# Patient Record
Sex: Female | Born: 1975
Health system: Western US, Academic
[De-identification: ages and names within clinical notes are randomized; demographics above are authoritative.]

---

## 2013-04-28 DIAGNOSIS — Q201 Double outlet right ventricle: Secondary | ICD-10-CM

## 2013-04-28 DIAGNOSIS — Z950 Presence of cardiac pacemaker: Secondary | ICD-10-CM

## 2013-04-28 DIAGNOSIS — Z9889 Other specified postprocedural states: Secondary | ICD-10-CM

## 2013-04-28 DIAGNOSIS — Q204 Double inlet ventricle: Secondary | ICD-10-CM

## 2013-04-28 DIAGNOSIS — I27849 S/P Fontan procedure: Secondary | ICD-10-CM

## 2013-04-28 DIAGNOSIS — Z8774 Personal history of (corrected) congenital malformations of heart and circulatory system: Secondary | ICD-10-CM

## 2017-02-05 ENCOUNTER — Ambulatory Visit: Payer: BLUE CROSS/BLUE SHIELD

## 2017-03-12 ENCOUNTER — Ambulatory Visit: Payer: PRIVATE HEALTH INSURANCE

## 2017-03-19 ENCOUNTER — Ambulatory Visit: Payer: BLUE CROSS/BLUE SHIELD

## 2017-03-19 ENCOUNTER — Telehealth: Payer: BLUE CROSS/BLUE SHIELD

## 2017-03-20 ENCOUNTER — Inpatient Hospital Stay: Payer: BLUE CROSS/BLUE SHIELD | Attending: Pediatric Cardiology

## 2017-03-20 ENCOUNTER — Telehealth: Payer: BLUE CROSS/BLUE SHIELD

## 2017-03-21 ENCOUNTER — Ambulatory Visit: Payer: BLUE CROSS/BLUE SHIELD

## 2017-03-25 ENCOUNTER — Telehealth: Payer: BLUE CROSS/BLUE SHIELD

## 2017-04-08 MED ORDER — SPIRONOLACTONE 25 MG PO TABS
ORAL_TABLET | 3 refills | Status: SS
Start: 2017-04-08 — End: 2017-08-09

## 2017-04-16 ENCOUNTER — Ambulatory Visit: Payer: BLUE CROSS/BLUE SHIELD

## 2017-04-24 ENCOUNTER — Inpatient Hospital Stay: Payer: BLUE CROSS/BLUE SHIELD | Attending: Pediatric Cardiology

## 2017-05-04 ENCOUNTER — Ambulatory Visit: Payer: BLUE CROSS/BLUE SHIELD

## 2017-05-06 ENCOUNTER — Ambulatory Visit: Payer: BLUE CROSS/BLUE SHIELD

## 2017-05-07 ENCOUNTER — Ambulatory Visit: Payer: BLUE CROSS/BLUE SHIELD

## 2017-05-07 ENCOUNTER — Ambulatory Visit: Payer: BLUE CROSS/BLUE SHIELD | Attending: Gynecology

## 2017-05-07 ENCOUNTER — Ambulatory Visit: Payer: BLUE CROSS/BLUE SHIELD | Attending: Cardiovascular Disease

## 2017-05-07 DIAGNOSIS — N9089 Other specified noninflammatory disorders of vulva and perineum: Secondary | ICD-10-CM

## 2017-05-07 DIAGNOSIS — Z9889 Other specified postprocedural states: Secondary | ICD-10-CM

## 2017-05-07 DIAGNOSIS — Z87410 Personal history of cervical dysplasia: Secondary | ICD-10-CM

## 2017-05-07 DIAGNOSIS — Z30431 Encounter for routine checking of intrauterine contraceptive device: Secondary | ICD-10-CM

## 2017-05-17 ENCOUNTER — Ambulatory Visit: Payer: BLUE CROSS/BLUE SHIELD

## 2017-05-21 ENCOUNTER — Ambulatory Visit: Payer: BLUE CROSS/BLUE SHIELD

## 2017-05-21 DIAGNOSIS — I2721 Secondary pulmonary arterial hypertension: Secondary | ICD-10-CM

## 2017-05-21 MED ORDER — SILDENAFIL CITRATE 20 MG PO TABS
20 mg | ORAL_TABLET | Freq: Three times a day (TID) | ORAL | 3 refills | Status: AC
Start: 2017-05-21 — End: 2018-06-09

## 2017-05-22 ENCOUNTER — Inpatient Hospital Stay: Payer: BLUE CROSS/BLUE SHIELD | Attending: Pediatric Cardiology

## 2017-06-13 ENCOUNTER — Telehealth: Payer: BLUE CROSS/BLUE SHIELD

## 2017-06-19 ENCOUNTER — Inpatient Hospital Stay: Payer: BLUE CROSS/BLUE SHIELD | Attending: Pediatric Cardiology

## 2017-07-09 ENCOUNTER — Telehealth: Payer: BLUE CROSS/BLUE SHIELD

## 2017-07-24 ENCOUNTER — Inpatient Hospital Stay: Payer: BLUE CROSS/BLUE SHIELD | Attending: Pediatric Cardiology

## 2017-08-06 NOTE — H&P
[facility]    Reason for Visit: Routine follow up for DORV s/p Fontan conversion to extracardiac baffle on 11/21/07, and subsequent placement of epicardial permanent pacemaker.     HPI: Ann Duran is a 41 y.o. woman with the following cardiac history:  1. 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  2. Underwent modified Fontan procedure (patch connection of the IVC/SVC via the right atrium to MPA) at age 89 yrs at the [facility].  3. Postoperative chylothorax [phone], subsequently requiring exploration by a left thoracotomy, with ligation of lymphatic vessel and thoracic duct.  4. Suffered fatigue and exercise intolerance in November 2008, and was found to be in atrial fibrillation.  5. Underwent cardioversion at [facility] by Dr. Charlesetta Garibaldi on 10/14/07, along with cardiac cath which revealed excellent RA/Fontan pressures with a mean of 12-25mmHg.  6. Underwent Fontan conversion on 11/21/07, involving takedown of previous Fontan and placement of an extracardiac Fontan and bidirectional Glenn shunt and placement of epicardial permanent pacing leads. Discharged on 11/29/07  7. Re-admitted on 12/03/07 for thoracentesis of large right pleural effusion, in the setting of bradycardia and junctional rhythm  8. Underwent surgical placement of abdominal pacemaker generator on 12/05/07, but atrial lead was non-capturing, left with single site ventricular pacing.  9. Discharged on 12/12/07, after aggressive diuresis in the setting of significant ascites and perineal edema.  10. Subsequent diuresis over the next 6 weeks with resolution of ascites on moderate dose lasix and moderate dose aldactone.  11. underwent left hemidiaphragm plication by Dr. Larwance Sachs on 03/02/2009, unsuccessful attempt at placement of an atrial lead.  12. PPM rate set at VVI 90 bpm during the hospitalization, reduced to 60 bpm by Dr. Carollee Herter on 03/29/2009. 13. In the setting of poor chronotropic response to exercise, rate response activated by Dr. Carollee Herter in July 2011 with significantly improved exercise ability.  14. Underwent placement of Mirena IUD and D & C by Dr. Ardyth Man Parvatenini in February 2012. No cardiovascular complications.  Mirena IUD replaced with LEEP procedure in January 2018  15. Good functional capacity with MVO2 of 24.8 (88% predicted) in August 2016.  Low normal single RV function, improves with exercise.  No exercise induced arrhythmias  16. V-paced 99% of time at rate of 70, generator nearing ERI as of May 2018 remote check, requiring monthly checks to detect ERI.     INTERVAL HISTORY: Ann Duran was last seen on 05/2017. At that time she was doing well. She is now being scheduled for a routine Fontan surveillance cath. Her last cath was in 2008. She is otherwise doing well. She has no issues. She is tolerating the increased sildenafil dose without issues.       Past Medical History: As above.  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 Darlyn Chamber here at [facility].  LEEP in Jan 2018.  Mirena IUD replaced in Jan 2018.  Nose bleeds s/p bipolar cauterization 10/2016    Allergies: No Known Allergies     Medications:   No outpatient prescriptions have been marked as taking for the 08/08/17 encounter [facility] Encounter).         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   Nonsmoker, occas ETOH.    FH: Mother and father in their 1s in good health. Siblings, she has1 brother who is in good health. There  is no history of congenital heart disease in the family.    Ros: Negative except as described above.    Physical Exam:  There were no vitals taken for this visit.  General: Pleasant thin woman, in NAD.  Skin: No rashes, no plethora. Sternotomy and left thoracotomy scars well healed. Head and Neck: Pupils are equal and reacting. Mouth: Pale oral mucosa. Excellent dentition  Neck:Jugular venous pressure not well seen.   Chest:  Lungs clear bilaterally   Heart: Single S1, Single s2, regular rate and rhythm, with no systolic murmurs or rubs. No diastolic murmurs or gallops.  Abdomen: Not distended, tympanic to percussion, nontender, liver not enlarged, no ascites noted.  Extremities: No edema, mild varicosity on inner thighs and lower right leg.  Neurologic: Alert and oriented x 4.  Psychologic: Normal mood and affect.      Imaging Data:  Echocardiogram:  05/07/17 PHYSICIAN INTERPRETATION:  CONOTRUNCAL ANATOMY: The cardiac structural malformations are consistent with a diagnosis of levo-transposition of the great arteries.  LEFT VENTRICLE: Visually estimated left ventricular ejection fraction 55-60%. Patient demonstrated normal sinus rhythm during echocardiogram. 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  RIGHT VENTRICLE: Double out right ventricle with hypoplastic LV and large VSD with '' functional single ventricle'' Right ventricular hypertrophy. Low normal systemic RV systolic function.  RIGHT ATRIUM: Right atrial pressure is estimated at 8 mmHg. Right atrial pressure is estimated at 8 mmHg. Large secundum ASD. Status post extracardiac Fontan. Visualized portions of the Fontan appear patent, no obvious fenestration noted. Sherrine Maples shunt   appears normal.  MITRAL VALVE: No evidence of mitral valve regurgitation.  TRICUSPID VALVE: Mild tricuspid valve regurgitation.  AORTIC VALVE: The aortic valve was not well visualized. Aorta is anterior of the pulmonic valve.  No evidence of aortic regurgitation.  PULMONIC VALVE: No indication of pulmonic valve regurgitation. Pulmonic is posterior of the aorta.  AORTA: Normal aortic arch and normal descending aorta. The ascending aorta was not well visualized. Mildly enlarged aortic root (4.2 cm). No coarctation of the aorta. PULMONARY ARTERY: The pulmonary artery is not well seen.  SYSTEMIC VEINS: The inferior vena cava is normal in size and exhibits less than 50% respiratory change.  PERICARDIUM: There is no evidence of pericardial effusion.  CONCLUSIONS:   1. L-transposition of the great arteries.   2. Double outlet right ventricle-doubly commited ventricular septal defect-aorta anterior to pulmonary artery-no left ventricular outflow tract obstruction.   3. 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 Low normal systemic RV systolic function.   4. Large secundum ASD. Status post extracardiac Fontan. Visualized portions of the Fontan appear patent, no obvious fenestration noted. Sherrine Maples shunt appears normal.   5. Left ventricular ejection fraction is approximately 55-60%.   6. Mildly enlarged aortic root (4.2 cm).   7. Mild tricuspid valve regurgitation.   8. A prior echo performed on 03/09/2016 was reviewed for comparison. No significant changes noted since the previous study.   9. 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     ECG 11/07/16: Ventricular paced. Ventricular rate of 75 bpn, QRS duration 184 ms. QT/QTc 498/556 ms    Liver Ultrasound 07/27/16 per report from OSH: Liver 17.5 cm in length. It is normal in echodensity. Liver contour is smooth. No focal lesion os identified. Hepatopedal flow is identifies in the main, right, and left portal veins. Gallbladder and biliary tree: There are several small mobile gallstones. There is no focal tenderness.  The gallbladder wall is thickened measuring 0.7 cm. There is no pericholecystic fluid. No intra or extrahepatic biliary ductal dilatation.      Echocardiogram 03/09/16: CONOTRUNCAL ANATOMY: The cardiac structural malformations are consistent with a diagnosis of levo-transposition of the great arteries. The observed structural malformations are consistent with the diagnosis of double outlet right ventricle with doubly???commited ventricular septal defect. The aorta is anterior to the pulmonary artery. There is no sub-aortic obstruction of left ventricular outflow tract. LEFT VENTRICLE: Normal left ventricular size. Visually estimated left ventricular ejection fraction 55-60%. Small left ventricular size. RIGHT VENTRICLE: (DTI 6.0 cm/s). 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 Low normal systemic RV systolic function. LEFT ATRIUM: Mild left atrial enlargement. RIGHT ATRIUM: Right atrial pressure is estimated at 8 mmHg. Large secundum ASD. Status post extracardiac Fontan. Visualized portions of the Fontan appear patent, no obvious fenestration noted. Sherrine Maples shunt appears normal. MITRAL VALVE: No evidence of mitral valve regurgitation. TRICUSPID VALVE: Mild tricuspid valve regurgitation. AORTIC VALVE: The aortic valve is trileaflet and structurally normal, with normal leaflet excursion. Aorta is anterior of the pulmonic valve. No evidence of aortic regurgitation. PULMONIC VALVE: No indication of pulmonic valve regurgitation. Pulmonic is posterior of the aorta. AORTA: Normal mid ascending aorta and normal aortic arch. Mildly enlarged aortic root (3.9 cm). SYSTEMIC VEINS: The inferior vena cava is normal in size and exhibits less than 50% respiratory change. PERICARDIUM: There is no evidence of pericardial effusion. ?????? CONCLUSIONS: ???1. 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 Low normal systemic RV systolic function. ???2. Left ventricular ejection fraction is approximately 55-60%. ???3. Double outlet right ventricle. ???4. Double outlet right ventricle ????????? -doubly commited ventricular septal defect ????????? -aorta anterior to pulmonary artery ????????? -no left ventricular outflow tract obstruction. ???5. L-transposition of the great arteries. ???6. Mild left atrial enlargement. ???7. Large secundum ASD. Status post extracardiac Fontan. Visualized portions of the Fontan appear patent, no obvious fenestration noted. Sherrine Maples shunt appears normal. ???8. Mild tricuspid valve regurgitation. ???9. Mildly enlarged aortic root (3.9 cm).    Abdominal US 06/21/15:  The pancreas is partially visualized and grossly unremarkable. The liver is mildly heterogeneous in echogenicity. There is no focal liver lesion. Portal vein demonstrates hepatopetal flow. No intra- or extrahepatic biliary dilation. The common bile duct is normal in caliber. The gallbladder is normally distended, containing gallstones. No gallbladder wall thickening or pericholecystic fluid. No sonographic Murphy's sign. The spleen is mildly enlarged. The kidneys are normal in size. Both kidneys demonstrate normal cortical thickness and echogenicity. No hydronephrosis. There is no ascites. The visualized proximal aorta and IVC are unremarkable. MEASUREMENTS: Liver: 15.1 cm Common Duct: 3 mm Right Kidney: 10.5 cm Left Kidney: 11.5 cm Spleen: 17 cm Aorta: 1.1 cm SHEAR WAVE LIVER STIFFNESS MEASUREMENTS: Mean 1.63 +/-0.12 m/sec, Median 1.66 m/sec, equating to 5.7-12 kPA. REFERENCE (m/s, kPa): Normal: 0.81-1.22 m/s 2.0-4.5 kPa (METAVIR F0) Normal/mild: 1.22-1.37 m/s 4.5-5.7 kPa (METAVIR F0-F1) Mild/moderate: 1.37-2 m/s 5.7-12.0 kPa (METAVIR F2-F3) Moderate/severe: 2-2.64 m/s 12.0-21.0 kPa (METAVIR F3-F4) Severe: >2.64 m/s >21.0 kPa (METAVIR F4) IMPRESSION: 1. Heterogenous liver echogenicity, suggesting diffuse liver disease. No suspicious liver lesions. Patent hepatic vasculature. 2. Shear wave liver stiffness measurement indicating mild/moderate liver fibrosis, MetaVir score of F2-3. 3. Splenomegaly. 4. Cholelithiasis without evidence of acute cholecystitis.    Echocardiogram 06/21/15:  2D AND M-MODE MEASUREMENTS (normal ranges within parentheses): Aorta/Left Atrium: Aortic Root,  d (2D): 2.8 cm LV DIASTOLIC FUNCTION: MV Peak E: 4.54 m/s E/e' Ratio: 40.2 LV IVRT: 134 msec Decel Time: 229 msec Right Ventricle: TAPSE: 1.0 cm Aortic Valve: AoV Max Vel: 1.05 m/s AoV Peak PG: 4 mmHg AoV Mean PG: Tricuspid Valve and PA/RV Systolic Pressure: TR Max Velocity: 3.2 m/s RA Pressure: 8 mmHg RVSP/PASP: 49 mmHg Pulmonic Valve: PV Max Velocity: 0.8 m/s PV Max PG: 2 mmHg PV Mean PG: Aorta: Ao Asc: 2.4 cm. PHYSICIAN INTERPRETATION: CONOTRUNCAL ANATOMY: The cardiac structural malformations are consistent with a diagnosis of levo-transposition of the great arteries. 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 The aorta is anterior to the pulmonary artery. There is no sub-aortic obstruction of left ventricular outflow tract. LEFT VENTRICLE: Visually estimated left ventricular ejection fraction 55-60%. Small left ventricular size. RIGHT VENTRICLE: TAPSE 1.0 cm, (DTI 6.0 cm/s). 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 Low normal systemic RV systolic function. RIGHT ATRIUM: Right atrial pressure is estimated at 8 mmHg. Large secundum ASD. Status post extracardiac Fontan. Visualized portions of the Fontan appear patent, no obvious fenestration noted. Unable to obtain images of Glenn shunt. MITRAL VALVE: No evidence of mitral valve regurgitation. TRICUSPID VALVE: Mild tricuspid valve regurgitation. Tricuspid regurgitation velocity is not well seen. AORTIC VALVE: The aortic valve is trileaflet and structurally normal, with normal leaflet excursion. Aorta is anterior of the pulmonic valve. No evidence of aortic regurgitation. PULMONIC VALVE: No indication of pulmonic valve regurgitation. Pulmonic is posterior of the aorta. AORTA: The aortic root is normal in size and structure, normal mid ascending aorta and normal aortic arch. SYSTEMIC VEINS: The inferior vena cava is not well visualized. PERICARDIUM: There is no evidence of pericardial effusion. CONCLUSIONS: 1. Small left ventricular size 2. 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 3. L-transposition of the great arteries. 4. Mild tricuspid valve regurgitation. 5. 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 Low normal systemic RV systolic function.     CPEX/Echo 06/11/15:  BASELINE: Low normal systemic RV systolic function at rest. Hypoplastic LV. ADDITIONAL BASELINE FINDINGS: MITRAL VALVE: Trace mitral valve regurgitation. TRICUSPID VALVE: Trace tricuspid valve regurgitation. No new segmental wall motion abnormalities were seen. Global left ventricular systolic function at peak stress is normal (LVEF 60-65%). Improved systemic RV systolic function with stress. No increase in degree of atrioventricular valve regurgitation with exercise. SUMMARY: 1. Improved systemic RV systolic function with stress. No increase in degree of atrioventricular valve regurgitation with exercise. 2. Low normal systemic RV systolic function at rest. Hypoplastic LV. BASELINE ECG: Ventricular paced rhythm PROTOCOL: Bruce. CONTROL: HR: 74 BP: 120/74 O2 Sat: 95 % START: 1.7 MPH at 10% grade. STOP: 12.7 METS with 4.2 mph at 16 % grade x 1???33'' after 10???33'' total exercise time due to shortness of breath. Peak HR: 141 Peak BP: 136/56 Peak O2 Sat: 95 % Peak Double-Product: 19,176 Maximum VO2 = 1386 ml which is 77 % of predicted, maximum VO2/kg = 24.8 ml/kg which is 88 % of predicted (Note: The reference values are adjusted for weight and modality of exercise) VE/VO2 = 35 VE/VCO2 = 31 VE/VCO2 slope = 23.1 VO2/HR = 9.8 which is 99 % predicted Breathing Reserve = 50 RQ= 1.13 Anaerobic Threshold was reached at HR =  103 VO2 = 1094 ml VO2/kg = 19.6 ml/kg RESULTS: Symptoms: Shortness of breath and leg fatigue; no chest pain or discomfort. ST-T Changes: Uninterpretable due to paced rhythm. Dysrhythmias: Rare PVC during exercise BP Response: Appropriate. IMPRESSIONS: Good exercise tolerance achieving 77 % maximum predicted heart rate, limited by shortness of breath. Exercise induced ST changes are uninterpretable due to paced rhythm. No exercise induced chest pain at a low double product. Rare PVCs as described above. Maximum VO2 = 1386 ml which is 77 % of predicted, maximum VO2/kg = 24.8 ml/kg which is 88 % of predicted . VE/VCO2 slope = 23.1 which is 92 % of predicted. Compared with previous stress/CPX of 04/24/13 maximum VO2/kg had been 25.0 at 11.9 METS    Echocardiogram:  04/21/13: CONCLUSIONS:  1. Double outlet right ventricle.  2. Moderately enlarged right ventricular size and low normal systolic function. 3. Moderately increased RV wall thickness. 4. Mild tricuspid regurgitation. 5. Status post extracardiac Fontan, appears widely patent, no fenestration noted. Sherrine Maples shunt not well visualized.    SE/CPX:  04/21/13: BASELINE ECG: Ventricular paced rhythm. PROTOCOL: Bruce. CONTROL: HR: 77 BP: 106/76 O2 Sat: 99%.  START: 1.7 MPH at 10% grade. STOP: 11.9 METS with 4.2 mph at 16% grade x 1'09'' after 10'09'' total exercise time due to shortness of breath. Peak HR: 151 Peak BP: 144/80 Peak O2 Sat: 95%. Peak Double-Product: 21,744.  Maximum VO2 = 1404 ml, maximum VO2/kg = 25.1 ml/kg which is 88% of predicted (Note: The reference values are adjusted for weight and modality of exercise - normal weight). VE/VO2 = 37, VE/VCO2 = 34, VE/VCO2 slope = 24.8 (99% predicted), VO2/HR = 9.5 ml which is 95% predicted, Breathing Reserve = 47%, RQ=1.10. Anaerobic threshold was reached at 6' exercise, HR =108, VO2 = 1098 ml, VO2/kg = 19.6 ml/kg. RESULTS:  Symptoms: Shortness of breath, leg fatigue; denied lightheadedness prior to and during exercise. ST-T Changes: No significant changes. Dysrhythmias: None. BP Response: Appropriate.  IMPRESSIONS: Good exercise tolerance, NYHA functional class I (MVO2 = 25.1 ml/kg, 88% predicted), achieving 81% maximum predicted heart rate, limited by shortness of breath. No diagnostic ST changes in the presence of ventricular paced rhythm. No chest pain or dysrhythmias at a moderate double product. VE/VCO2 slope = 24.8 which is 99% of predicted. Compared with previous stress/CPX of 04/22/2012, increased exercise tolerance from 10.1 METS, MVO2 increased from 23.3 ml/kg. RIGHT VENTRICLE: . Global RV systolic function is low normal. Estimated right ventricular ejection fraction is 50%. POST STRESS: Global left ventricular function increased appropriately with stress. No new segmental wall motion abnormalities were seen. Estimated right ventricular ejection fraction is 60 %. SUMMARY: 1. Appropriate improvement in right ventricular systolic function with stress.    CPET-echo 04/22/12:  BASELINE: Double outlet right ventricle with hypoplastic left vetnricle and large VSD with double inlet functional singel ventricle of RV morphology. Resting EF estimated at 45-50%. ADDITIONAL BASELINE FINDINGS: MITRAL VALVE: Trace mitral valve regurgitation. TRICUSPID VALVE: Trace tricuspid regurgitation. No new segmental wall motion abnormalities were seen. Global left ventricular systolic function at peak stress is imporved (RVEF>50%). Patent Fontan, mild atrioventricular valve regurgitation, normal aortic flow.BASELINE ECG: Ventricular paced rhythm. PROTOCOL: Bruce. CONTROL: HR: 76 BP: 120/80 O2 Sat: 94% START: 1.7 MPH at 10% grade. STOP: 10.10 METS with 3.4 mph at 14 % grade x 3??? after 9??? total exercise time due to shortness of breath and leg fatigue Peak HR: 137 Peak BP: 136/74 Peak O2 Sat: 95% Peak Double-Product: 18,632.  Maximum VO2 = 1323  ml, maximum VO2/kg = 23.3 ml/kg which is 80 % of predicted (Note: The reference values are adjusted for weight and modality of exercise). VE/VO2 = 34 VE/VCO2 = 30 VE/VCO2 slope = 24.7.  VO2/HR =9.8 which is 97 %predicted Breathing Reserve = 55 RQ= 1.13.  Anaerobic Threshold was reached at HR = 97 VO2 = 1141 ml VO2/kg = 20.1 ml/kg after of exercise.  RESULTS: Symptoms: Shortness of breath and leg fatigue; denied chest pain or discomfort. ST-T Changes: No significant changes. Dysrhythmias: None.  BP Response: Appropriate. IMPRESSIONS: Good exercise tolerance achieving 72 % maximum predicted heart rate, limited by shortness of breath and leg fatigue. No diagnostic ST change in the presence of baseline paced rhythm. No chest pain or dysrhythmias at a low double product. Maximum VO2/kg = 23.3 ml/kg which is 80 % of predicted. VE/VCO2 slope = 24.7 which is 100 % of predicted. Compared with previous stress/CPX of 04/13/2011, exercise tolerance are Unchanged. MVO2 decreased from 26.6 ml/kg.    Echocardiogram: 04/11/11: FINDINGS :1. Double outlet right ventricle with hypoplastic left ventricle and large VSD consistent with ''functionally single right ventricle'' with double inlet. Right ventricular hypertrophy ; estimated right ventricular ejection fraction = 50% 2. Extracardiac Fontan not well visualized but Doppler interrogation of the IVC demonstrates normal venous flow pattern with inspiratory accentuation, the SVC Sherrine Maples) is not well visualized. 3. Normal atrial sizes with a large ASD 4.  Aorta is anterior with a peak aortic valve velocity 0.9 m/sec. No aortic regurgitation. 5. Normal bilateral atrioventricular valves with normal motion . E velocity 63 cm/sec; peak A velocity 29 cm/sec; Isovolumicrelaxation time 109 msec. Deceleration time 229 msec. No left atrioventricular valve regurgitation, mild right atrioventricular valve regrugitation. 6. No pericardial effusion.  CONCLUSIONS. 1. Double outlet right ventricle with transposed great arteries and large VSD s/p extracardiac Fontan conversion. 2. Stable ventricular systolic function, estimated ejection fraction is 50%, unchanged from prior. 3. Mild right atrioventricular valve regurgitation.    Stress Echocardiogram: 04/11/11: PRE-EXERCISE STUDY REVEALS: Single right ventricle with normal wall motion. ESTIMATED RESTING RVEF = 50% POST-EXERCISE STUDY REVEALS: Improved systolic function with exercise. ESTIMATED RVEF = 60%. CONCLUSION: Appropriate single ventricular response to stress, no evidence of stress induced Ischemia.  CPX treadmill stress testing: 04/11/11: BASELINE ECG: Ventricular paced rhytm PROTOCOL: Bruce. CONTROL: HR: 79 BP: 102/62 O2 Sat: 93 % START: 1.7 MPH at 10% grade. STOP: 10.10 METS with 3.4 mph at 14 % grade x 3' after 9' total exercise time due to shortness of breath and leg fatigue Peak HR: 116 Peak BP: 130/68 Peak O2 Sat: 94% Peak Double-Product: 15,080 Maximum VO2 = 1477 ml, maximum VO2/kg = 26.6 ml/kg which is 91 % of predicted (Note: The reference values are adjusted for weight and modality of exercise) VE/VO2 = 32 VE/VCO2 = 32 VE/VCO2 slope = 23.2 VO2/HR =12.7 which is 127 %predicted Breathing Reserve = 55 RQ= 0.99 Anaerobic Threshold was reached at a VO2/kg of 25.7 ml/kg, HR of 116 bpm after 8 mins and 40 seconds of exercise. RESULTS: Symptoms: 4/10 head pounding which relieved by 4' recovery, shortness of breath and leg fatigue; denied chest pain or lightheadedness ST-T Changes: Uninterpretable due to paced rhythm. Dysrhythmias: None BP Response: Appropriate. IMPRESSIONS: Fair exercise tolerance achieving 62 % maximum predicted heart rate, limited by shortness of breath and leg fatigue. No diagnostic ST changes in the presence of baseline pace rhythm. No chest pain or dysrhythmias at a low double product. Maximum VO2/kg = 26.6 ml/kg which is 91 %  of predicted. VE/VCO2 slope = 23.2 which is 94 % of predicted. Compared with previous stress/CPX of 04/21/2010 exercise duration slightly decreased and MVO2 increased from 20.7 ml/kg.    ECHOCARDIOGRAM 04/21/2010: FINDINGS 1. Double outlet right ventricle with hypoplastic left ventricle and large VSD consistent with ''functionally single ventricle'' with double inlet; estimated right ventricular ejection fraction = 50%. Anterior aorta with normal arch. Posterior hypoplastic and oversewn pulmonary artery; venous flow profile in right pulmonary artery. 2. Lateral tunnel Fontan partially visualized. Normal superior vena cava flow profile (though superior vena cava to pulmonary artery connection not well seen); 3. Normal atrial sizes with large ASD. 4. Aorta is anterior Peak aortic valve velocity 1.4 m/sec. No aortic regurgitation. 5. Normal bilateral atrioventricular valves with normal motion, Mild right atrioventricular valve regurgitation. Trace left atrioventricular valve regurgitation. 6. No pericardial effusion. CONCLUSIONS. 1. Double outlet right  ventricle with single ventricle physiology. 2. Stable right ventricular systolic function. 3. Mild atrioventricular valve regurgitation. 4. Fontan not well seen.    STRESS ECHO/CPX 04/21/2010: BASELINE ECG: Ventricular paced PROTOCOL: Bruce. CONTROL: HR: 80 BP: 116/76 O2 Sat: 95% START: 1.7 MPH at 10% grade. STOP: 10.5 METS with 4.2 mph at 16% grade x 0.25' after 9.25' total exercise time due to shortness of breath. Peak HR: 86 Peak BP: 144/86 Peak O2 Sat: 96% Peak Double-Product: 12,384 Maximum VO2 = 1177 ml, maximum VO2/kg = 20.7 ml/kg which is 70 % of predicted (Note: The reference values are adjusted for weight and modality of exercise) VE/VO2 = 40, VE/VCO2 = 35, VE/VCO2 slope = 29 (118% predicted), VO2/HR = 13.7 ml which is 134% predicted, Breathing Reserve = 56, RQ = 1.15 Anaerobic threshold was reached at 5.5' exercise, HR = 85, VO2 = 989 ml, VO2/kg = 17.4 ml/kg. RESULTS: Symptoms: Shortness of breath, leg fatigue. ST-T Changes: No significant  changes. Dysrhythmias: Rare PVCs before exercise. BP Response: Appropriate. IMPRESSIONS: Good exercise tolerance achieving 46% maximum predicted heart rate, limited by shortness of breath. No diagnostic ST changes in the presence of baseline paced rhythm. No chest pain at a low double product. Rare PVCs before exercise. Maximum VO2/kg = 20.7 ml/kg which is 70% of predicted. VE/VCO2 slope = 29 which is 118% of predicted (abnormal). Compared with previous stress/CPX of 10/05/2009, MVO2 is increased from 18.9 ml/kg. Pre-Exercise Study Reveals: Mild global hypokinesis, mild right atrioventricular valve regurgitation, Trace left atrioventricular valve regurgitation ESTIMATED RESTING EF = 45-50% Post-Exercise Study Reveals: No new or worsening wall motion abnormalities, ESTIMATED EF = > 50% Conclusion: Appropriate increase in ejection fraction with exercise.    Pacemaker Interrogation: 05/30/2010 - Intrinsic rhythm: Junctional rhythm 43 bpm R wave: 10.0 mV Battery: 2.79 V Battery Impedance: 180 ohms Ventricular lead impedance:599 ohms Ventricular capture threshold: 1.25 V @ 0.52 msec Event summary since 11/22/09: Vs: 1.0% Vp: 99.0% Programmed Changes Lower Rate: 60 ppm ==> 70 ppm Upper Sensor Rate: 115 ppm ==> 150 ppm Sleep Rate: 55 ppm Rate Response: ADL Rate: 85 ppm ==> 90 ppm ADL Response: 4 ==> 5  Exertion Response: 3 ==> 4 ADLR Percent: 4.0% ==> 7.0% High Rate Percent: 0.2% ==> 0.5% ADL Rate Setpoint: 8 ==> 5  Upper Rate Setpoint: 125 ==> 121 Sleep Rate: 45 ppm ==> 55 ppm Programmed parameters: Mode: VVIR Rate: 60 / 115 ppm Sleep Rate: 55 ppm Bedtime: 2200 hours to 0600 hours Output: 3.0 V @ 0.52 msec Sensitivity: 1.40 mV Assessment: Patient is not pacemaker-dependent at this time. Normal pacemaker function.    Cath 10/14/2007:  HEMODYNAMICS:  1. No gradients across pulmonary arteries or in Fontan conduit. 2. Low transpulmonary gradient ( ).  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  2) Atrial fibrillation s/p successful cardioversion to sinus rhythm with first degree AV block and likely sick  sinus syndrome.  3) Favorable Fontan hemodynamics with low transpulmonary gradient and low pulmonary capillary wedge  Pressures.      LABWORK:      07/26/16: WBC 3.0, HGB 4.69, HCT 40.7, PLT 75, NA 137, K 3.8, BUN 13, CREAT 0.7, ALK PHOS 57, AST 30, ALT 38, TBil 1.3, GGT 100, PREALB 24, BNP 113, INR 1.2, TSH 4.24, FT3 3.6, FT4 1.05.  04/01/13:  Hgb 14.7, Hct 42.4, MCV 85.1, plat ct 106k, Na 143, K 4.2, creat 0.8, BUN 19, AST 36, ALT 32    Impressions:  1. DORV (although recent echos appear to show a DILV with single LVEF 50%, and earlier surgical notes describe her anatomy as DILV), L-malposed great arteries: s/p RA-PA anastomosis Fontan at age 53 years, now s/p Extracardiac Fontan and bidirectional Sherrine Maples shunt on 11/21/07 by Dr. Richardo Hanks, including bi-atrial Maze procedure and placement of a permanent atrial pacing lead.  2. Postoperative junctional rhythm and bradycardia: re-admitted 4 days after discharge with large right pericardial effusion requiring thoracentesis and placement of an abdominal pacemaker generator and epicardial ventricular lead on 12/05/07 (non-functioning atrial lead).  3. Ascites, improved on aldactone. Intermittent abdominal bloating depending on salt intake/stress, but no evidence of increased volume load or ascites on exam today.  4. Longstanding history of irregular and heavy menses, thus far unsuccessfully regulated on several types of hormonal therapy. Better controlled on Mirena since 2012.  Also has cervical HPV.  Now with CIN III, underwent LEEP procedure in Jan 2018  5. History of atrial fibrillation, but no post Fontan revision tachyarrhythmias thus far, formerly on coumadin (possible side effect of hair loss), now on ASA.  6. Longstanding elevated left hemidiapragm, s/p successful plication on 03/02/2009 by Dr. Larwance Sachs.  7. Liver US in 2014, 2016 and 2017 shows no lesions  8. Ventricular pacemaker, pacing 99% of time, nearing ERI as of May 2018.    Recommendations:   1. Proceed with RLHC, TEE, Liver biopsy      Estimated Risk of Major Adverse Events after Catheterization in [facility] Patients         Points Received For:  Age 44-40    1  Pre-procedural O2 sat <96%    2    Total Points: 3  Estimated Risk of MAE: 1-2%    A.C. Stefanescu Noreene Filbert et al. Prediction of adverse events after catheter-based procedures in adolescents and adults with congenital heart disease in the IMPACT registry. European Heart Journal (2017) 38, 2070???2076.      The patient was seen and discussed with the [facility] Attending, Dr. Tobie Poet    Author:  Monika Salk, MD, MPH  08/06/2017  1:56 PM    I have seen and examined the patient and I agree with the findings, assessment, and recommendations made above which we developed together and communicated with the patient.    CC:   Dr. Elmer Picker (please fax)  Dr. Danna Hefty  Dr. Franki Monte

## 2017-08-08 ENCOUNTER — Ambulatory Visit: Payer: BLUE CROSS/BLUE SHIELD

## 2017-08-08 ENCOUNTER — Inpatient Hospital Stay
Admit: 2017-08-08 | Discharge: 2017-08-09 | Disposition: A | Discharge status: Home / Self Care | Payer: BLUE CROSS/BLUE SHIELD | Source: Home / Self Care | Attending: Cardiovascular Disease

## 2017-08-08 DIAGNOSIS — Z9889 Other specified postprocedural states: Secondary | ICD-10-CM

## 2017-08-08 DIAGNOSIS — Q201 Double outlet right ventricle: Secondary | ICD-10-CM

## 2017-08-08 DIAGNOSIS — K740 Hepatic fibrosis: Secondary | ICD-10-CM

## 2017-08-08 DIAGNOSIS — Q204 Double inlet ventricle: Secondary | ICD-10-CM

## 2017-08-08 DIAGNOSIS — Z950 Presence of cardiac pacemaker: Secondary | ICD-10-CM

## 2017-08-08 LAB — WBC Count, ANES: WHITE BLOOD CELL COUNT: 3.7 10*3/uL — ABNORMAL LOW (ref 4.16–9.95)

## 2017-08-08 LAB — CREATININE: CREATININE: 0.72 mg/dL (ref 0.60–1.30)

## 2017-08-08 LAB — Sodium: SODIUM: 145 mmol/L (ref 135–146)

## 2017-08-08 LAB — Sodium,POC: SODIUM,POC: 138 mmol/L (ref 135–146)

## 2017-08-08 LAB — Total CO2: TOTAL CO2: 20 mmol/L (ref 20–30)

## 2017-08-08 LAB — Hematocrit: HEMATOCRIT: 42.2 (ref 34.9–45.2)

## 2017-08-08 LAB — CO2,POC: CO2,POC: 21 mmol/L (ref 20–30)

## 2017-08-08 LAB — Blood Gases, arterial,POC: PH, ARTERIAL,POC: 7.36 — ABNORMAL LOW (ref 7.37–7.41)

## 2017-08-08 LAB — APTT: APTT: 25.8 s (ref 21.9–29.3)

## 2017-08-08 LAB — Pregnancy Test,Urine: PREGNANCY TEST,URINE: NEGATIVE

## 2017-08-08 LAB — COOXIMETRY,POC: OXYHEMOGLOBIN,POC: 91.8 (ref 11.6–15.2)

## 2017-08-08 LAB — Chloride,POC: CHLORIDE,POC: 106 mmol/L (ref 96–106)

## 2017-08-08 LAB — Hemoglobin: HEMOGLOBIN: 13.7 g/dL (ref 11.6–15.2)

## 2017-08-08 LAB — Urea Nitrogen: UREA NITROGEN: 12 mg/dL (ref 7–22)

## 2017-08-08 LAB — Chloride: CHLORIDE: 102 mmol/L (ref 96–106)

## 2017-08-08 LAB — Glucose, Whole Blood: GLUCOSE, WHOLE BLOOD: 93 mg/dL (ref 65–99)

## 2017-08-08 LAB — Ionized Calcium,POC: IONIZED CA,CORR,POC: 1.15 mmol/L (ref 1.09–1.29)

## 2017-08-08 LAB — Potassium,POC: POTASSIUM,POC: 3.8 mmol/L (ref 3.6–5.3)

## 2017-08-08 LAB — Platelet Count: MEAN PLATELET VOLUME: 11.7 fL (ref 9.3–13.0)

## 2017-08-08 LAB — Calcium,Ionized: IONIZED CA++,UNCORRECTED: 1.18 mmol/L (ref 1.09–1.29)

## 2017-08-08 LAB — Glucose,POC: GLUCOSE,POC: 92 mg/dL (ref 65–99)

## 2017-08-08 LAB — Potassium: POTASSIUM: 3.8 mmol/L (ref 3.6–5.3)

## 2017-08-08 LAB — Prothrombin Time Panel: INR: 1.2 s (ref 8.5–11.1)

## 2017-08-08 MED ORDER — FUROSEMIDE 80 MG PO TABS
80 mg | ORAL_TABLET | Freq: Every day | ORAL | 3 refills | Status: AC
Start: 2017-08-08 — End: 2017-09-10

## 2017-08-08 MED ORDER — SPIRONOLACTONE 50 MG PO TABS
50 mg | ORAL_TABLET | Freq: Every day | ORAL | 3 refills | Status: AC
Start: 2017-08-08 — End: 2017-08-22

## 2017-08-08 MED ADMIN — PLASMA-LYTE A IV SOLN: INTRAVENOUS | @ 15:00:00 | Stop: 2017-08-08 | NDC 00338022104

## 2017-08-08 MED ADMIN — PLASMA-LYTE A IV SOLN: INTRAVENOUS | @ 18:00:00 | Stop: 2017-08-08

## 2017-08-08 MED ADMIN — PHENYLEPHRINE HCL 10 MG/ML IJ SOLN: INTRAVENOUS | @ 16:00:00 | Stop: 2017-08-08 | NDC 00641614225

## 2017-08-08 MED ADMIN — IODIXANOL 320 MG/ML IV SOLN: 160 mL | INTRAVENOUS | @ 18:00:00 | Stop: 2017-08-08 | NDC 00407222321

## 2017-08-08 MED ADMIN — ONDANSETRON HCL 4 MG/2ML IJ SOLN: INTRAVENOUS | @ 17:00:00 | Stop: 2017-08-08 | NDC 00641608025

## 2017-08-08 MED ADMIN — IODIXANOL 320 MG/ML IV SOLN: 10 mL | INTRAVENOUS | @ 17:00:00 | Stop: 2017-08-08 | NDC 00407222316

## 2017-08-08 MED ADMIN — ACETAMINOPHEN 10 MG/ML IV SOLN: @ 17:00:00 | Stop: 2017-08-08 | NDC 43825010201

## 2017-08-08 MED ADMIN — OXYCODONE HCL 5 MG PO TABS: 5 mg | ORAL | @ 20:00:00 | Stop: 2017-08-09 | NDC 00406055262

## 2017-08-08 MED ADMIN — PHENYLEPHRINE HCL 10 MG/ML IJ SOLN: INTRAVENOUS | @ 17:00:00 | Stop: 2017-08-08 | NDC 00641614225

## 2017-08-08 MED ADMIN — LIDOCAINE HCL (CARDIAC) 20 MG/ML IV SOLN: INTRAVENOUS | @ 15:00:00 | Stop: 2017-08-08 | NDC 00409132305

## 2017-08-08 MED ADMIN — ROCURONIUM BROMIDE 50 MG/5ML IV SOLN: INTRAVENOUS | @ 15:00:00 | Stop: 2017-08-08 | NDC 39822420002

## 2017-08-08 MED ADMIN — MIDAZOLAM HCL 10 MG/10ML IJ SOLN: INTRAVENOUS | @ 15:00:00 | Stop: 2017-08-08 | NDC 00409258705

## 2017-08-08 MED ADMIN — ROCURONIUM BROMIDE 50 MG/5ML IV SOLN: INTRAVENOUS | @ 16:00:00 | Stop: 2017-08-08 | NDC 39822420002

## 2017-08-08 MED ADMIN — FENTANYL CITRATE (PF) 100 MCG/2ML IJ SOLN: INTRAVENOUS | @ 15:00:00 | Stop: 2017-08-08 | NDC 00409909422

## 2017-08-08 MED ADMIN — OXYCODONE HCL 5 MG PO TABS: 10 mg | ORAL | @ 21:00:00 | Stop: 2017-08-09 | NDC 00406055262

## 2017-08-08 MED ADMIN — PROMETHAZINE IVPB: 6.25 mg | INTRAVENOUS | @ 18:00:00 | Stop: 2017-08-09 | NDC 39822552503

## 2017-08-08 MED ADMIN — PROPOFOL 200 MG/20ML IV EMUL: @ 15:00:00 | Stop: 2017-08-08 | NDC 63323026929

## 2017-08-08 MED ADMIN — SUGAMMADEX SODIUM 200 MG/2ML IV SOLN: @ 17:00:00 | Stop: 2017-08-08 | NDC 00006542312

## 2017-08-08 MED ADMIN — PHENYLEPHRINE HCL 10 MG/ML IJ SOLN: INTRAVENOUS | @ 15:00:00 | Stop: 2017-08-08 | NDC 00641614225

## 2017-08-08 MED ADMIN — DEXAMETHASONE SODIUM PHOSPHATE 4 MG/ML IJ SOLN: INTRAVENOUS | @ 15:00:00 | Stop: 2017-08-08 | NDC 63323016501

## 2017-08-08 NOTE — Brief Op Note
Brief Operative/Procedure Note    Patient: Ann Duran    Date of Operation(s)/Procedure(s): 08/08/2017    Pre-op Diagnosis: DORV (double outlet right ventricle) [Q20.1]  S/P Fontan procedure [Z98.890]  Pacemaker [Z95.0]  Liver fibrosis [K74.0]       Post-op Diagnosis: Fontan procedure, in a patient with DORV, L-TGA.     Operation(s)/Procedure(s):  RIGHT-LEFT HEART CATH  COIL EMBOLIZATION    Surgeon(s) and Role:     * Rashay Barnette, Duaine Dredge., MD - Primary    Anesthesia Staff and Role:  Anesthesiologist: Madison Hickman., MD    Anesthesia Type:   Anesthesia type not filed in the log.    Pre-Op Medications: None    Estimated Blood Loss: Minimal    Findings:   -Mildly elevated Fontan pressures ( ). No obstruction within the Fontan circuit.  -No evidence of portal hypetension (transhepatic wedge 1 mmHg)  -Normal LVEDP (9 mmHg)  -no gradient across the aortic valve  -minimal shunting with agitated saline study in LPA and RPA.   -large veno-venous collateral from innominate vein to coronary sinus.   -s/p liver biopsy    Complications: None; patient tolerated the operation(s)/procedure(s) well.                 Specimens:   ID Type Source Tests Collected by Time   1 :  Tissue Liver TISSUE EXAM/FOREIGN BODY (AP) Tiffiny Worthy A., MD 08/08/2017 1006     Drains:  None     Recommendations:  -continue all medications  -will increase spironolactone to 50mg  daily         Staff and Role:   Radiology Technologist: Aline August.  NICU MD: Monika Salk, MD, MPH    DWA Dr. Tobie Poet    Author:  Monika Salk, MD, MPH    Date: 08/08/2017  Time: 10:18 AM    The above procedural note accurately details the procedure performed, including the findings and complications.  I have reviewed the procedural note and I agree with the contents.  I supervised the performance of this procedure and was scrubbed in and present throughout the procedure.

## 2017-08-08 NOTE — Op Note
1045: received pt from Cath lab, awake, alert, no pain. Right neck dressing has a little blood stain, marked the site. Right groin dressing is dry and intact, pedal pulses are strong.    1135: noticed right   Neck stain is getting bigger, pressure applied.    1145: released manual pressure, notified cath lab.    1200: dr.Small is at bedside, ok to change neck dressing. Pt refused visitor.

## 2017-08-08 NOTE — H&P
UPDATED H&P REQUIREMENT    For [place] [facility] and [facility]    WHAT IS THE STATUS OF THE PATIENT'S MOST CURRENT HISTORY AND PHYSICAL?   - The most current H&P was performed within the past 24 hours. No additional updated H&P documentation is necessary.     REFER TO MEDICAL STAFF POLICIES REGARDING PRE-PROCEDURE HISTORY AND PHYSICAL EXAMINATION AND UPDATED H&P REQUIREMENTS BELOW:    [facility] and [facility] Medical Staff Policy 200 - For Patients Undergoing Procedures Requiring Moderate or Deep Sedation, General Anesthesia or Regional Anesthesia    Contents of a History and Physical Examination (H&P):    The H&P shall consist of chief complaint, history of present illness, allergies and medications, relevant social and family history, past medical history, review of systems and physical examination, and assessment and plan appropriate to the patient's age.    For Patients Undergoing Procedures Requiring Moderate or Deep Sedation, General Anesthesia or Regional Anesthesia:    1. An H&P shall be performed within 24 hours prior to the procedure by a qualified member of the medical staff or designee with appropriate privileges, except as noted in item 2 below.    2. If a complete history and physical was performed within thirty (30) calendar days prior to the patient's admission to the Medical Center for elective surgery, a member of the medical staff assumes the responsibility for the accuracy of the clinical information and will need to document in the medical record within twenty-four (24) hours of admission and prior to surgery or major invasive procedure, that they either attest that the history and physical has been reviewed and accepted, or document an update of the original history and physical relevant to the patient's current clinical status.    3. Providing an H&P for  patients undergoing surgery under local anesthesia is at the discretion of the Attending Physician.     4. When a procedure is performed by a dentist, podiatrist or other practitioner who is not privileged to perform an H&P, the anesthesiologist's assessment immediately prior to the procedure will constitute the 24 hour re-assessment.The dentist, podiatrist or other practitioner who is not privileged to perform an H&P will document the history and physical relevant to the procedure.    5. If the H&P and the written informed consent for the surgery or procedure are not recorded in the patient's medical record prior to surgery, the operation shall not be performed unless the attending physician states in writing that such a delay could lead to an adverse event or irreversible damage to the patient.    6. The above requirements shall not preclude the rendering of emergency medical or surgical care to a patient in dire circumstances.

## 2017-08-08 NOTE — Op Note
PACU NursingTransfer/Discharge Note  12:14 PM, 08/08/2017    * No procedures listed *  DORV (double outlet right ventricle) [Q20.1]  S/P Fontan procedure [Z98.890]  Pacemaker [Z95.0]  Liver fibrosis [K74.0]  @RRTREATTEAM @    Past Medical History:   Diagnosis Date   ??? Anxiety     controlled per pt   ??? Cholelithiases     Noted on ultrasound, asymptomatic    ??? Delayed emergence from general anesthesia 2010    pacemaker insertion, per patient   ??? DORV (double outlet right ventricle)    ??? GERD (gastroesophageal reflux disease)     well controlled w/med and diet   ??? History of blood transfusion    ??? HPV (human papilloma virus) infection    ??? IUD (intrauterine device) in place     Mirena placed 12/27/10, replaced 12/2015, replaced November 16, 2016   ??? Pacemaker     in abd     Past Surgical History:   Procedure Laterality Date   ??? CERVICAL BIOPSY  W/ LOOP ELECTRODE EXCISION  11/16/2016    Also, Mirena IUD replacement as well as excision of vulvar skin   ??? Diaphragmatic plication  2010   ??? Exam under anesthesia; hysteroscopy; dilation and curettage; placement of a Mirena intrauterine device  12/27/10   ??? FONTAN PROCEDURE  1982    Modified   ??? INSERT / REPLACE / REMOVE PACEMAKER  2009    Revise Fontan, Maze procedure       (THE FOLLOWING IS FOR NURSING REFERENCE AND HAS BEEN PULLED FROM THE CURRENT CHART)  Lines, Drains, and Airways     Wound              Incision 08/08/17 Right Groin less than 1 day      Incision 08/08/17 Right Neck less than 1 day                 Pain Information (Last Filed)     Score Location Comments Edu?    Patient Asleep None None None        Last Recorded Vital Signs:    08/08/17 1200   BP:    Pulse: 73   Resp: 18   Temp:    SpO2: 92%     Temp Readings from Last 1 Encounters:   08/08/17 36.7 ???C (98 ???F) (Tympanic)     Peripheral IV Left Hand (Active)       Peripheral IV 20 G Left Forearm (Active)       Peripheral IV 18 G Right Hand (Active)     No intake/output data recorded.  I/O this shift: In: 460 [I.V.:410; IV Piggyback:50]  Out: -       Recent Labs      08/08/17   0903  08/08/17   0653   WBC   --   3.70*   HCT   --   42.2   HGB   --   13.7   PLT   --   67*   PT   --   11.7*   APTT   --   25.8   INR   --   1.2   NA   --   145   K   --   3.8   CL   --   102   CO2   --   20   BUN   --   12   CREAT   --   0.72  GLUCOSE   --   93   ICALCOR   --   1.18   GLUCOSEPOC  92   --          -------------------------------------------------------------------------------------------------------------------  PACU RN NOTES:    Discharge from: [facility] (discharge from PTU)    Received antiemetics in PACU? Yes - see MAR 1100    Antibiotics received? No     Acetaminophen last given at: 1020    Last pain medication: None    Pain level: Acceptable for patient: Yes    Does the patient use prescription pain medication at home (prior to admission)? no    Respiratory is at baseline? Yes    Blood Pressure Management in PACU: No Issues    Release from Anesthesia Order? Yes    At least 45 minutes since last dose of Flumazenil, Narcan or Anesthesia reversal medication? Yes    Aldrete: 9     Surgical Site: Intact and Dry or with minimal drainage     Activity: Has not ambulated    Tolerating liquids:  Undetermined / Not attempted    Voided:  Due to void    Prescription faxed to outpatient pharmacy: No discharge prescription    Responsible Adult: Maddies Room    Pending orders and Continuing Care Issues from OR / PACU: d/c at 1630

## 2017-08-08 NOTE — Op Note
Hand  Off report from [place] at 13:05, pt alert, oriented, v/s stable, right neck and right groin dressing intact, c/o lower back pain, 5/10 oxycodone 5 mg po given, family at bedside, will keep monitor.

## 2017-08-09 DIAGNOSIS — N871 Moderate cervical dysplasia: Secondary | ICD-10-CM

## 2017-08-09 LAB — Tissue Exam

## 2017-08-14 ENCOUNTER — Ambulatory Visit: Payer: BLUE CROSS/BLUE SHIELD

## 2017-08-20 ENCOUNTER — Inpatient Hospital Stay: Payer: PRIVATE HEALTH INSURANCE | Attending: Pediatric Cardiology

## 2017-08-21 ENCOUNTER — Ambulatory Visit: Payer: BLUE CROSS/BLUE SHIELD | Attending: Pediatric Cardiology

## 2017-08-21 ENCOUNTER — Ambulatory Visit: Payer: BLUE CROSS/BLUE SHIELD | Attending: Cardiovascular Disease

## 2017-08-21 ENCOUNTER — Inpatient Hospital Stay: Payer: BLUE CROSS/BLUE SHIELD | Attending: Pediatric Cardiology

## 2017-08-21 ENCOUNTER — Ambulatory Visit: Payer: BLUE CROSS/BLUE SHIELD

## 2017-08-21 DIAGNOSIS — I495 Sick sinus syndrome: Secondary | ICD-10-CM

## 2017-08-21 DIAGNOSIS — Z950 Presence of cardiac pacemaker: Secondary | ICD-10-CM

## 2017-08-21 DIAGNOSIS — Q204 Double inlet ventricle: Secondary | ICD-10-CM

## 2017-08-21 MED ORDER — SPIRONOLACTONE 50 MG PO TABS
100 mg | ORAL_TABLET | Freq: Every day | ORAL | 3 refills | Status: AC
Start: 2017-08-21 — End: 2017-09-10

## 2017-08-21 NOTE — Progress Notes
[facility]    Reason for Visit: Hospital follow up s/p cardiac catheterization on 08/08/17, in setting of DORV s/p Fontan conversion to extracardiac baffle on 11/21/07, and subsequent placement of epicardial permanent pacemaker.     HPI: Audery is a 41 y.o. woman with the following cardiac history:  1. 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  2. Underwent modified Fontan procedure (patch connection of the IVC/SVC via the right atrium to MPA) at age 62 yrs at the [facility].  3. Postoperative chylothorax [phone], subsequently requiring exploration by a left thoracotomy, with ligation of lymphatic vessel and thoracic duct.  4. Suffered fatigue and exercise intolerance in November 2008, and was found to be in atrial fibrillation.  5. Underwent cardioversion at [facility] by Dr. Charlesetta Garibaldi on 10/14/07, along with cardiac cath which revealed excellent RA/Fontan pressures with a mean of 12-96mmHg.  6. Underwent Fontan conversion on 11/21/07, involving takedown of previous Fontan and placement of an extracardiac Fontan and bidirectional Glenn shunt and placement of epicardial permanent pacing leads. Discharged on 11/29/07  7. Re-admitted on 12/03/07 for thoracentesis of large right pleural effusion, in the setting of bradycardia and junctional rhythm  8. Underwent surgical placement of abdominal pacemaker generator on 12/05/07, but atrial lead was non-capturing, left with single site ventricular pacing.  9. Discharged on 12/12/07, after aggressive diuresis in the setting of significant ascites and perineal edema.  10. Subsequent diuresis over the next 6 weeks with resolution of ascites on moderate dose lasix and moderate dose aldactone.  11. underwent left hemidiaphragm plication by Dr. Larwance Sachs on 03/02/2009, unsuccessful attempt at placement of an atrial lead.  12. PPM rate set at VVI 90 bpm during the hospitalization, reduced to 60 bpm by Dr. Carollee Herter on 03/29/2009.  13. In the setting of poor chronotropic response to exercise, rate response activated by Dr. Carollee Herter in July 2011 with significantly improved exercise ability.  14. Underwent placement of Mirena IUD and D & C by Dr. Ardyth Man Parvatenini in February 2012. No cardiovascular complications.  Mirena IUD replaced with LEEP procedure in January 2018  15. Good functional capacity with MVO2 of 24.8 (88% predicted) in August 2016.  Low normal single RV function, improves with exercise.  No exercise induced arrhythmias  16. V-paced 99% of time at rate of 70, generator nearing ERI as of May 2018 remote check, requiring monthly checks to detect ERI.   17. Liver US in Sept 2017 showed no lesions but METAVIR score was 4 consistent with cirrhosis.  18. Underwent cardiac cath on 08/08/17 by Dr. Tobie Poet, showing mean Fontan pressures of , and liver biopsy showing extensive bridging fibrosis, but no cirrhosis.  Spirinolactone dose was doubled from 25mg  daily to 50mg  daily in an attempt to reduce Fontan volume pressure.    INTERVAL HISTORY: Adel returns today for hospital follow up after undergoing cardiac cath on 08/08/17.   She called our office complaining of late bruising in right groin and a contact burn behind her right neck.  Today she states the burn has resolved, possibly an irritant from the betadine scrub at time of procedure.   The bruise in right groin is also resolving but dark purple 3 inches below puncture site and covering width of thigh.  She feels less abdominal bloating since increasing spirinolactone two weeks ago.   She is traveling to [place] this weekend for work, and then plans to celebrate her 41th birthday in November.  Past Medical History: As above.  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 Darlyn Chamber here at [facility].  LEEP in Jan 2018.  Mirena IUD replaced in Jan 2018.  Nose bleeds s/p bipolar cauterization 10/2016    Allergies: No Known Allergies     Medications:   Current Outpatient Prescriptions:   ???  alprazolam 0.5 mg tablet, Take 0.25 mg by mouth at bedtime as needed., Disp: , Rfl:   ???  Ascorbic Acid (VITAMIN C) 1000 MG tablet, Take 1,000 mg by mouth as needed for ., Disp: , Rfl:   ???  aspirin (ASPIRIN) 81 mg EC tablet, Take 81 mg by mouth daily, Disp: , Rfl:   ???  cyanocobalamin 1000 mcg tablet, Take 1,000 mcg by mouth as needed for ., Disp: , Rfl:   ???  fluticasone 50 mcg/act nasal spray, instill 1 spray into each nostril once daily, Disp: , Rfl: 0  ???  furosemide 80 mg tablet, Take 1 tablet (80 mg total) by mouth daily Take 2 tabs in the morning, take 1 tabs in the evening. (Patient taking differently: Take 80 mg by mouth daily .), Disp: 90 tablet, Rfl: 3  ???  ibuprofen (ADVIL) 200 mg tablet, Take 400 mg by mouth every six (6) hours as needed., Disp: , Rfl:   ???  Omega-3 Fatty Acids (FISH OIL) 500 mg CAPS capsule, Take 500 mg by mouth as needed for., Disp: , Rfl:   ???  pantoprazole 20 mg DR tablet, Take 1 tablet (20 mg total) by mouth daily as needed. (Patient taking differently: Take 20 mg by mouth as needed for .), Disp: 90 tablet, Rfl: 3  ???  potassium chloride 10 MEQ tablet, Take 2 tablets (20 mEq total) by mouth two (2) times daily. (Patient taking differently: Take 20 mEq by mouth daily .), Disp: 360 tablet, Rfl: 3  ???  sildenafil 20 mg tablet, Take 1 tablet (20 mg total) by mouth three (3) times daily., Disp: 270 tablet, Rfl: 3  ???  spironolactone 50 mg tablet, Take 1 tablet (50 mg total) by mouth daily., Disp: 90 tablet, Rfl: 3  ???  valacyclovir 1000 mg tablet, ValACYclovir HCl - 1 GM Oral Tablet, Disp: , Rfl:   ???  valacyclovir 500 mg tablet, ValACYclovir HCl - 500 MG Oral Tablet, Disp: , Rfl:   ???  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   Nonsmoker, occas ETOH.    FH: Mother and father in their 50s in good health. Siblings, she has1 brother who is in good health. There is no history of congenital heart disease in the family.    Ros: Negative except as described above.    Physical Exam:  VS: BP 124/81  ~ Pulse 95  ~ Temp 36.9 ???C (98.4 ???F) (Oral)  ~ Resp 16  ~ Ht 5' 5'' (1.651 m)  ~ Wt 128 lb 1.6 oz (58.1 kg)  ~ LMP  (LMP Unknown)  ~ SpO2 93% Comment: RA ~ BMI 21.32 kg/m???   General: Pleasant thin woman, in NAD.  Skin: No rashes. Sternotomy and left thoracotomy scars well healed.  Head and Neck: Pupils are equal and reacting.   Mouth: Normal oral mucosa. Excellent dentition  Neck:Jugular venous pressure ~12cm  Chest:  Lungs clear  bilaterally   Heart: Single S1 with no systolic murmurs or rubs. No diastolic murmurs or gallops.  Abdomen: Not distended, tympanic to percussion, nontender, liver not enlarged, no ascites noted.  Extremities: No edema, mild varicosity on inner thighs and lower right leg. Resolving bruise at right upper thigh  Neurologic: Alert and oriented x 4.  Psychologic: Normal mood and affect.      Imaging Data:  Cardiac Cath:  08/08/17      Liver US  07/01/17 (outside report) shows no lesions, Metavir score of 4.  Echocardiogram:  05/07/17 PHYSICIAN INTERPRETATION:  CONOTRUNCAL ANATOMY: The cardiac structural malformations are consistent with a diagnosis of levo-transposition of the great arteries.  LEFT VENTRICLE: Visually estimated left ventricular ejection fraction 55-60%. Patient demonstrated normal sinus rhythm during echocardiogram. 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  RIGHT VENTRICLE: Double out right ventricle with hypoplastic LV and large VSD with '' functional single ventricle'' Right ventricular hypertrophy. Low normal systemic RV systolic function.  RIGHT ATRIUM: Right atrial pressure is estimated at 8 mmHg. Right atrial pressure is estimated at 8 mmHg. Large secundum ASD. Status post extracardiac Fontan. Visualized portions of the Fontan appear patent, no obvious fenestration noted. Sherrine Maples shunt   appears normal.  MITRAL VALVE: No evidence of mitral valve regurgitation.  TRICUSPID VALVE: Mild tricuspid valve regurgitation.  AORTIC VALVE: The aortic valve was not well visualized. Aorta is anterior of the pulmonic valve.  No evidence of aortic regurgitation.  PULMONIC VALVE: No indication of pulmonic valve regurgitation. Pulmonic is posterior of the aorta.  AORTA: Normal aortic arch and normal descending aorta. The ascending aorta was not well visualized. Mildly enlarged aortic root (4.2 cm). No coarctation of the aorta.  PULMONARY ARTERY: The pulmonary artery is not well seen.  SYSTEMIC VEINS: The inferior vena cava is normal in size and exhibits less than 50% respiratory change.  PERICARDIUM: There is no evidence of pericardial effusion.  CONCLUSIONS:   1. L-transposition of the great arteries.   2. Double outlet right ventricle-doubly commited ventricular septal defect-aorta anterior to pulmonary artery-no left ventricular outflow tract obstruction.   3. 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 Low normal systemic RV systolic function.   4. Large secundum ASD. Status post extracardiac Fontan. Visualized portions of the Fontan appear patent, no obvious fenestration noted. Sherrine Maples shunt appears normal.   5. Left ventricular ejection fraction is approximately 55-60%.   6. Mildly enlarged aortic root (4.2 cm).   7. Mild tricuspid valve regurgitation.   8. A prior echo performed on 03/09/2016 was reviewed for comparison. No significant changes noted since the previous study.   9. 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     ECG 11/07/16: Ventricular paced. Ventricular rate of 75 bpn, QRS duration 184 ms. QT/QTc 498/556 ms    Liver Ultrasound 07/27/16 per report from OSH: Liver 17.5 cm in length. It is normal in echodensity. Liver contour is smooth. No focal lesion os identified. Hepatopedal flow is identifies in the main, right, and left portal veins. Gallbladder and biliary tree: There are several small mobile gallstones. There is no focal tenderness. The gallbladder wall is thickened measuring 0.7 cm. There is no pericholecystic fluid. No intra or extrahepatic biliary ductal dilatation.      Echocardiogram 03/09/16: CONOTRUNCAL ANATOMY: The cardiac structural malformations are consistent with a diagnosis of levo-transposition of the great arteries. The observed structural malformations are consistent with the diagnosis of double outlet  right ventricle with doubly???commited ventricular septal defect. The aorta is anterior to the pulmonary artery. There is no sub-aortic obstruction of left ventricular outflow tract. LEFT VENTRICLE: Normal left ventricular size. Visually estimated left ventricular ejection fraction 55-60%. Small left ventricular size. RIGHT VENTRICLE: (DTI 6.0 cm/s). 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 Low normal systemic RV systolic function. LEFT ATRIUM: Mild left atrial enlargement. RIGHT ATRIUM: Right atrial pressure is estimated at 8 mmHg. Large secundum ASD. Status post extracardiac Fontan. Visualized portions of the Fontan appear patent, no obvious fenestration noted. Sherrine Maples shunt appears normal. MITRAL VALVE: No evidence of mitral valve regurgitation. TRICUSPID VALVE: Mild tricuspid valve regurgitation. AORTIC VALVE: The aortic valve is trileaflet and structurally normal, with normal leaflet excursion. Aorta is anterior of the pulmonic valve. No evidence of aortic regurgitation. PULMONIC VALVE: No indication of pulmonic valve regurgitation. Pulmonic is posterior of the aorta. AORTA: Normal mid ascending aorta and normal aortic arch. Mildly enlarged aortic root (3.9 cm). SYSTEMIC VEINS: The inferior vena cava is normal in size and exhibits less than 50% respiratory change. PERICARDIUM: There is no evidence of pericardial effusion. ?????? CONCLUSIONS: ???1. 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 Low normal systemic RV systolic function. ???2. Left ventricular ejection fraction is approximately 55-60%. ???3. Double outlet right ventricle. ???4. Double outlet right ventricle ????????? -doubly commited ventricular septal defect ????????? -aorta anterior to pulmonary artery ????????? -no left ventricular outflow tract obstruction. ???5. L-transposition of the great arteries. ???6. Mild left atrial enlargement. ???7. Large secundum ASD. Status post extracardiac Fontan. Visualized portions of the Fontan appear patent, no obvious fenestration noted. Sherrine Maples shunt appears normal. ???8. Mild tricuspid valve regurgitation. ???9. Mildly enlarged aortic root (3.9 cm).    Abdominal US 06/21/15:  The pancreas is partially visualized and grossly unremarkable. The liver is mildly heterogeneous in echogenicity. There is no focal liver lesion. Portal vein demonstrates hepatopetal flow. No intra- or extrahepatic biliary dilation. The common bile duct is normal in caliber. The gallbladder is normally distended, containing gallstones. No gallbladder wall thickening or pericholecystic fluid. No sonographic Murphy's sign. The spleen is mildly enlarged. The kidneys are normal in size. Both kidneys demonstrate normal cortical thickness and echogenicity. No hydronephrosis. There is no ascites. The visualized proximal aorta and IVC are unremarkable. MEASUREMENTS: Liver: 15.1 cm Common Duct: 3 mm Right Kidney: 10.5 cm Left Kidney: 11.5 cm Spleen: 17 cm Aorta: 1.1 cm SHEAR WAVE LIVER STIFFNESS MEASUREMENTS: Mean 1.63 +/-0.12 m/sec, Median 1.66 m/sec, equating to 5.7-12 kPA. REFERENCE (m/s, kPa): Normal: 0.81-1.22 m/s 2.0-4.5 kPa (METAVIR F0) Normal/mild: 1.22-1.37 m/s 4.5-5.7 kPa (METAVIR F0-F1) Mild/moderate: 1.37-2 m/s 5.7-12.0 kPa (METAVIR F2-F3) Moderate/severe: 2-2.64 m/s 12.0-21.0 kPa (METAVIR F3-F4) Severe: >2.64 m/s >21.0 kPa (METAVIR F4) IMPRESSION: 1. Heterogenous liver echogenicity, suggesting diffuse liver disease. No suspicious liver lesions. Patent hepatic vasculature. 2. Shear wave liver stiffness measurement indicating mild/moderate liver fibrosis, MetaVir score of F2-3. 3. Splenomegaly. 4. Cholelithiasis without evidence of acute cholecystitis.    Echocardiogram 06/21/15:  2D AND M-MODE MEASUREMENTS (normal ranges within parentheses): Aorta/Left Atrium: Aortic Root, d (2D): 2.8 cm LV DIASTOLIC FUNCTION: MV Peak E: 1.61 m/s E/e' Ratio: 40.2 LV IVRT: 134 msec Decel Time: 229 msec Right Ventricle: TAPSE: 1.0 cm Aortic Valve: AoV Max Vel: 1.05 m/s AoV Peak PG: 4 mmHg AoV Mean PG: Tricuspid Valve and PA/RV Systolic Pressure: TR Max Velocity: 3.2 m/s RA Pressure:  8 mmHg RVSP/PASP: 49 mmHg Pulmonic Valve: PV Max Velocity: 0.8 m/s PV Max PG: 2 mmHg PV Mean PG: Aorta: Ao Asc: 2.4 cm. PHYSICIAN INTERPRETATION: CONOTRUNCAL ANATOMY: The cardiac structural malformations are consistent with a diagnosis of levo-transposition of the great arteries. 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 The aorta is anterior to the pulmonary artery. There is no sub-aortic obstruction of left ventricular outflow tract. LEFT VENTRICLE: Visually estimated left ventricular ejection fraction 55-60%. Small left ventricular size. RIGHT VENTRICLE: TAPSE 1.0 cm, (DTI 6.0 cm/s). 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 Low normal systemic RV systolic function. RIGHT ATRIUM: Right atrial pressure is estimated at 8 mmHg. Large secundum ASD. Status post extracardiac Fontan. Visualized portions of the Fontan appear patent, no obvious fenestration noted. Unable to obtain images of Glenn shunt. MITRAL VALVE: No evidence of mitral valve regurgitation. TRICUSPID VALVE: Mild tricuspid valve regurgitation. Tricuspid regurgitation velocity is not well seen. AORTIC VALVE: The aortic valve is trileaflet and structurally normal, with normal leaflet excursion. Aorta is anterior of the pulmonic valve. No evidence of aortic regurgitation. PULMONIC VALVE: No indication of pulmonic valve regurgitation. Pulmonic is posterior of the aorta. AORTA: The aortic root is normal in size and structure, normal mid ascending aorta and normal aortic arch. SYSTEMIC VEINS: The inferior vena cava is not well visualized. PERICARDIUM: There is no evidence of pericardial effusion. CONCLUSIONS: 1. Small left ventricular size 2. 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 3. L-transposition of the great arteries. 4. Mild tricuspid valve regurgitation. 5. 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 Low normal systemic RV systolic function.     CPEX/Echo 06/11/15:  BASELINE: Low normal systemic RV systolic function at rest. Hypoplastic LV. ADDITIONAL BASELINE FINDINGS: MITRAL VALVE: Trace mitral valve regurgitation. TRICUSPID VALVE: Trace tricuspid valve regurgitation. No new segmental wall motion abnormalities were seen. Global left ventricular systolic function at peak stress is normal (LVEF 60-65%). Improved systemic RV systolic function with stress. No increase in degree of atrioventricular valve regurgitation with exercise. SUMMARY: 1. Improved systemic RV systolic function with stress. No increase in degree of atrioventricular valve regurgitation with exercise. 2. Low normal systemic RV systolic function at rest. Hypoplastic LV. BASELINE ECG: Ventricular paced rhythm PROTOCOL: Bruce. CONTROL: HR: 74 BP: 120/74 O2 Sat: 95 % START: 1.7 MPH at 10% grade. STOP: 12.7 METS with 4.2 mph at 16 % grade x 1???33'' after 10???33'' total exercise time due to shortness of breath. Peak HR: 141 Peak BP: 136/56 Peak O2 Sat: 95 % Peak Double-Product: 19,176 Maximum VO2 = 1386 ml which is 77 % of predicted, maximum VO2/kg = 24.8 ml/kg which is 88 % of predicted (Note: The reference values are adjusted for weight and modality of exercise) VE/VO2 = 35 VE/VCO2 = 31 VE/VCO2 slope = 23.1 VO2/HR = 9.8 which is 99 % predicted Breathing Reserve = 50 RQ= 1.13 Anaerobic Threshold was reached at HR = 103 VO2 = 1094 ml VO2/kg = 19.6 ml/kg RESULTS: Symptoms: Shortness of breath and leg fatigue; no chest pain or discomfort. ST-T Changes: Uninterpretable due to paced rhythm. Dysrhythmias: Rare PVC during exercise BP Response: Appropriate. IMPRESSIONS: Good exercise tolerance achieving 77 % maximum predicted heart rate, limited by shortness of breath. Exercise induced  ST changes are uninterpretable due to paced rhythm. No exercise induced chest pain at a low double product. Rare PVCs as described above. Maximum VO2 = 1386 ml which is 77 % of predicted, maximum VO2/kg = 24.8 ml/kg which is 88 % of predicted . VE/VCO2 slope = 23.1 which is 92 % of predicted. Compared with previous stress/CPX of 04/24/13 maximum VO2/kg had been 25.0 at 11.9 METS    Echocardiogram:  04/21/13: CONCLUSIONS:  1. Double outlet right ventricle.  2. Moderately enlarged right ventricular size and low normal systolic function. 3. Moderately increased RV wall thickness. 4. Mild tricuspid regurgitation. 5. Status post extracardiac Fontan, appears widely patent, no fenestration noted. Sherrine Maples shunt not well visualized.    LABWORK:    08/08/17  Creat 0.72, BUN 12, K 3.8 07/01/17  Chol 121, trig 58, HDL 40, LDL 69, Hgb A1c 5.4, INR 1.2, prealbumin 24, TSH 3.6, Hct 43.2, Plat 82k  07/26/16: WBC 3.0, HGB 4.69, HCT 40.7, PLT 75, NA 137, K 3.8, BUN 13, CREAT 0.7, ALK PHOS 57, AST 30, ALT 38, TBil 1.3, GGT 100, PREALB 24, BNP 113, INR 1.2, TSH 4.24, FT3 3.6, FT4 1.05.  04/01/13:  Hgb 14.7, Hct 42.4, MCV 85.1, plat ct 106k, Na 143, K 4.2, creat 0.8, BUN 19, AST 36, ALT 32    Impressions:  1. DORV (although recent echos appear to show a DILV with single LVEF 50%, and earlier surgical notes describe her anatomy as DILV), L-malposed great arteries: s/p RA-PA anastomosis Fontan at age 26 years, now s/p Extracardiac Fontan and bidirectional Sherrine Maples shunt on 11/21/07 by Dr. Richardo Hanks, including bi-atrial Maze procedure and placement of a permanent atrial pacing lead.  2. Postoperative junctional rhythm and bradycardia: re-admitted 4 days after discharge with large right pericardial effusion requiring thoracentesis and placement of an abdominal pacemaker generator and epicardial ventricular lead on 12/05/07 (non-functioning atrial lead).  3. Ascites, improved on aldactone. Intermittent abdominal bloating depending on salt intake/stress, but no evidence of ascites on annual abdominal US.  4. Longstanding history of irregular and heavy menses, thus far unsuccessfully regulated on several types of hormonal therapy. Better controlled on Mirena since 2012.  Also has cervical HPV.  Now with CIN III, underwent LEEP procedure in Jan 2018  5. History of atrial fibrillation, but no post Fontan revision tachyarrhythmias thus far, formerly on coumadin (possible side effect of hair loss), now on ASA.  6. Longstanding elevated left hemidiapragm, s/p successful plication on 03/02/2009 by Dr. Larwance Sachs.  7. Liver US in 2014, 2016 and 2017, and 2018 shows no lesions, but 2017 and 2018 outside liver US report Metavir score of 4, which is consistent with cirrhosis 8. Cardiac cath and liver biopsy on 08/08/17, with mean Fontan pressure under sedation of , and liver biopsy showing extensive bridging fibrosis but no cirrhosis.  Spirinolactone was increased from 25 to 50mg  after this cath  9. Ventricular pacemaker, pacing 99% of time, nearing ERI as of May 2018.  Anticipate new lead placement when generator is replaced.     Recommendations:   1. Reassured Roisin that her right groin bruise is resolving and within the realm of normal post cath procedure.  We also discussed the cath hemodynamics and indicated that we should strive to bring down the Fontan pressure with optimal doses of spirinolactone.  She will get updated BMP in the coming days at [facility], and will increase her spirinolactone dose from 50mg  to 100mg  if her labs are stable.   We will then  repeat BMP a week after increasing dose to 100mg .    2. Continue ASA for her Fontan circuit, furosemide 80 daily as she is currently taking it as it is keeping her euvolemic, sildenafil to decrease PVR and improve pulmonary blood flow.  We may try an ERA at some point in the future.      3. Continue regular exercise as tolerated.    4. Continue regular pacemaker surveillance, with monthly remote checks as she nears ERI.  Will likely need generator replacement in coming year, with likely lead replacement.   We discussed the option of using transvenous leads in her LPA, with screw in lead to roof of atrium, but this would require anticoagulation longterm.   She was reluctant to consider this, since she bruises readily with ASA.  We gave her samples of eliquis 5mg  to take twice a day as a tester dose to see how she tolerates this, as a replacement to her ASA.   This can help her decide if transvenous leads are an option, since she otherwise will likely require full sternotomy to place new epicardial leads..  5. Continue follow-up with Dr. Renato Battles, as previously scheduled 6. In the absence of interval symptoms, follow up in one year with echo, liver US, and labwork.     Patient was examined and discussed with  Dr. Dyanne Iha.  CC:  Dr. Elmer Picker  [facility]    Labs (BMP, BNP, LFTs, GGT, AFP, INR, CBC, Prealbumin):  [x]  Within last year (date: 07/01/17)  []  Ordered today  []  Deferred    Hep C screening  [x]  Prior HCV Ab negative (date: 06/21/15)  []  Not indicated (no surgery prior to early 1990s)  []  Ordered    Last hemodynamic cath:   [x]  Within last 10 years (date: 08/08/17), see above results (prior to Fontan revision)  []  Deferred. Reason:   []  Scheduled    Last Liver biopsy  [x]  Within last 10 years (date:08/08/17 ), see above results  []  Scheduled with cath    Liver Imaging:  []  MRI Abdomen within last 5 years (date: ), see above  []  Triple phase CT within last 5 years (date: ), see above  [x]  Liver ultrasound completed (date: 07/01/17), see above  []  Ordered today, see above.     Advanced cardiac imaging:  []  Cardiac MRI (date: ), see above  [x]  Cardiac CT (date: 12/12/2007), see above  [x]  Deferred. Reason: Pacemaker  []  Ordered today, see above     PDE-5 Inhibitor recommended:  [x]  On sildenafil []  On tadalafil  []  Not tolerated. Previously on []  sildenafil (Year: )  []  tadalafil (Year: )   Side effect:  []  Deferred. Reason:   []  Ordered today, see above    Echo  [x]  Within last year (date: 05/07/17), see above results  []  Deferred. Reason:   []  Ordered today    Cardiopulmonary exercise test  [x]  Within last 3 years (date: 06/21/15), see above results  []  Deferred. Reason:   []  Ordered today    Advanced care planning  [x]  Date: Advanced directive in [place] from 12/27/2010  []  Deferred. Reason:

## 2017-08-21 NOTE — Procedures
SEE MEDIA SECTION FOR PROGRAMMER PRINTOUT   [facility] EP Device Clinic Pacemaker     PATIENT: Ann Duran  MRN: [id number]   DOB: 1976-04-01     DATE OF PROCEDURE: 08/21/2017  TIME OF PROCEDURE: 1201     INDICATIONS: Annual pacemaker evaluation  PROCEDURE: Pacemaker evaluation     Location: [facility] [address] MP #330     Technician: Shanon Payor, RN      READING PHYSICIAN:  Dr. Charlesetta Garibaldi     Device:  Device Manufacturer: Medtronic  Device Model Name: Sallee Provencal   Model Number: [id number]  Device Serial Number: [id number]  Device Implant Date: 12/05/2007  Indication for Implant:      RA lead:   Manufacturer: Medtronic  Model Number: [id number]  Serial Number: [id number]  Date of Implant: 12/05/2007     RV lead:  Manufacturer: Medtronic  Model Number: [id number]  Serial Number: [id number]  Date of Implant: 12/05/2007     Battery: <1  24 months, average 11 months     Underlying Rhythm: Bradycardia ~48-52 bpm  R wave: 11.2  15.68 mV     Impedance:  V: 573 ohms     Capture threshold  V: 2.75 V @ 0.52 ms  Since 09/18/2016     Tachy episode summary:  VT/VF: 0     VP: 99.1%     Programmed changes:   None    Settings:   Mode: VVIR  Rate: Lower/Max Track: 70/ 150 bpm  RV Output: 3.5 V @ 0.52 ms  RV Sensitivity: 2.80 mV     Assessment  1. Patient is not pacemaker-dependent.  Ventricular rate ~48-52 bpm.  2. Normal pacemaker function.  3. No ventricular high rates noted.  4. Battery is approaching ERI, approximately <1  24 months, average 11 months     Plan:  1. Patient seen today by Dr. Charlesetta Garibaldi, see separate EP note.  2. No changes made today.  3. Continue monthly remote monitoring for battery observation.  Return in 1 year for follow up.    Shanon Payor, RN

## 2017-08-23 NOTE — Progress Notes
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    Subjective:   History was provided by the patient .  Ann Duran is a 41 y.o. female who was seen in clinic for pacemaker evaluation. He cardiac history is remarkable foir a diagnosis of L-transposition/ single ventricle. Her first cardiac surgery was a Modified RA to pA Fonatn procedure at age 24. She required a thoracic duct ligation after her Fontan and then reportedly did well until age 56 when she presented with a complaint of fatigue and was diagnosed with atrial arrhythmias. She then underwent a Fontan conversion in Jan 2009 at which time pacemaker leads were placed but the device was placed 2 weeks later after she developed bradycardia and fluid retention. The atrial lead was non functional and so she was VVI paced. She underwent attempted atrial lead revision with diaphragm plication in April of 2010, but the new atrial lead also failed to function. She was tried on VVI backup pacing but was eventually changed to VVIR at 70 bpm lower rate, which she has tolerated well since 2011.    Interval History: [name] states she has done well over the last year with stable effort tolerance and no sustained palpitations, syncope or near syncope. She is on no antiarrhythmic medications.    The following items in the patient's chart were reviewed: allergies, current medications, past family history, past medical history, past social history, past surgical history and problem list    Review of Systems:  Pertinent items are noted in HPI    Objective:     Vitals:  BP 118/74  ~ Pulse 87  ~ Temp 36.9 ???C (98.4 ???F) (Oral)  ~ Resp 17  ~ Ht 1.651 m (5' 5'')  ~ Wt 58.1 kg (128 lb 1.4 oz)  ~ LMP  (LMP Unknown) ~ SpO2 94% Comment: HR 87 ~ BMI 21.31 kg/m???  Facility age limit for growth percentiles is 20 years. Facility age limit for growth percentiles is 20 years. Facility age limit for growth percentiles is 20 years.    General:   alert and no distress   Skin:   warm and dry, no hyperpigmentation, vitiligo, or suspicious lesions   Nodes:   cervical, supraclavicular, and axillary nodes normal.   Eyes:   No conjunctival injection, sclerae anicteric   Oropharynx:  lips, mucosa, and tongue normal; teeth and gums normal   Neck:  no adenopathy, no carotid bruit, no JVD, supple, symmetrical, trachea midline and thyroid not enlarged, symmetric, no tenderness/mass/nodules   Lungs:   clear to auscultation bilaterally   Heart:   regular rate and rhythm, S1: single and S2: normal   Abdomen:   soft, non-tender; bowel sounds normal; no masses,  no organomegaly   Extremities:  extremities normal, atraumatic, no cyanosis or edema   Psychiatric:   normal mood, behavior, speech, dress, and thought processes     Pacemaker check (reported separately) Normal VVIR pacemaker function, estimated time to ERI is 11 mo with a range of less than 1 to 24 months. Underlying rhythm is junctional at 48-52 bpm.    Assessment:        Encounter Diagnosis   Name Primary?   ??? Presence of permanent cardiac pacemaker  Stable device function. No significant symptoms with VVIR pacing. When device reaches ERI will need to consider adding an atrial lead but given lack of success at last attempt, patient is reluctant.       Plan/ Recommendation:        Follow up: Remote monitoring on a monthly basis, clinic follow up in one year or when device reaches ERI battery level.       Attending: Vania Rea. Jacorie Ernsberger 08/23/2017 1:00 PM

## 2017-08-23 NOTE — Addendum Note
Addended by: Devota Pace on: 08/23/2017 01:19 PM     Modules accepted: Level of Service

## 2017-08-28 ENCOUNTER — Telehealth: Payer: PRIVATE HEALTH INSURANCE

## 2017-08-29 NOTE — Telephone Encounter
I was called by the patient from [place]. She said that she woke up with a ''insect bite'' on her ankle and it has since gotten more painful and swollen. She denies any trauma to the leg, and does not remember any specific animal or insect biting her. She says the lesion is not itchy but it is painful. She was wondering what to do. I do not have a picture of the lesion, but given its description, it could be a cellulitis. She had a recent procedure in 08/2017, but this seems unrelated to the cath she had. Given that she is in [place], I told her that she should seek medical care and get the leg lesion examined and treated appropriately. She acknowledged this and agreed to seek medical care as soon as possible.    To be discussed with the [facility] attending, Dr. Juel Burrow.    Author:  Monika Salk, MD, MPH  08/28/2017  10:26 PM

## 2017-09-09 ENCOUNTER — Ambulatory Visit: Payer: PRIVATE HEALTH INSURANCE

## 2017-09-09 MED ORDER — FUROSEMIDE 80 MG PO TABS
80 mg | ORAL_TABLET | Freq: Every day | ORAL | 3 refills | Status: AC
Start: 2017-09-09 — End: 2018-07-09

## 2017-09-09 MED ORDER — SPIRONOLACTONE 50 MG PO TABS
100 mg | ORAL_TABLET | Freq: Every day | ORAL | 3 refills | Status: AC
Start: 2017-09-09 — End: 2018-07-09

## 2017-09-13 ENCOUNTER — Ambulatory Visit: Payer: BLUE CROSS/BLUE SHIELD

## 2017-09-18 ENCOUNTER — Telehealth: Payer: PRIVATE HEALTH INSURANCE

## 2017-09-25 ENCOUNTER — Inpatient Hospital Stay: Payer: BLUE CROSS/BLUE SHIELD | Attending: Pediatric Cardiology

## 2017-10-01 ENCOUNTER — Ambulatory Visit: Payer: BLUE CROSS/BLUE SHIELD

## 2017-10-01 ENCOUNTER — Ambulatory Visit: Payer: BLUE CROSS/BLUE SHIELD | Attending: Pediatric Cardiology

## 2017-10-22 ENCOUNTER — Ambulatory Visit: Payer: PRIVATE HEALTH INSURANCE

## 2017-10-22 ENCOUNTER — Ambulatory Visit: Payer: BLUE CROSS/BLUE SHIELD

## 2017-10-22 DIAGNOSIS — I495 Sick sinus syndrome: Secondary | ICD-10-CM

## 2017-10-23 ENCOUNTER — Inpatient Hospital Stay: Payer: BLUE CROSS/BLUE SHIELD | Attending: Pediatric Cardiology

## 2017-10-23 ENCOUNTER — Ambulatory Visit: Payer: BLUE CROSS/BLUE SHIELD | Attending: Pediatric Cardiology

## 2017-10-23 DIAGNOSIS — I495 Sick sinus syndrome: Secondary | ICD-10-CM

## 2017-10-24 NOTE — Procedures
SEE MEDIA SECTION FOR PROGRAMMER PRINTOUT   [facility] EP Device Clinic Pacemaker     PATIENT: Ann InfanteJennifer Lumpkin  MRN: [id number]   DOB: [birth year]     DATE OF PROCEDURE: 10/23/2017  TIME OF PROCEDURE: 1147     INDICATIONS: 2 month pacemaker evaluation  PROCEDURE: Pacemaker evaluation     Location: [facility] [address] MP #330      READING PHYSICIAN:  Dr. Charlesetta GaribaldiKevin Mayana Irigoyen     Device:  Device Manufacturer: Medtronic  Device Model Name: Sallee Provencaldapta L   Model Number: ADDRL1  Device Serial Number: [id number]  Device Implant Date: 12/05/2007  Indication for Implant:      RA lead:   Manufacturer: Medtronic  Model Number: [id number]  Serial Number: [id number]  Date of Implant: 12/05/2007     RV lead:  Manufacturer: Medtronic  Model Number: [id number]  Serial Number: [id number]  Date of Implant: 12/05/2007     Battery: <1  14 months, average 4 months     Underlying Rhythm: Bradycardia ~50s bpm  R wave: 15.68 mV     Impedance:  V: 610 ohms     Capture threshold  V: 2.25 V @ 0.52 ms  Strength duration: 2.0 V @ 0.52 ms/4.0 V @ 0.21    Since 08/21/2017     Tachy episode summary:  VT/VF: 0     VP: 97.9%     Programmed changes:   None     Settings:   Mode: VVIR  Rate: Lower/Max Track: 70/ 150 bpm  RV Output: 3.5 V @ 0.52 ms  RV Sensitivity: 2.80 mV     Assessment  1. Patient is not pacemaker-dependent.  Ventricular rate ~50s bpm.  2. Normal pacemaker function.  3. No ventricular high rates noted.  4. Battery is approaching ERI, approximately <1  14 months, average 4 months     Plan:  1. Patient seen today by Dr. Charlesetta GaribaldiKevin Breyanna Valera, see separate EP note.  2. No changes made today.  3. Continue monthly remote monitoring for battery observation.  Will plan for generator replacement and possible atrial lead placement.  Return in 1 year for follow up, sooner if ERI is reached.     Shanon PayorErin Mendoza, RN

## 2017-10-24 NOTE — Progress Notes
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    Subjective:   Ann Duran is a 41 y.o. female who was seen in clinic for pacemaker evaluation. He cardiac history is remarkable foir a diagnosis of L-transposition/ single ventricle. Her first cardiac surgery was a Modified RA to pA Fonatn procedure at age 79. She required a thoracic duct ligation after her Fontan and then reportedly did well until age 8 when she presented with a complaint of fatigue and was diagnosed with atrial arrhythmias. She then underwent a Fontan conversion in Jan 2009 at which time pacemaker leads were placed but the device was placed 2 weeks later after she developed bradycardia and fluid retention. The atrial lead was non functional and so she was VVI paced. She underwent attempted atrial lead revision with diaphragm plication in April of 2010, but the new atrial lead also failed to function. She was tried on VVI backup pacing but was eventually changed to VVIR at 70 bpm lower rate, which she has tolerated well since 2011, but has had increasing requirement for ventricular pacing.    Interval Events:  Alsha has been doing well.  She recently returned from a work trip in [place].  She has not noticed any changes in her exercise capacity.  She denies any recent palpitations, dizziness, syncope, chest pain or shortness of breath.  She was recently started on Eliquis by the Adult Congenital Team.  She is in clinic today for discussion of generator replacement and possible solutions for atrial pacing.    Past Medical History:  Past Medical History:   Diagnosis Date   ??? Anxiety     controlled per pt   ??? Cholelithiases Noted on ultrasound, asymptomatic    ??? Delayed emergence from general anesthesia 2010    pacemaker insertion, per patient   ??? DORV (double outlet right ventricle)    ??? GERD (gastroesophageal reflux disease)     well controlled w/med and diet   ??? History of blood transfusion    ??? HPV (human papilloma virus) infection    ??? IUD (intrauterine device) in place     Mirena placed 12/27/10, replaced 12/2015, replaced November 16, 2016   ??? 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  Negative tobacco use.  Rare EtOH.    Review of Systems:  14 point review of system negative other than as stated above    Medications:  Outpatient Medications Prior to Visit   Medication Sig Dispense Refill   ??? alprazolam 0.5 mg tablet Take 0.25 mg by mouth at bedtime as needed.     ??? apixaban (ELIQUIS) 5 mg tablet Take 1 tablet (5 mg total) by mouth two (2) times daily. 28 tablet 0   ??? Ascorbic Acid (VITAMIN C) 1000 MG tablet Take 1,000 mg by mouth as needed for .     ??? aspirin (ASPIRIN) 81 mg EC tablet Take 81 mg by mouth daily     ??? cyanocobalamin 1000 mcg tablet Take 1,000 mcg by mouth as needed for .     ??? fluticasone 50 mcg/act nasal spray instill 1 spray into each  nostril once daily  0   ??? furosemide 80 mg tablet Take 1 tablet (80 mg total) by mouth daily. 90 tablet 3   ??? ibuprofen (ADVIL) 200 mg tablet Take 400 mg by mouth every six (6) hours as needed.     ??? Omega-3 Fatty Acids (FISH OIL) 500 mg CAPS capsule Take 500 mg by mouth as needed for.     ??? pantoprazole 20 mg DR tablet Take 1 tablet (20 mg total) by mouth daily as needed. (Patient taking differently: Take 20 mg by mouth as needed for .) 90 tablet 3   ??? potassium chloride 10 MEQ tablet Take 2 tablets (20 mEq total) by mouth two (2) times daily. (Patient taking differently: Take 20 mEq by mouth daily .) 360 tablet 3   ??? sildenafil 20 mg tablet Take 1 tablet (20 mg total) by mouth three (3) times daily. 270 tablet 3 ??? spironolactone 50 mg tablet Take 2 tablets (100 mg total) by mouth daily. 180 tablet 3   ??? valacyclovir 1000 mg tablet ValACYclovir HCl - 1 GM Oral Tablet     ??? valacyclovir 500 mg tablet ValACYclovir HCl - 500 MG Oral Tablet     ??? zolpidem (AMBIEN) 5 mg tablet Take 2.5 mg by mouth at bedtime as needed for Sleep.       No facility-administered medications prior to visit.      Allergies:  No Known Allergies      Objective:     Vitals:  BP 122/68  ~ Pulse 83  ~ Temp 37.2 ???C (99 ???F) (Oral)  ~ Ht 1.651 m (5' 5'')  ~ Wt 57.6 kg (127 lb)  ~ SpO2 92%  ~ BMI 21.13 kg/m???  Facility age limit for growth percentiles is 20 years. Facility age limit for growth percentiles is 20 years. Facility age limit for growth percentiles is 20 years.    General:   alert and no distress   Skin:   warm and dry, no hyperpigmentation, vitiligo, or suspicious lesions   Nodes:   cervical, supraclavicular, and axillary nodes normal.   Eyes:   No conjunctival injection, sclerae anicteric   Oropharynx:  lips, mucosa, and tongue normal; teeth and gums normal   Neck:  no adenopathy, no carotid bruit, no JVD, supple, symmetrical, trachea midline and thyroid not enlarged, symmetric, no tenderness/mass/nodules   Lungs:   clear to auscultation bilaterally   Heart:   regular rate and rhythm, S1: single and S2: normal   Abdomen:   soft, non-tender; bowel sounds normal; no masses,  no organomegaly   Extremities:  extremities normal, atraumatic, no cyanosis or edema   Psychiatric:   normal mood, behavior, speech, dress, and thought processes     Pacemaker Evaluation 10/23/17:  Underlying Rhythm: Bradycardia ~50???s bpm  R wave: 15.68 mV  Impedance: V: 610 ohms  Capture threshold: V: 2.25 V @ 0.52 ms  Strength duration: 2.0 V @ 0.52 ms/4.0 V @ 0.21  Tachy episode summary: VT/VF: 0  ???VP: 97.9%  ???  Programmed changes: None  ???  Settings:   Mode: VVIR  Rate: Lower/Max Track: 70/ 150 bpm  RV Output: 3.5 V @ 0.52 ms  RV Sensitivity: 2.80 mV    Assessment: 41 y.o. female with DORV status post Fontan conversion to extracardiac baffle on 11/21/2007, with subsequent placement of an epicardial pacemaker for sinus node dysfunction.  She had atrial lead failure shortly after initial placement.  In 2010 she underwent an unsuccessful attempt at placement  of a new atrial lead.    She has done well until now with minimal ventricular pacing however over the course of the past 2 years her ventricular pacing has increased and is now consistently over 90%.  Her battery is nearing ERI and we discussed that she is due for a generator replacement at a minimum.  We discussed that patients with single ventricle anatomy have poor outcomes with chronic ventricular pacing and it would be our recommendation to attempt placement of a new atrial lead.  This can be accomplished in 2 ways.  One method would be by the mapping her atria to define an optimal site for pacing.  A surgeon could then place a new atrial lead at this site and replace her generator.  An alternative approach that has been described in the literature and performed by our group would be to place an entirely new system.  A single atrial lead could be placed through her SVC and LPA and punctured across to the epicardial surface of the atria below the pulmonary artery.  The advantage of this system would be less invasive with quicker recovery postoperatively.  This would require however continuation of chronic anticoagulation which she is already on.    We discussed the risks and benefits of both of these approaches with Hiilei and both her parents.  They had appropriate questions all of which were answered.  We will plan to schedule her in the upcoming year for electroanatomic mapping of her atria.  She will decide if she wants [place] to attempt a new transvenous system or prefers to have a surgically placed atrial lead with a new generator.     Plan/ Recommendation:   - continue monthly remote checks - CTA chest pre-op / pre-cath  - Schedule for electroanatomic mapping, possible new transvenous atrial pacing system system  - no medication changes    Patient was seen and discussed with Dr. Charlesetta Garibaldi    Rickey Barbara MD  Pediatric EP Fellow

## 2017-10-26 DIAGNOSIS — Q201 Double outlet right ventricle: Secondary | ICD-10-CM

## 2017-10-26 DIAGNOSIS — Z950 Presence of cardiac pacemaker: Secondary | ICD-10-CM

## 2017-10-26 DIAGNOSIS — Z9889 Other specified postprocedural states: Secondary | ICD-10-CM

## 2017-11-03 DIAGNOSIS — I495 Sick sinus syndrome: Secondary | ICD-10-CM

## 2017-11-03 DIAGNOSIS — Z9889 Other specified postprocedural states: Secondary | ICD-10-CM

## 2017-11-03 DIAGNOSIS — Z950 Presence of cardiac pacemaker: Secondary | ICD-10-CM

## 2017-11-14 ENCOUNTER — Telehealth: Payer: BLUE CROSS/BLUE SHIELD

## 2017-11-14 NOTE — Telephone Encounter
RN spoke with Victorino Dike who has questions for MD [facility]. RN will email shannon regarding this. She would also like for Dr. Carollee Herter to reach out to Dr. Hanley Ben regarding the pacemaker options.

## 2017-11-19 ENCOUNTER — Ambulatory Visit: Payer: BLUE CROSS/BLUE SHIELD

## 2017-11-20 ENCOUNTER — Telehealth: Payer: BLUE CROSS/BLUE SHIELD

## 2017-11-20 NOTE — Telephone Encounter
Spoke with Ann Duran who stated she went for a second opinion for her device replacement and the device was found to be at [facility] as of January 9.  She would like to proceed with the pacemaker replacement only.  Informed patient this information will be forwarded to Dr. Carollee Herter so replacement can be arranged.

## 2017-11-28 ENCOUNTER — Ambulatory Visit: Payer: BLUE CROSS/BLUE SHIELD

## 2017-11-28 DIAGNOSIS — Z01818 Encounter for other preprocedural examination: Secondary | ICD-10-CM

## 2017-12-09 ENCOUNTER — Ambulatory Visit: Payer: BLUE CROSS/BLUE SHIELD

## 2017-12-10 ENCOUNTER — Inpatient Hospital Stay: Payer: BLUE CROSS/BLUE SHIELD

## 2017-12-10 ENCOUNTER — Ambulatory Visit: Payer: BLUE CROSS/BLUE SHIELD

## 2017-12-10 DIAGNOSIS — Z950 Presence of cardiac pacemaker: Secondary | ICD-10-CM

## 2017-12-10 DIAGNOSIS — Z9889 Other specified postprocedural states: Secondary | ICD-10-CM

## 2017-12-10 DIAGNOSIS — Z01818 Encounter for other preprocedural examination: Secondary | ICD-10-CM

## 2017-12-10 DIAGNOSIS — Q201 Double outlet right ventricle: Secondary | ICD-10-CM

## 2017-12-10 NOTE — Consults
Cardiac Surgery Consultation    NAME:  CHEKESHA BEHLKE  MRN:   [id number]  DATE OF SERVICE: 12/10/2017  SPECIALTY: Cardiothoracic Surgery  CARDIOTHORACIC SURGEON:  Richardo Hanks, MD  REFERRING CARDIOLOGIST:  Charlesetta Garibaldi, MD/Jamil Aboulhosn, MD/Richard Renato Battles, MD    CHIEF COMPLAINT: ???     Chief Complaint   Patient presents with   ??? Consult For     pacemaker generator replacement       HISTORY OF PRESENT ILLNESS: ???     Ms. Stanhope is a 42 y.o. 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    09/2007 - found to be in atrial fibrillation , presented with fatigue and exercise intolerance  10/2007 - underwent cardioversion (Dr. Carollee Herter), and cardiac cath which showed excellent RA/Fontan pressures  11/21/2007 - underwent revision of Fontan, insertion of 20 mm reinforced Gore-Tex graft from IVC to PA, bidirectional Glenn shunt, reconstruction of pulmonary artery, takedown of connection between RA and PA, atrial septectomy removal of previously placed atrial patch, left and right sided modified MAZE, redo sternotomy.  (Dr. Larwance Sachs - [place])  Discharged 11/29/07  12/03/2007 -readmitted for large right pleural effusion in setting of bradycardia and junctional rhythm, s/p thoracentesis.  12/05/2007 - underwent surgical placement of abdominal pacemaker generator, however noncapturing of atrial lead.  Discharged 12/12/07 after aggressive diuresis.    03/02/2009 - underwent left hemidiaphragm plication (Dr. Larwance Sachs), unsuccessful attempt at placement of atrial lead.   PPM rate set at VVI 90 bpm during hospitalization, reduced to 60 bpm by Dr. Carollee Herter 03/29/09.  05/2010 rate response activated with significant improvement in exercise ability.      She did well with minimal ventricular pacing, but over the course of past 2 years, her ventricular pacing has increased, and now consistently over 90%.  Her battery is nearing ERI.  She is being referred for pacemaker generator change.    08/08/17  TEE, right and left heart cath, coil embolization   Current symptoms:   Not currently experiencing symptoms.  No shortness of breath, no chest pain,   Occasional dizziness/lightheadedness  No palpitations.        RISK FACTORS:   Diabetes:  No.  Dialysis:   No  Dyslipidemia:  No  Hypertension:  No  Endocarditis:  No.    PREOPERATIVE CARDIAC STATUS  Prior Myocardial Infarction:  No  Cardiac Presentation/Symptoms:  No symptoms  Anginal Classification Within 2 Weeks: CCS Class 0  Heart Failure Within 2 Weeks:  No.  Prior Heart Failure:  yes  Cardiogenic Shock:  No document symptoms.  Resuscitation:  No.  Arrythmias:  {CTS Arrhythmia History:20656}    PAST MEDICAL HISTORY: ???     Past Medical History:   Diagnosis Date   ??? Anxiety     controlled per pt   ??? Cholelithiases     Noted on ultrasound, asymptomatic    ??? Delayed emergence from general anesthesia 2010    pacemaker insertion, per patient   ??? DORV (double outlet right ventricle)    ??? GERD (gastroesophageal reflux disease)     well controlled w/med and diet   ??? History of blood transfusion    ??? HPV (human papilloma virus) infection    ???  IUD (intrauterine device) in place     Mirena placed 12/27/10, replaced 12/2015, replaced November 16, 2016   ??? Pacemaker     in abd     Pulmonary disease:  No.  Cerebrovascular disease:  No.  CVD TIA: No  CVD Carotid Stenosis:  None   Renal disease:  none  Liver disease:  none  Hx of GERD:  Mild, controlled with meds,   Hx of mediastinal radiation:  No  Sleep Apnea:  No  Thoracic Aorta Disease:  no  Cancer within 5 years:  no  Immunocompromise Present:  no  Depression:  no Unresponsive State:  no  Peripheral Artery Disease:  no  Home Oxygen use:  No       PAST SURGICAL HISTORY:      Past Surgical History:   Procedure Laterality Date   ??? CERVICAL BIOPSY  W/ LOOP ELECTRODE EXCISION  11/16/2016    Also, Mirena IUD replacement as well as excision of vulvar skin   ??? Diaphragmatic plication  2010   ??? Exam under anesthesia; hysteroscopy; dilation and curettage; placement of a Mirena intrauterine device  12/27/10   ??? FONTAN PROCEDURE  1982    Modified   ??? INSERT / REPLACE / REMOVE PACEMAKER  2009    Revise Fontan, Maze procedure       ALLERGIES: ???     No Known Allergies    MEDICATIONS: ???     Medications that the patient states to be currently taking   Medication Sig   ??? alprazolam 0.5 mg tablet Take 0.25 mg by mouth at bedtime as needed.   ??? Ascorbic Acid (VITAMIN C) 1000 MG tablet Take 1,000 mg by mouth as needed for .   ??? aspirin (ASPIRIN) 81 mg EC tablet Take 81 mg by mouth daily   ??? cyanocobalamin 1000 mcg tablet Take 1,000 mcg by mouth as needed for .   ??? fluticasone 50 mcg/act nasal spray instill 1 spray into each nostril once daily   ??? furosemide 80 mg tablet Take 1 tablet (80 mg total) by mouth daily.   ??? pantoprazole 20 mg DR tablet Take 1 tablet (20 mg total) by mouth daily as needed. (Patient taking differently: Take 20 mg by mouth as needed for .)   ??? potassium chloride 10 MEQ tablet Take 2 tablets (20 mEq total) by mouth two (2) times daily. (Patient taking differently: Take 20 mEq by mouth daily .)   ??? sildenafil 20 mg tablet Take 1 tablet (20 mg total) by mouth three (3) times daily.   ??? spironolactone 50 mg tablet Take 2 tablets (100 mg total) by mouth daily.   ??? valacyclovir 1000 mg tablet ValACYclovir HCl - 1 GM Oral Tablet   ??? zolpidem (AMBIEN) 5 mg tablet Take 2.5 mg by mouth at bedtime as needed for Sleep.       Betablocker Therapy (for isolated CABG): {Responses; yes/no:74}.  If contraindicated, why:   [] On betablocker therapy within 24 hours of surgery. [] Betablocker therapy within 24 hours of surgery is contraindicated for this patient.     SOCIAL HISTORY: ???     Ms. Kilbourne is single and lives alone.  Parents live nearby and involved.   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 Smoking status: Never Smoker   ??? Smokeless tobacco: Never Used   ??? Alcohol use 1.8 oz/week     3 Standard drinks  or equivalent per week      Comment: holding off since 12/07/17        Substance use         No    FAMILY HISTORY:      Family history reviewed.  Premature CAD:  {Responses; yes/no/unknown:74}  1st generation family member (parents/sibling/children) with CAD diagnosed and/or treated prior to age 47 for female relatives or less than 65 years for female relatives:  {Responses; yes**/no:17258}  Mother:  Healthy,  Mitral valve prolapse  Father:   Healthy,  S/p MVR, MAZE for afib  Siblings:  1 older brother, no heart issues    REVIEW OF SYSTEMS: ???     A full 14 point review of systems was conducted.  The pertinent positives were listed in the HPI.    General: Denies Fever, Chills, Night sweats, Right handed, Wt gain 3-4 lb recently   Neuro: Denies: numbness, seizure, stroke, TIA  HEENT:  Denies headache, vision problems, glasses/contacts, hearing problems, Epistaxis, Dysphagia, Hoarseness, Sore throat, Dentures  Cardiac: fatigue Denies: chest pain (at rest or on exertion), shortness of breath, dyspnea on exertion, fatigue, orthopnea, PND, lower extremity edema, palpitation, syncope  Pulmonary:  Denies: cough, hemoptysis, asthma, pneumonia, tuberculosis.    GI: mild GERD  Denies: constipation, diarrhea, jaundice, melena, nausea, vomiting, PUD, GERD.    Genitourinary: Denies: discharge, dysuria, frequency, hematuria, nocturia, urgency.    Musculoskeletal: left neck and shoulder blade discomfort (muscle) Denies: joint pain, tenderness, back pain, muscular weakness.    Skin: Denies: infection, rash, echymosis. Endocrine: Denies:  diabetes, thyroid problems.    Hematology: Denies: bleeding dyscrasias.    Vascular: Denies: claudication, varicose veins.    Immunologic: Denies: immune compromised.    Psychiatric: Denies: anxiety, depression.      PHYSICAL EXAM: ???     Wt Readings from Last 3 Encounters:   12/10/17 129 lb 6.4 oz (58.7 kg)   10/23/17 127 lb (57.6 kg)   08/21/17 128 lb 1.6 oz (58.1 kg)       Vital Signs:  BP 119/61  ~ Pulse 65  ~ Temp 36.7 ???C (98.1 ???F) (Oral)  ~ Resp 15  ~ Ht 5' 5'' (1.651 m)  ~ Wt 129 lb 6.4 oz (58.7 kg)  ~ LMP 12/09/2017 (Exact Date)  ~ SpO2 90% Comment: ra ~ Breastfeeding? No Comment: Spotting  ~ BMI 21.53 kg/m???    General appearance: well developed, well nourished female in no acute distress  Neuro: Grossly intact, Alert and oriented x 4. Moves all extremities grossly equal.   HEENT: Normocephelic, pupils equal round reactive to light, sclera anicteric, extraocular movements intact, oropharynx clear,   Neck:  no lymphadenopathy  Lungs: Respiratory effort is normal. Breath sounds clear to auscultation bilaterally. No wheezes, rhonchi or rales.  Cardiac: Regular rate and rhythm, S1/S2,   Abdomen: Soft, nontender, nondistended.    Extremities: Warm and well perfused.  No edema, no clubbing or cyanosis.    Musculoskeletal: Grossly intact.  No muscular atrophy.  Skin: well healed mid sternotomy incision, No rashes or lesions noted.      LABORATORY DATA:      CBC:    Lab Results   Component Value Date    WBC 5.11 12/10/2017    HGB 14.5 12/10/2017    HCT 44.9 12/10/2017    PLT 114 (L) 12/10/2017     ELECTROLYTES:    Lab Results   Component Value Date    NA 135 09/13/2017    K 4.5  09/13/2017    CL 101 09/13/2017    CO2 TNP 09/13/2017    BUN 21 09/13/2017    CREAT 1.06 09/13/2017    GLUCOSE 91 09/13/2017     HGBA1C:    Lab Results   Component Value Date    HGBA1C 6.0 (@) 02/23/2009     LIVER FUNCTION:    Lab Results   Component Value Date    AST 24 06/21/2015    ALT 20 06/21/2015 ALKPHOS 72 06/21/2015    ALBUMIN 5.2 (H) 06/21/2015     CHOLESTEROL:  No results found for: CHOL, CHOLDLCAL  COAGS:    Lab Results   Component Value Date    PT 14.7 (H) 12/10/2017    INR 1.2 12/10/2017       DIAGNOSTIC STUDIES:      TRANSESOPHAGEAL ECHO:  08/08/17  PHYSICIAN INTERPRETATION:  LEFT VENTRICLE: Normal left ventricular size. Mildly reduced systolic function. Visually estimated left ventricular ejection fraction45-50%. Diastolic parameters not obtained. The dominant ventricle is posterior and rightward, appears to be a morphologic   left ventricle. Nonobstructive bulboventricular foramen.  RIGHT VENTRICLE: Hypoplastic ventricle is anterior and leftward.  RIGHT ATRIUM: Right atrial pressure is estimated at 8 mmHg. Large secundum ASD.  MITRAL VALVE: Right atrioventricular valve has normal leaflet mobility without stenosis; trace regurgitation.  TRICUSPID VALVE: Left atrioventricular AV valve has normal mobility, trace regurgitation.  AORTIC VALVE: No significant aortic stenosis. Aorta is anterior to the pulmonary artery, arises from the hypoplastic anterior/rightward ventricle. No stenosis or regurgitation. No evidence of aortic regurgitation.  PULMONIC VALVE: Pulmonary valve arises from the hypoplastic ventricle, is posterior to the aortic valve, and hypoplastic and appears to be oversewn. There is no evidence of antegrade flow.  AORTA: The aortic root is normal in size and structure, normal aortic arch, normal descending aorta and normal mid ascending aorta.  PULMONARY VEINS: Diastolic-dominant flow is demonstrated in all four pulmonary veins.  SYSTEMIC VEINS: The inferior vena cava is not well visualized.  PERICARDIUM: There is no evidence of pericardial effusion.     CONCLUSIONS:   1. {S,L,A} Double-outlet right ventricle, L-TGA and aorta anterior to pulmonary artery. Status post extracardiac Fontan. Fontan is patent; no evidence of thrombus. 2. The dominant ventricle is posterior and rightward, appears to be a morphologic left ventricle. Nonobstructive bulboventricular foramen. Normal size, mildly reduced systolic function.   3. Left ventricular ejection fraction is approximately 45-50%.   4. Right atrioventricular valve has normal leaflet mobility without stenosis; trace regurgitation.   5. Left atrioventricular AV valve has normal mobility, trace regurgitation.   6. Aorta is anterior to the pulmonary artery, arises from the hypoplastic anterior/rightward ventricle. No stenosis or regurgitation.   7. Pulmonary valve arises from the hypoplastic ventricle, is posterior to the aortic valve, and hypoplastic and appears to be oversewn. There is no evidence of antegrade flow.   8. Selective agitated saline injections in the right and left pulmonary arteries revealed trace transpulmonary shunting.    RHC/LHC, COIL EMBOLIZATION:  08/08/17  Findings:   -Mildly elevated Fontan pressures ( ). No obstruction within the Fontan circuit.  -No evidence of portal hypetension (transhepatic wedge 1 mmHg)  -Normal LVEDP (9 mmHg)  -no gradient across the aortic valve  -minimal shunting with agitated saline study in LPA and RPA.   -large veno-venous collateral from innominate vein to coronary sinus.   -s/p liver biopsy    PACEMAKER EVALUATION:  10/23/17  Assessment  1. Patient is not pacemaker-dependent. ???Ventricular rate ~50???s bpm.  2. Normal pacemaker function.  3. No ventricular high rates noted.  4. Battery is approaching ERI, approximately <1 ??? 14 months, average 4 months       IMPRESSION:      Ms. Bellino is a 42 y.o. female with DORV, L-TGA, univentricular, s/p Modified Fontan, s/p thoracic duct ligation  s/p revision to Extracardiac Fontan, MAZE, epicardial lead placement (11/2007)  Junctional bradycardia s/p abdominal pacemaker generator (2009), atrial lead non-functional  S/p left hemidiaphragm plication, failed atrial lead placement (02/2009) Pacemaker at [facility]      DISCUSSION/PLAN:

## 2017-12-12 ENCOUNTER — Ambulatory Visit: Payer: BLUE CROSS/BLUE SHIELD

## 2017-12-12 ENCOUNTER — Inpatient Hospital Stay
Admit: 2017-12-12 | Discharge: 2017-12-12 | Disposition: A | Discharge status: Home / Self Care | Payer: BLUE CROSS/BLUE SHIELD | Source: Home / Self Care

## 2017-12-12 DIAGNOSIS — Z45010 Encounter for checking and testing of cardiac pacemaker pulse generator [battery]: Secondary | ICD-10-CM

## 2017-12-12 LAB — Pregnancy Test,Urine: PREGNANCY TEST,URINE: NEGATIVE

## 2017-12-12 MED ORDER — OXYCODONE HCL 5 MG PO TABS
ORAL_TABLET | 0 refills | 5.00 days
Start: 2017-12-12 — End: 2018-05-14
  Filled 2017-12-13: qty 10, 5d supply, fill #0

## 2017-12-12 MED ORDER — CEPHALEXIN 500 MG PO CAPS
500 mg | ORAL_CAPSULE | Freq: Four times a day (QID) | ORAL | 0 refills | Status: AC
Start: 2017-12-12 — End: ?

## 2017-12-12 MED ADMIN — EPHEDRINE SULFATE 50 MG/ML IJ SOLN: @ 16:00:00 | Stop: 2017-12-12 | NDC 17478041510

## 2017-12-12 MED ADMIN — ROCURONIUM BROMIDE 50 MG/5ML IV SOLN: INTRAVENOUS | @ 16:00:00 | Stop: 2017-12-12 | NDC 39822420002

## 2017-12-12 MED ADMIN — HYDROMORPHONE HCL 1 MG/ML IJ SOLN: INTRAVENOUS | @ 17:00:00 | Stop: 2017-12-12 | NDC 00409255201

## 2017-12-12 MED ADMIN — PHENYLEPHRINE HCL 10 MG/ML IJ SOLN: INTRAVENOUS | @ 16:00:00 | Stop: 2017-12-12 | NDC 00641614225

## 2017-12-12 MED ADMIN — FENTANYL CITRATE (PF) 100 MCG/2ML IJ SOLN: INTRAVENOUS | @ 16:00:00 | Stop: 2017-12-12 | NDC 00409909422

## 2017-12-12 MED ADMIN — LIDOCAINE HCL (CARDIAC) 20 MG/ML IV SOLN: INTRAVENOUS | @ 16:00:00 | Stop: 2017-12-12 | NDC 00409132305

## 2017-12-12 MED ADMIN — ONDANSETRON HCL 4 MG/2ML IJ SOLN: INTRAVENOUS | @ 17:00:00 | Stop: 2017-12-12 | NDC 00641607825

## 2017-12-12 MED ADMIN — PROCHLORPERAZINE EDISYLATE 5 MG/ML IJ SOLN: 5 mg | INTRAVENOUS | @ 19:00:00 | Stop: 2017-12-13 | NDC 70860077802

## 2017-12-12 MED ADMIN — CEFAZOLIN SODIUM 1 G IJ SOLR: @ 16:00:00 | Stop: 2017-12-12 | NDC 00143992490

## 2017-12-12 MED ADMIN — DOUBLE ANTIBIOTIC IRRIGATION: 500 mL | @ 17:00:00 | Stop: 2017-12-13 | NDC 70860010310

## 2017-12-12 MED ADMIN — MIDAZOLAM HCL 10 MG/10ML IJ SOLN: INTRAVENOUS | @ 17:00:00 | Stop: 2017-12-12 | NDC 00409258705

## 2017-12-12 MED ADMIN — PROPOFOL 200 MG/20ML IV EMUL: @ 16:00:00 | Stop: 2017-12-12 | NDC 63323026929

## 2017-12-12 MED ADMIN — PLASMA-LYTE A IV SOLN: INTRAVENOUS | @ 18:00:00 | Stop: 2017-12-12 | NDC 00338022104

## 2017-12-12 MED ADMIN — HYDROMORPHONE HCL 1 MG/ML IJ SOLN: .3 mg | INTRAVENOUS | @ 18:00:00 | Stop: 2017-12-13

## 2017-12-12 MED ADMIN — BUPIVACAINE HCL (PF) 0.25 % IJ SOLN: @ 17:00:00 | Stop: 2017-12-13 | NDC 55150016830

## 2017-12-12 MED ADMIN — KETOROLAC TROMETHAMINE 30 MG/ML IJ SOLN: @ 17:00:00 | Stop: 2017-12-12 | NDC 47781058468

## 2017-12-12 MED ADMIN — EPHEDRINE SULFATE 50 MG/ML IJ SOLN: @ 17:00:00 | Stop: 2017-12-12 | NDC 17478041510

## 2017-12-12 MED ADMIN — VANCOMYCIN 1 GM/200 ML RTU: @ 17:00:00 | Stop: 2017-12-12 | NDC 00338355248

## 2017-12-12 MED ADMIN — DEXAMETHASONE SODIUM PHOSPHATE 4 MG/ML IJ SOLN: INTRAVENOUS | @ 16:00:00 | Stop: 2017-12-12 | NDC 67457042312

## 2017-12-12 MED ADMIN — SUGAMMADEX SODIUM 200 MG/2ML IV SOLN: @ 17:00:00 | Stop: 2017-12-12 | NDC 00006542312

## 2017-12-12 MED ADMIN — VANCOMYCIN HCL 1000 MG TOPICAL: @ 17:00:00 | Stop: 2017-12-13

## 2017-12-12 MED ADMIN — SODIUM CHLORIDE 0.9 % IV SOLN: INTRAVENOUS | @ 16:00:00 | Stop: 2017-12-12 | NDC 00338004902

## 2017-12-12 NOTE — Op Note
1610: pt arrived to pacu via gurney from OR. VSS; PIV intact and patent. L radial art line. Midline chest dressing with bandaid and stitch, cdi.   0940: pt scripts faxed to pharm

## 2017-12-12 NOTE — H&P
UPDATED H&P REQUIREMENT    For [facility] and [facility]    WHAT IS THE STATUS OF THE PATIENT'S MOST CURRENT HISTORY AND PHYSICAL?   - The most current H&P is >24 hours and <30 days, and having examined the patient, I attest that there have been no changes. (This suffices as an update to the H&P).      REFER TO MEDICAL STAFF POLICIES REGARDING PRE-PROCEDURE HISTORY AND PHYSICAL EXAMINATION AND UPDATED H&P REQUIREMENTS BELOW:    [facility] and [facility] Medical Staff Policy 200 - For Patients Undergoing Procedures Requiring Moderate or Deep Sedation, General Anesthesia or Regional Anesthesia    Contents of a History and Physical Examination (H&P):    The H&P shall consist of chief complaint, history of present illness, allergies and medications, relevant social and family history, past medical history, review of systems and physical examination, and assessment and plan appropriate to the patient's age.    For Patients Undergoing Procedures Requiring Moderate or Deep Sedation, General Anesthesia or Regional Anesthesia:    1. An H&P shall be performed within 24 hours prior to the procedure by a qualified member of the medical staff or designee with appropriate privileges, except as noted in item 2 below.    2. If a complete history and physical was performed within thirty (30) calendar days prior to the patient's admission to the Medical Center for elective surgery, a member of the medical staff assumes the responsibility for the accuracy of the clinical information and will need to document in the medical record within twenty-four (24) hours of admission and prior to surgery or major invasive procedure, that they either attest that the history and physical has been reviewed and accepted, or document an update of the original history and physical relevant to the patient's current  clinical status.    3. Providing an H&P for patients undergoing surgery under local anesthesia is at the discretion of the Attending Physician.     4. When a procedure is performed by a dentist, podiatrist or other practitioner who is not privileged to perform an H&P, the anesthesiologist's assessment immediately prior to the procedure will constitute the 24 hour re-assessment.The dentist, podiatrist or other practitioner who is not privileged to perform an H&P will document the history and physical relevant to the procedure.    5. If the H&P and the written informed consent for the surgery or procedure are not recorded in the patient's medical record prior to surgery, the operation shall not be performed unless the attending physician states in writing that such a delay could lead to an adverse event or irreversible damage to the patient.    6. The above requirements shall not preclude the rendering of emergency medical or surgical care to a patient in dire circumstances.

## 2017-12-12 NOTE — Op Note
Hand off report from [facility].  Patient received in stable condition, see chartinging.

## 2017-12-12 NOTE — Op Note
Anesthesia team Captain paged as patient continues to have nausea and vomitting.  States he wil  be over in a few minutes.

## 2017-12-12 NOTE — Op Note
Patient is now ready for discharge.  Ambulating independently, steady and safe omn feet.  No longer nauseated, able to tolerate fluids.

## 2017-12-13 ENCOUNTER — Telehealth: Payer: BLUE CROSS/BLUE SHIELD

## 2017-12-16 ENCOUNTER — Ambulatory Visit: Payer: BLUE CROSS/BLUE SHIELD

## 2017-12-16 ENCOUNTER — Telehealth: Payer: BLUE CROSS/BLUE SHIELD

## 2017-12-16 MED ORDER — POTASSIUM CHLORIDE CRYS ER 10 MEQ PO TBCR
20 meq | ORAL_TABLET | Freq: Two times a day (BID) | ORAL | 3 refills | Status: AC
Start: 2017-12-16 — End: 2018-07-09

## 2017-12-16 NOTE — Telephone Encounter
Spoke with patient. She will connect her new carelink box once she gets it to verify that current device is programmed as old device was. She is also more than welcome to be overbook on one of Dr Carollee Herter clinic day but he can not see her when he is in the cath lab.

## 2017-12-17 LAB — Tissue Exam

## 2017-12-19 ENCOUNTER — Ambulatory Visit: Payer: BLUE CROSS/BLUE SHIELD

## 2017-12-19 ENCOUNTER — Ambulatory Visit: Payer: PRIVATE HEALTH INSURANCE

## 2017-12-19 ENCOUNTER — Inpatient Hospital Stay: Payer: BLUE CROSS/BLUE SHIELD | Attending: Pediatric Cardiology

## 2017-12-19 DIAGNOSIS — Z950 Presence of cardiac pacemaker: Secondary | ICD-10-CM

## 2017-12-19 DIAGNOSIS — I495 Sick sinus syndrome: Secondary | ICD-10-CM

## 2017-12-19 NOTE — Progress Notes
PATIENT: Ann Duran  MRN: [id number]  DOB: 14-Oct-1976  DATE OF SERVICE: 12/19/2017  SERVICE: Cardiothoracic Surgery  SURGEON:  Richardo Hanks, MD    REASON FOR VISIT:  Postop Follow-up    HISTORY OF PRESENT ILLNESS:    [name] is a 42 y.o. 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    09/2007 - found to be in atrial fibrillation , presented with fatigue and exercise intolerance  10/2007 - underwent cardioversion (Dr. Carollee Herter), and cardiac cath which showed excellent RA/Fontan pressures  11/21/2007 - underwent revision of Fontan, insertion of 20 mm reinforced Gore-Tex graft from IVC to PA, bidirectional Glenn shunt, reconstruction of pulmonary artery, takedown of connection between RA and PA, atrial septectomy removal of previously placed atrial patch, left and right sided modified MAZE, redo sternotomy.  (Dr. Larwance Sachs - [place])  Discharged 11/29/07  12/03/2007 -readmitted for large right pleural effusion in setting of bradycardia and junctional rhythm, s/p thoracentesis.  12/05/2007 - underwent surgical placement of abdominal pacemaker generator, however noncapturing of atrial lead.  Discharged 12/12/07 after aggressive diuresis.    03/02/2009 - underwent left hemidiaphragm plication (Dr. Larwance Sachs), unsuccessful attempt at placement of atrial lead.   PPM rate set at VVI 90 bpm during hospitalization, reduced to 60 bpm by Dr. Carollee Herter 03/29/09.  05/2010 rate response activated with significant improvement in exercise ability.      She did well with minimal ventricular pacing, but over the course of past 2 years, her ventricular pacing has increased, and now consistently over 90%.  Her battery is nearing ERI.  She is being referred for pacemaker generator change.    08/08/17  TEE, right and left heart cath, coil embolization   OR DATE:  12/12/17   OPERATION:    Pacemaker Generator Replacement  DC DATE:  12/12/17    Ann Duran returns today for post operative follow-up.   Reports no fevers. Drinking and eating regular meals, regular bowel movements (took miralax)  and urination, not sleeping well.  Dull aching pain left neck   Taking regular small walks throughout the day, after 35 steps, heart rate beating fast and hard.  Saw NP Erin - Dr. Bethann Berkshire office, decreased sensitivity of pacer.  Pacing increased to 70.   breathing fine.  No shortness of breath  Pain level 0    Past Medical History:   Diagnosis Date   ??? Anxiety     controlled per pt   ??? Cholelithiases     Noted on ultrasound, asymptomatic    ??? Delayed emergence from general anesthesia 2010    pacemaker insertion, per patient   ??? DORV (double outlet right ventricle)    ??? GERD (gastroesophageal reflux disease)     well controlled w/med and diet   ??? History of blood transfusion    ??? HPV (human papilloma virus) infection    ??? IUD (intrauterine device) in place     Mirena placed 12/27/10, replaced 12/2015, replaced November 16, 2016   ??? Pacemaker     in abd       Past Surgical History:   Procedure Laterality Date   ??? CERVICAL BIOPSY  W/ LOOP ELECTRODE EXCISION  11/16/2016  Also, Mirena IUD replacement as well as excision of vulvar skin   ??? Diaphragmatic plication  2010   ??? Exam under anesthesia; hysteroscopy; dilation and curettage; placement of a Mirena intrauterine device  12/27/10   ??? FONTAN PROCEDURE  1982    Modified   ??? INSERT / REPLACE / REMOVE PACEMAKER  2009    Revise Fontan, Maze procedure       No Known Allergies    Medications that the patient states to be currently taking   Medication Sig ??? alprazolam 0.5 mg tablet Take 0.25 mg by mouth at bedtime as needed.   ??? Ascorbic Acid (VITAMIN C) 1000 MG tablet Take 1,000 mg by mouth as needed for .   ??? aspirin (ASPIRIN) 81 mg EC tablet Take 81 mg by mouth daily   ??? cyanocobalamin 1000 mcg tablet Take 1,000 mcg by mouth as needed for .   ??? furosemide 80 mg tablet Take 1 tablet (80 mg total) by mouth daily.   ??? ibuprofen (ADVIL) 200 mg tablet Take 400 mg by mouth every six (6) hours as needed.   ??? pantoprazole 20 mg DR tablet Take 1 tablet (20 mg total) by mouth daily as needed. (Patient taking differently: Take 20 mg by mouth as needed for .)   ??? potassium chloride 10 MEQ tablet Take 2 tablets (20 mEq total) by mouth two (2) times daily.   ??? sildenafil 20 mg tablet Take 1 tablet (20 mg total) by mouth three (3) times daily.   ??? spironolactone 50 mg tablet Take 2 tablets (100 mg total) by mouth daily.   ??? valacyclovir 1000 mg tablet ValACYclovir HCl - 1 GM Oral Tablet   ??? zolpidem (AMBIEN) 5 mg tablet Take 2.5 mg by mouth at bedtime as needed for Sleep.     Physical Examination:  Vitals: BP (!) 107/92  ~ Pulse 88  ~ Temp 36.3 ???C (97.3 ???F) (Oral)  ~ Resp 16  ~ Ht 5' 5'' (1.651 m)  ~ Wt 127 lb 3.2 oz (57.7 kg)  ~ LMP 12/12/2017  ~ SpO2 92% Comment: RA ~ BMI 21.17 kg/m???     General: No apparent distress  Neuro:  Alert, oriented x 4, moving all extremities grossly equal, no focal deficits  HEENT:  NC/AT, sclera anicteric, MMM,   Pulmonary:  Respirations effort normal.  Breath sounds CTAB  Cardiac:  Regular rate and rhythm, S1, S2,   Abdomen:  Soft, nontender, nondistended,  Skin incisions:  Incisions are clean, dry, intact, no erythema, no drainage, no swelling.    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  Vital signs are stable.  Incisions are healing fine.  No signs of infection.      Pacemaker check today with Dr. Bethann Berkshire team     Questions answered, patient education  provided, including incision care/precautions.   Instructed to call with questions/concerns.     Recommendations were discussed with patient/family.

## 2017-12-19 NOTE — Procedures
SEE MEDIA SECTION FOR PROGRAMMER PRINTOUT   [facility] Pacemaker     PATIENT: Ann Duran  MRN: [id number]  DOB: 11/14/1975     DATE OF PROCEDURE: 12/19/2017  TIME OF PROCEDURE: 1044     INDICATIONS: Pacemaker evaluation for arrhythmias  PROCEDURE: Pacemaker evaluation     Location: [facility]      Technician: Shanon Payor, RN    READING PHYSICIAN:  Dr. Charlesetta Garibaldi     Device:  Device Manufacturer: Medtronic  Device Model Name: Jamse Mead DR MRI  Model Number: [id number]  Device Serial Number: [id number]  Device Implant Date: 12/12/2017  Indication for Implant:      RA lead:   Manufacturer: Medtronic   Model Number: [id number]  Serial Number: [id number]  Date of Implant: 12/05/2007     RV lead:  Manufacturer: Medtronic   Model Number: [id number]  Serial Number: [id number]  Date of Implant: 12/05/2007     Battery: 7.7     Underlying Rhythm: Ventricular rate ~50 bpm  R wave: 11.1 mV    Impedance:  A: 399 ohms  V: 589 ohms     Capture threshold  V: 1.5 V @ 0.8 ms     Since 12/12/2017     Tachy episode summary:  VT/VF: 0     AP: 0%  VP: 99%     Programmed changes:   Increased lower rate from 60 bpm to 70 bpm per patient???s request  Adjusted activity threshold from Low to Medium/Low     Settings:   Mode: VVIR  Rate: Lower/Upper Sensor: 70/150 bpm  RV Output: 3.5 V @ 0.8 ms  RV Sensitivity: 2.0 mV    Assessment  1. Patient is not pacemaker dependent at this time, underlying ventricular rate ~50 bpm.  2. Normal pacemaker function.  3. No ventricular high rates noted.  4. Patient complaining of racing heart rate with activity since implant on 12/12/17.     Plan:  1. Changes made: Increased lower rate from 60 bpm to 70 bpm per patient???s request; previous device set at a rate of 70 bpm.  Adjusted activity threshold from Low to Medium/Low as per previous settings; walk test performed, patient stated significant improvement.  2. Follow up with Dr. Carollee Herter, currently scheduled for 05/06/2018. 3. Patient will send [employer] transmission once the transmitter is received.    Shanon Payor, RN

## 2017-12-20 MED ORDER — CHLORHEXIDINE GLUCONATE 4 % EX LIQD
Freq: Every day | TOPICAL | 0 refills | Status: AC | PRN
Start: 2017-12-20 — End: 2018-05-14

## 2017-12-24 NOTE — Op Note
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  Status post modified Fontan procedure.  Status post Fontan revision on 11/21/2007.  Insertion of pacemaker generator on 12/05/2007.  Status post left hemidiaphragm plication.  Bradycardia with almost consistent pacing of the ventricle.  Failure of the atrial lead.  Pacemaker generator nearing end of life.    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  Status post modified Fontan procedure.  Status post Fontan revision on 11/21/2007.  Insertion of pacemaker generator on 12/05/2007.  Status post left hemidiaphragm plication.  Bradycardia with almost consistent pacing of the ventricle.  Failure of the atrial lead.  Pacemaker generator nearing end of life.    NAME OF OPERATION:  Removal of previously-placed pacemaker generator. Insertion of new pacemaker generator in the subxiphoid, subcostal area.    SURGEON:  Richardo Hanks, MD [phone])    FIRST ASSISTANT SURGEON:  Mellody Life    SECOND ASSISTANT:  Bethann Goo    INDICATIONS:  This 42 year old Duran, who had previously undergone a Fontan procedure, followed by Fontan revision, developed bradycardia, and a pacemaker was inserted in 2009. She has bradycardia with consistent ventricular pacing. The atrial lead which was previously placed is not functioning. The pacemaker generator was nearing end of life, and it was, therefore, decided to replace it. A decision was reached with Dr. Charlesetta Garibaldi and the patient not to insert a new atrial lead which would most likely require a thoracotomy.     The risks, alternatives, and potential benefits of the procedure were explained to the patient and her mother who understood and accepted.    DESCRIPTION OF PROCEDURE:  After adequate general endotracheal anesthesia with the patient in the supine position, the patient was prepped with Betadine and draped in the usual fashion. The previous lower end of the median sternotomy incision below the xiphisternum was now incised. The linea alba was divided, and the pacemaker generator pocket, which was to the right of the midline, was entered. No injury was caused to the pacemaker leads. The pacemaker pocket was healthy with no evidence of infection, and the previous generator was delivered out of the pocket. The new pacemaker generator was brought onto the field. The atrial and ventricular leads were disconnected in the usual fashion, and the new generator was connected to the previous leads in the usual fashion with the full insertion of the leads into the generator and tightening of the retention screws. Vancomycin paste was placed over the surface of the generator. The pocket was irrigated with Duobiotic solution, and the generator was then replaced in the old pocket. The edges of the pocket and the linea alba were then closed with interrupted figure-of-eight sutures of 0 Vicryl. The subcutaneous tissues were closed in layers with 3-0 and 4-0 PDS suture material, and the skin was closed with a subcuticular suture of 5-0 Monocryl. A dressing was applied, and the patient was returned to the intensive care unit in good condition.    COUNTS:  Sponge count and needle count were correct.    COMPLICATIONS:  Nil.    ESTIMATED BLOOD LOSS:  Minimal.    SPECIMENS:  The previously-placed generator, which was extracted, was submitted to Pathology.    IMPLANTS USED:  The Medtronic generator was a model [id number], serial [id number]. The serial numbers of the previously-placed leads for the right atrium  were [id number], and for the right ventricle [id number]. The stimulation threshold for the right ventricle was set with a pulse width of 0.8 milliseconds at 1.4 V, 3.Ann milliamps, 511 ohms, and a P/R wave of 24 mV, and a SLEW rate of 3.8 V/second.  The settings of the pacemaker were set with a pacing mode VVIR, mode switch on, low rate 60, upper tracking rate 130, upper tracking rate 150.  The RV lead had an amplitude of 3.5 V, a pulse width of 0.8 millisecond, sensitivity of 0.9 mV, and was bipolar.    ANTIBIOTICS:  The patient received intravenous cefotaxime less than 1 hour prior to making the incision, and this was written to be continued for 24 hours.     Dr. Richardo Hanks was the primary surgeon, was present and performed all key parts of the procedure.      Richardo Hanks, MD [phone])      HL/MODL CONF#: [id number]  D: 12/24/2017 10:25:20 T: 12/24/2017 10:57:40 DOCUMENT: [id number]      * Dr. Charlesetta Garibaldi

## 2017-12-27 ENCOUNTER — Inpatient Hospital Stay: Payer: BLUE CROSS/BLUE SHIELD | Attending: Pediatric Cardiology

## 2017-12-27 ENCOUNTER — Telehealth: Payer: BLUE CROSS/BLUE SHIELD

## 2017-12-27 NOTE — Telephone Encounter
Forwarded by: Marshal Eskew Renee Kameka Whan

## 2017-12-28 NOTE — Telephone Encounter
Spoke with patient - answered her questions.

## 2018-01-01 DIAGNOSIS — Z45010 Encounter for checking and testing of cardiac pacemaker pulse generator [battery]: Secondary | ICD-10-CM

## 2018-01-30 DIAGNOSIS — Z950 Presence of cardiac pacemaker: Secondary | ICD-10-CM

## 2018-03-12 ENCOUNTER — Ambulatory Visit: Payer: BLUE CROSS/BLUE SHIELD

## 2018-03-12 DIAGNOSIS — Z9889 Other specified postprocedural states: Secondary | ICD-10-CM

## 2018-03-27 ENCOUNTER — Telehealth: Payer: BLUE CROSS/BLUE SHIELD

## 2018-03-27 NOTE — Telephone Encounter
Spoke with patient about recent Medtronic performance notification dated 03/2018. Patient is aware of the risk of early battery depletion. The patient will keep remote monitor connected at all times and will send download and/or seek medical attention for any concerning symptoms. All questions answered.

## 2018-04-02 ENCOUNTER — Inpatient Hospital Stay: Payer: BLUE CROSS/BLUE SHIELD | Attending: Pediatric Cardiology

## 2018-04-09 DIAGNOSIS — Z09 Encounter for follow-up examination after completed treatment for conditions other than malignant neoplasm: Secondary | ICD-10-CM

## 2018-04-21 ENCOUNTER — Ambulatory Visit: Payer: BLUE CROSS/BLUE SHIELD

## 2018-04-21 DIAGNOSIS — Q201 Double outlet right ventricle: Secondary | ICD-10-CM

## 2018-04-21 DIAGNOSIS — Z9889 Other specified postprocedural states: Secondary | ICD-10-CM

## 2018-05-05 ENCOUNTER — Ambulatory Visit: Payer: BLUE CROSS/BLUE SHIELD

## 2018-05-06 ENCOUNTER — Ambulatory Visit

## 2018-05-06 ENCOUNTER — Ambulatory Visit: Attending: Cardiovascular Disease

## 2018-05-06 ENCOUNTER — Ambulatory Visit: Attending: Pediatric Cardiology

## 2018-05-13 ENCOUNTER — Inpatient Hospital Stay: Attending: Cardiovascular Disease

## 2018-05-13 ENCOUNTER — Ambulatory Visit

## 2018-05-13 ENCOUNTER — Ambulatory Visit: Attending: Cardiovascular Disease

## 2018-05-13 ENCOUNTER — Inpatient Hospital Stay: Attending: Pediatric Cardiology

## 2018-05-13 ENCOUNTER — Ambulatory Visit: Attending: Pediatric Cardiology

## 2018-05-13 DIAGNOSIS — Z9889 Other specified postprocedural states: Secondary | ICD-10-CM

## 2018-05-13 DIAGNOSIS — Z950 Presence of cardiac pacemaker: Secondary | ICD-10-CM

## 2018-05-13 DIAGNOSIS — Q201 Double outlet right ventricle: Secondary | ICD-10-CM

## 2018-05-13 DIAGNOSIS — I495 Sick sinus syndrome: Secondary | ICD-10-CM

## 2018-05-13 NOTE — Progress Notes
[facility]    Reason for Visit: Routine follow up s/p cardiac catheterization on 08/08/17, in setting of DORV s/p Fontan conversion to extracardiac baffle on 11/21/07, and subsequent placement of epicardial permanent pacemaker.     HPI: Ann Duran is a 42 y.o. woman with the following cardiac history:  1. 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  2. Underwent modified Fontan procedure (patch connection of the IVC/SVC via the right atrium to MPA) at age 41 yrs at the [facility].  3. Postoperative chylothorax [phone], subsequently requiring exploration by a left thoracotomy, with ligation of lymphatic vessel and thoracic duct.  4. Suffered fatigue and exercise intolerance in November 2008, and was found to be in atrial fibrillation.  5. Underwent cardioversion at [facility] by Dr. Charlesetta Duran on 10/14/07, along with cardiac cath which revealed excellent RA/Fontan pressures with a mean of 12-81mmHg.  6. Underwent Fontan conversion on 11/21/07, involving takedown of previous Fontan and placement of an extracardiac Fontan and bidirectional Glenn shunt and placement of epicardial permanent pacing leads. Discharged on 11/29/07  7. Re-admitted on 12/03/07 for thoracentesis of large right pleural effusion, in the setting of bradycardia and junctional rhythm  8. Underwent surgical placement of abdominal pacemaker generator on 12/05/07, but atrial lead was non-capturing, left with single site ventricular pacing.  9. Discharged on 12/12/07, after aggressive diuresis in the setting of significant ascites and perineal edema.  10. Subsequent diuresis over the next 6 weeks with resolution of ascites on moderate dose lasix and moderate dose aldactone.  11. underwent left hemidiaphragm plication by Dr. Larwance Duran on 03/02/2009, unsuccessful attempt at placement of an atrial lead.  12. PPM rate set at VVI 90 bpm during the hospitalization, reduced to 60 bpm by Dr. Carollee Duran on 03/29/2009.  13. In the setting of poor chronotropic response to exercise, rate response activated by Dr. Carollee Duran in July 2011 with significantly improved exercise ability.  14. Underwent placement of Mirena IUD and D & C by Dr. Ardyth Man Duran in February 2012. No cardiovascular complications.  Mirena IUD replaced with LEEP procedure in January 2018  15. Good functional capacity with MVO2 of 24.8 (88% predicted) in August 2016.  Low normal single RV function, improves with exercise.  No exercise induced arrhythmias  16. V-paced 99% of time at rate of 70, generator nearing ERI as of May 2018 remote check, requiring monthly checks to detect ERI.   17. Liver US in Sept 2017 showed no lesions but METAVIR score was 4 consistent with cirrhosis.  18. Underwent cardiac cath on 08/08/17 by Dr. Tobie Duran, showing mean Fontan pressures of , and liver biopsy showing extensive bridging fibrosis, but no cirrhosis.  Spirinolactone dose was doubled from 25mg  daily to 50mg  daily in an attempt to reduce Fontan volume pressure.  19. Underwent pacemaker generator replacement ([employer]tr[employer]) on 12/12/17 by Dr. Larwance Duran,     INTERVAL HISTORY: Ann Duran returns today for routine follow up.  She is accompanied by her mother.  She feels well and denies any issues with fluid retention or bloating since her spirinolactone was increased from 50 to 100mg  after her October 2018 cardiac cath.  She denies palpitations.  She exercises regularly but has focused more on pilates the last few months rather than cardio.  She notices some pain around her abdominal generator when doing ab exercises.  She believes her spider veins behind her knees are worse this year.   Her periods are suppressed by Mirena IUD.  She had dental cleaning a few months ago.  She had exercise CPET today and did not take her morning sildenafil dose, which she believes negatively impacted her exercise capacity.  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  She plans to travel back to [facility] every month so she wants to maintain her ACHD care here.      Past Medical History: As above.  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  LEEP in Jan 2018.  Mirena IUD replaced in Jan 2018.  Nose bleeds s/p bipolar cauterization 10/2016    Allergies: No Known Allergies     Medications:   Current Outpatient Prescriptions:   ???  alprazolam 0.5 mg tablet, Take 0.25 mg by mouth at bedtime as needed., Disp: , Rfl:   ???  Ascorbic Acid (VITAMIN C) 1000 MG tablet, Take 1,000 mg by mouth as needed for ., Disp: , Rfl:   ???  aspirin (ASPIRIN) 81 mg EC tablet, Take 81 mg by mouth daily, Disp: , Rfl:   ???  fluticasone 50 mcg/act nasal spray, instill 1 spray into each nostril once daily, Disp: , Rfl: 0  ???  furosemide 80 mg tablet, Take 1 tablet (80 mg total) by mouth daily., Disp: 90 tablet, Rfl: 3  ???  ibuprofen (ADVIL) 200 mg tablet, Take 400 mg by mouth every six (6) hours as needed., Disp: , Rfl:   ???  LORazepam 0.5 mg tablet, Take 0.5 mg by mouth as needed for for Sleep., Disp: , Rfl:   ???  pantoprazole 20 mg DR tablet, Take 1 tablet (20 mg total) by mouth daily as needed. (Patient taking differently: Take 20 mg by mouth as needed for .), Disp: 90 tablet, Rfl: 3  ???  potassium chloride 10 MEQ tablet, Take 2 tablets (20 mEq total) by mouth two (2) times daily., Disp: 360 tablet, Rfl: 3  ???  sildenafil 20 mg tablet, Take 1 tablet (20 mg total) by mouth three (3) times daily., Disp: 270 tablet, Rfl: 3  ???  spironolactone 50 mg tablet, Take 2 tablets (100 mg total) by mouth daily., Disp: 180 tablet, Rfl: 3  ???  valacyclovir 1000 mg tablet, ValACYclovir HCl - 1 GM Oral Tablet, Disp: , Rfl:   ???  valacyclovir 500 mg tablet, ValACYclovir HCl - 500 MG Oral Tablet, Disp: , Rfl:   ???  zolpidem (AMBIEN) 5 mg tablet, Take 2.5 mg by mouth at bedtime as needed for Sleep., Disp: , Rfl:   ???  cyanocobalamin 1000 mcg tablet, Take 1,000 mcg by mouth as needed for ., Disp: , Rfl:   ???  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   Nonsmoker, occas ETOH.    FH: Mother and father in their 30s in good health. Siblings, she has1 brother who is in good health. There is no history of congenital heart disease in the family.    Ros: Negative except as described above.    Physical Exam:  VS:  BP 110/70  ~ Pulse 78  ~ Temp 36.6 ???C (97.9 ???F) (Oral)  ~ Resp  16  ~ Ht 5' 5'' (1.651 m)  ~ Wt 125 lb 6.4 oz (56.9 kg)  ~ LMP  (LMP Unknown)  ~ SpO2 93% Comment: RA ~ BMI 20.87 kg/m???   General: Pleasant thin woman, in NAD.  Skin: No rashes. Sternotomy and left thoracotomy scars well healed.  Head and Neck: Pupils are equal and reacting.   Mouth: Normal oral mucosa. Excellent dentition  Neck:Jugular venous pressure ~12cm  Chest:  Lungs clear bilaterally   Heart: Single S1 with no systolic murmurs or rubs. No diastolic murmurs or gallops.  Abdomen: Not distended, tympanic to percussion, nontender, liver not enlarged, no ascites noted.  Extremities: No edema, mild varicosity on inner thighs and lower right leg. Resolving bruise at right upper thigh  Neurologic: Alert and oriented x 4.  Psychologic: Normal mood and affect.      Imaging Data:  Echocardiogram:  05/13/18  PHYSICIAN INTERPRETATION:  CONOTRUNCAL ANATOMY: The cardiac structural malformations are consistent with a diagnosis of levo-transposition of the great arteries.  LEFT VENTRICLE: Patient demonstrated normal sinus rhythm during echocardiogram. 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  RIGHT VENTRICLE: Double out right ventricle with hypoplastic LV and large VSD with '' functional single ventricle'' Right ventricular hypertrophy. Low normal systemic RV systolic function.  LEFT ATRIUM:  RIGHT ATRIUM: Right atrial pressure is estimated at 8 mmHg. Right atrial pressure is estimated at 8 mmHg. Large secundum ASD. Status post extracardiac Fontan. Visualized portions of the Fontan appear patent, no obvious fenestration noted. Sherrine Maples shunt   appears normal.  MITRAL VALVE: No evidence of mitral valve regurgitation.  TRICUSPID VALVE: Trace tricuspid valve regurgitation.  AORTIC VALVE: The aortic valve was not well visualized. Aortic valve area was not quantified by continuity equation on this study. Aorta is anterior of the pulmonic valve.  No evidence of aortic regurgitation.  PULMONIC VALVE: No indication of pulmonic valve regurgitation. Pulmonic is posterior of the aorta.  AORTA: Normal aortic arch, normal descending aorta, normal mid ascending aorta and the aortic root is normal in size and structure. No coarctation of the aorta.  PULMONARY ARTERY: The pulmonary artery is not well seen.  SYSTEMIC VEINS: The inferior vena cava is normal in size and exhibits less than 50% respiratory change.  PERICARDIUM: There is no evidence of pericardial effusion.  CONCLUSIONS:   1. L-transposition of the great arteries.   2. 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 Low normal systemic RV systolic function.   3. Double outlet right ventricle-doubly commited ventricular septal defect-aorta anterior to pulmonary artery-no left ventricular outflow tract obstruction.   4. 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   5. Large secundum ASD. Status post extracardiac Fontan. Visualized portions of the Fontan appear patent, no obvious fenestration noted. Sherrine Maples shunt appears normal.   6. A prior echo performed on 05/07/2017 was reviewed for comparison. No significant changes noted since the previous study. Stress Echo/CPX:  05/13/18  BASELINE ECG:   Ventricular paced with biphasic T wave in lead V2  PROTOCOL: Bruce. (Treadmill advanced to stage 3 on its own during stage 2 of exercise after 1')  CONTROL:   HR:  72    BP: 110/64     O2 Sat:  98% (forehead) 92% (finger)     START:  1.7 MPH at 10% grade.   STOP: 11.4 METS with 3.4 mph at 14% grade x 2???37 after 7???37'' total exercise time due to shortness of breath.  Peak HR: 129      Peak BP: 128/66         Peak O2 Sat:  94%   Peak Double-Product: 16,512    Maximum VO2 = 1391 ml which is 77% of predicted, maximum VO2/kg = 23.7 ml/kg which is 85% of predicted (Note: The reference values are adjusted for weight and modality of exercise)  VE/VO2 =  40        VE/VCO2 = 36      VE/VCO2 slope = 30.1  VO2/HR =  11.1      which is 111% predicted      Breathing Reserve =    41              RQ= 1.12  Anaerobic Threshold was reached at HR = 103    VO2 =  1144 ml     VO2/kg =  19.5 ml/kg  RESULTS:   Symptoms:  Shortness of breath and leg fatigue, denied chest pain  ST-T Changes: Uninterruptable in the presence of ventricular pacing.  Dysrhythmias:  Rare PVCs during exercise  BP Response:  Blunted.  IMPRESSIONS: Fair exercise tolerance achieving 71% maximum predicted heart rate, limited by shortness of breath. ST changes during exercise are uninterpretable in the presence of ventricular pacing. No chest pain. Rare PVCs during exercise. Blunted BP at a low double product. Maximum VO2/kg = 23.7 ml/kg which is 85% of predicted. VE/VCO2 slope = 30.1 which is 119% of predicted. Compared with previous stress/CPX of 06/21/15 exercise tolerance has decreased from 12.7 METS, MVO2/kg had been 24.8, VE/VCO2 slope had been 23.1.  BASELINE:  Baseline: Single ventricle with low normal systolic function.     ADDITIONAL BASELINE FINDINGS:  LEFT VENTRICLE: Global left ventricular systolic function is mildly decreased (LVEF 40-49%).  MITRAL VALVE: Trace mitral valve regurgitation. TRICUSPID VALVE: Trace tricuspid valve regurgitation.     Global left ventricular function increased appropriately with stress. No new segmental wall motion abnormalities were seen. Global left ventricular systolic function at peak stress is lower limits of normal (LVEF 50-55%). Mild mitral valve regurgitation.   Mild tricuspid regurgitation. Post-exercise: mild increase in single ventricle contractility.     SUMMARY:   1. Please see separately resulted cardiopulmonary exercise test for details of exercise performance.   2. DORV, L-TGA, hypoplastic LV, large VSD, large ASD.   3. Baseline: Single ventricle with low normal systolic function.   4. Post-exercise: mild increase in single ventricle contractility    Liver US:  05/13/18  IMPRESSION:  1. Irregular liver contour and coarsened in echotexture representing underlying chronic liver disease/fibrosis. No focal mass. Cholelithiasis. Splenomegaly supports portal hypertension. Normal liver Doppler.  2. Shear wave liver stiffness measurement indicating moderate to severe fibrosis (MetaVir stages F2-F3). Note, the morphologic appearance of the liver appears more fibrotic than Elastography measurements indicate.    Cardiac Cath:  08/08/17      Liver US  07/01/17 (outside report) shows no lesions, Metavir score of 4.  Echocardiogram:  05/07/17 PHYSICIAN INTERPRETATION:  CONOTRUNCAL ANATOMY: The cardiac structural malformations are consistent with a diagnosis of levo-transposition of the great arteries.  LEFT VENTRICLE: Visually estimated left ventricular ejection fraction 55-60%. Patient demonstrated normal sinus rhythm during echocardiogram. 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  RIGHT VENTRICLE: Double out right ventricle with hypoplastic LV and large VSD with '' functional single ventricle'' Right ventricular hypertrophy. Low normal systemic RV systolic function.  RIGHT ATRIUM: Right atrial pressure is estimated at 8 mmHg. Right atrial pressure is  estimated at 8 mmHg. Large secundum ASD. Status post extracardiac Fontan. Visualized portions of the Fontan appear patent, no obvious fenestration noted. Sherrine Maples shunt   appears normal.  MITRAL VALVE: No evidence of mitral valve regurgitation.  TRICUSPID VALVE: Mild tricuspid valve regurgitation.  AORTIC VALVE: The aortic valve was not well visualized. Aorta is anterior of the pulmonic valve.  No evidence of aortic regurgitation.  PULMONIC VALVE: No indication of pulmonic valve regurgitation. Pulmonic is posterior of the aorta.  AORTA: Normal aortic arch and normal descending aorta. The ascending aorta was not well visualized. Mildly enlarged aortic root (4.2 cm). No coarctation of the aorta.  PULMONARY ARTERY: The pulmonary artery is not well seen.  SYSTEMIC VEINS: The inferior vena cava is normal in size and exhibits less than 50% respiratory change.  PERICARDIUM: There is no evidence of pericardial effusion.  CONCLUSIONS:   1. L-transposition of the great arteries.   2. Double outlet right ventricle-doubly commited ventricular septal defect-aorta anterior to pulmonary artery-no left ventricular outflow tract obstruction.   3. 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 Low normal systemic RV systolic function.   4. Large secundum ASD. Status post extracardiac Fontan. Visualized portions of the Fontan appear patent, no obvious fenestration noted. Sherrine Maples shunt appears normal.   5. Left ventricular ejection fraction is approximately 55-60%.   6. Mildly enlarged aortic root (4.2 cm).   7. Mild tricuspid valve regurgitation.   8. A prior echo performed on 03/09/2016 was reviewed for comparison. No significant changes noted since the previous study.   9. 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     ECG 11/07/16: Ventricular paced. Ventricular rate of 75 bpn, QRS duration 184 ms. QT/QTc 498/556 ms Liver Ultrasound 07/27/16 per report from OSH: Liver 17.5 cm in length. It is normal in echodensity. Liver contour is smooth. No focal lesion os identified. Hepatopedal flow is identifies in the main, right, and left portal veins. Gallbladder and biliary tree: There are several small mobile gallstones. There is no focal tenderness. The gallbladder wall is thickened measuring 0.7 cm. There is no pericholecystic fluid. No intra or extrahepatic biliary ductal dilatation.      Echocardiogram 03/09/16: CONOTRUNCAL ANATOMY: The cardiac structural malformations are consistent with a diagnosis of levo-transposition of the great arteries. The observed structural malformations are consistent with the diagnosis of double outlet right ventricle with doubly???commited ventricular septal defect. The aorta is anterior to the pulmonary artery. There is no sub-aortic obstruction of left ventricular outflow tract. LEFT VENTRICLE: Normal left ventricular size. Visually estimated left ventricular ejection fraction 55-60%. Small left ventricular size. RIGHT VENTRICLE: (DTI 6.0 cm/s). 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 Low normal systemic RV systolic function. LEFT ATRIUM: Mild left atrial enlargement. RIGHT ATRIUM: Right atrial pressure is estimated at 8 mmHg. Large secundum ASD. Status post extracardiac Fontan. Visualized portions of the Fontan appear patent, no obvious fenestration noted. Sherrine Maples shunt appears normal. MITRAL VALVE: No evidence of mitral valve regurgitation. TRICUSPID VALVE: Mild tricuspid valve regurgitation. AORTIC VALVE: The aortic valve is trileaflet and structurally normal, with normal leaflet excursion. Aorta is anterior of the pulmonic valve. No evidence of aortic regurgitation. PULMONIC VALVE: No indication of pulmonic valve regurgitation. Pulmonic is posterior of the aorta. AORTA: Normal mid ascending aorta and normal aortic arch. Mildly enlarged aortic root (3.9 cm). SYSTEMIC VEINS: The inferior vena cava is normal  in size and exhibits less than 50% respiratory change. PERICARDIUM: There is no evidence of pericardial effusion. ?????? CONCLUSIONS: ???1. 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 Low normal systemic RV systolic function. ???2. Left ventricular ejection fraction is approximately 55-60%. ???3. Double outlet right ventricle. ???4. Double outlet right ventricle ????????? -doubly commited ventricular septal defect ????????? -aorta anterior to pulmonary artery ????????? -no left ventricular outflow tract obstruction. ???5. L-transposition of the great arteries. ???6. Mild left atrial enlargement. ???7. Large secundum ASD. Status post extracardiac Fontan. Visualized portions of the Fontan appear patent, no obvious fenestration noted. Sherrine Maples shunt appears normal. ???8. Mild tricuspid valve regurgitation. ???9. Mildly enlarged aortic root (3.9 cm).    Abdominal US 06/21/15:  The pancreas is partially visualized and grossly unremarkable. The liver is mildly heterogeneous in echogenicity. There is no focal liver lesion. Portal vein demonstrates hepatopetal flow. No intra- or extrahepatic biliary dilation. The common bile duct is normal in caliber. The gallbladder is normally distended, containing gallstones. No gallbladder wall thickening or pericholecystic fluid. No sonographic Murphy's sign. The spleen is mildly enlarged. The kidneys are normal in size. Both kidneys demonstrate normal cortical thickness and echogenicity. No hydronephrosis. There is no ascites. The visualized proximal aorta and IVC are unremarkable. MEASUREMENTS: Liver: 15.1 cm Common Duct: 3 mm Right Kidney: 10.5 cm Left Kidney: 11.5 cm Spleen: 17 cm Aorta: 1.1 cm SHEAR WAVE LIVER STIFFNESS MEASUREMENTS: Mean 1.63 +/-0.12 m/sec, Median 1.66 m/sec, equating to 5.7-12 kPA. REFERENCE (m/s, kPa): Normal: 0.81-1.22 m/s 2.0-4.5 kPa (METAVIR F0) Normal/mild: 1.22-1.37 m/s 4.5-5.7 kPa (METAVIR F0-F1) Mild/moderate: 1.37-2 m/s 5.7-12.0 kPa (METAVIR F2-F3) Moderate/severe: 2-2.64 m/s 12.0-21.0 kPa (METAVIR F3-F4) Severe: >2.64 m/s >21.0 kPa (METAVIR F4) IMPRESSION: 1. Heterogenous liver echogenicity, suggesting diffuse liver disease. No suspicious liver lesions. Patent hepatic vasculature. 2. Shear wave liver stiffness measurement indicating mild/moderate liver fibrosis, MetaVir score of F2-3. 3. Splenomegaly. 4. Cholelithiasis without evidence of acute cholecystitis.    Echocardiogram 06/21/15:  2D AND M-MODE MEASUREMENTS (normal ranges within parentheses): Aorta/Left Atrium: Aortic Root, d (2D): 2.8 cm LV DIASTOLIC FUNCTION: MV Peak E: 1.61 m/s E/e' Ratio: 40.2 LV IVRT: 134 msec Decel Time: 229 msec Right Ventricle: TAPSE: 1.0 cm Aortic Valve: AoV Max Vel: 1.05 m/s AoV Peak PG: 4 mmHg AoV Mean PG: Tricuspid Valve and PA/RV Systolic Pressure: TR Max Velocity: 3.2 m/s RA Pressure: 8 mmHg RVSP/PASP: 49 mmHg Pulmonic Valve: PV Max Velocity: 0.8 m/s PV Max PG: 2 mmHg PV Mean PG: Aorta: Ao Asc: 2.4 cm. PHYSICIAN INTERPRETATION: CONOTRUNCAL ANATOMY: The cardiac structural malformations are consistent with a diagnosis of levo-transposition of the great arteries. 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 The aorta is anterior to the pulmonary artery. There is no sub-aortic obstruction of left ventricular outflow tract. LEFT VENTRICLE: Visually estimated left ventricular ejection fraction 55-60%. Small left ventricular size. RIGHT VENTRICLE: TAPSE 1.0 cm, (DTI 6.0 cm/s). 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 Low normal systemic RV systolic function. RIGHT ATRIUM: Right atrial pressure is estimated at 8 mmHg. Large secundum ASD. Status post extracardiac Fontan. Visualized portions of the Fontan appear patent, no obvious fenestration noted. Unable to obtain images of Glenn shunt. MITRAL VALVE: No evidence of mitral valve regurgitation. TRICUSPID VALVE: Mild tricuspid valve regurgitation. Tricuspid regurgitation velocity is not well seen. AORTIC VALVE: The  aortic valve is trileaflet and structurally normal, with normal leaflet excursion. Aorta is anterior of the pulmonic valve. No evidence of aortic regurgitation. PULMONIC VALVE: No indication of pulmonic valve regurgitation. Pulmonic is posterior of the aorta. AORTA: The aortic root is normal in size and structure, normal mid ascending aorta and normal aortic arch. SYSTEMIC VEINS: The inferior vena cava is not well visualized. PERICARDIUM: There is no evidence of pericardial effusion. CONCLUSIONS: 1. Small left ventricular size 2. 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 3. L-transposition of the great arteries. 4. Mild tricuspid valve regurgitation. 5. 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 Low normal systemic RV systolic function.     CPEX/Echo 06/11/15:  BASELINE: Low normal systemic RV systolic function at rest. Hypoplastic LV. ADDITIONAL BASELINE FINDINGS: MITRAL VALVE: Trace mitral valve regurgitation. TRICUSPID VALVE: Trace tricuspid valve regurgitation. No new segmental wall motion abnormalities were seen. Global left ventricular systolic function at peak stress is normal (LVEF 60-65%). Improved systemic RV systolic function with stress. No increase in degree of atrioventricular valve regurgitation with exercise. SUMMARY: 1. Improved systemic RV systolic function with stress. No increase in degree of atrioventricular valve regurgitation with exercise. 2. Low normal systemic RV systolic function at rest. Hypoplastic LV. BASELINE ECG: Ventricular paced rhythm PROTOCOL: Bruce. CONTROL: HR: 74 BP: 120/74 O2 Sat: 95 % START: 1.7 MPH at 10% grade. STOP: 12.7 METS with 4.2 mph at 16 % grade x 1???33'' after 10???33'' total exercise time due to shortness of breath. Peak HR: 141 Peak BP: 136/56 Peak O2 Sat: 95 % Peak Double-Product: 19,176 Maximum VO2 = 1386 ml which is 77 % of predicted, maximum VO2/kg = 24.8 ml/kg which is 88 % of predicted (Note: The reference values are adjusted for weight and modality of exercise) VE/VO2 = 35 VE/VCO2 = 31 VE/VCO2 slope = 23.1 VO2/HR = 9.8 which is 99 % predicted Breathing Reserve = 50 RQ= 1.13 Anaerobic Threshold was reached at HR = 103 VO2 = 1094 ml VO2/kg = 19.6 ml/kg RESULTS: Symptoms: Shortness of breath and leg fatigue; no chest pain or discomfort. ST-T Changes: Uninterpretable due to paced rhythm. Dysrhythmias: Rare PVC during exercise BP Response: Appropriate. IMPRESSIONS: Good exercise tolerance achieving 77 % maximum predicted heart rate, limited by shortness of breath. Exercise induced ST changes are uninterpretable due to paced rhythm. No exercise induced chest pain at a low double product. Rare PVCs as described above. Maximum VO2 = 1386 ml which is 77 % of predicted, maximum VO2/kg = 24.8 ml/kg which is 88 % of predicted . VE/VCO2 slope = 23.1 which is 92 % of predicted. Compared with previous stress/CPX of 04/24/13 maximum VO2/kg had been 25.0 at 11.9 METS    Echocardiogram:  04/21/13: CONCLUSIONS:  1. Double outlet right ventricle.  2. Moderately enlarged right ventricular size and low normal systolic function. 3. Moderately increased RV wall thickness. 4. Mild tricuspid regurgitation. 5. Status post extracardiac Fontan, appears widely patent, no fenestration noted. Sherrine Maples shunt not well visualized. LABWORK:    08/08/17  Creat 0.72, BUN 12, K 3.8  07/01/17  Chol 121, trig 58, HDL 40, LDL 69, Hgb A1c 5.4, INR 1.2, prealbumin 24, TSH 3.6, Hct 43.2, Plat 82k  07/26/16: WBC 3.0, HGB 4.69, HCT 40.7, PLT 75, NA 137, K 3.8, BUN 13, CREAT 0.7, ALK PHOS 57, AST 30, ALT 38, TBil 1.3, GGT 100,  PREALB 24, BNP 113, INR 1.2, TSH 4.24, FT3 3.6, FT4 1.05.  04/01/13:  Hgb 14.7, Hct 42.4, MCV 85.1, plat ct 106k, Na 143, K 4.2, creat 0.8, BUN 19, AST 36, ALT 32    Impressions:  1. 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 Richardo Hanks, including bi-atrial Maze procedure and placement of a permanent atrial pacing lead.  2. Postoperative junctional rhythm and bradycardia: re-admitted 4 days after discharge with large right pericardial effusion requiring thoracentesis and placement of an abdominal pacemaker generator and epicardial ventricular lead on 12/05/07 (non-functioning atrial lead).  3. Ascites, improved on aldactone. No evidence of ascites on annual abdominal US.  4. Longstanding history of irregular and heavy menses, thus far unsuccessfully regulated on several types of hormonal therapy. Better controlled on Mirena since 2012.  Also has cervical HPV.  Now with CIN III, underwent LEEP procedure in Jan 2018  5. History of atrial fibrillation, but no post Fontan revision tachyarrhythmias thus far, formerly on coumadin (possible side effect of hair loss), now on ASA.  6. Longstanding elevated left hemidiapragm, s/p successful plication on 03/02/2009 by Dr. Larwance Duran.  7. Liver US in 2014, 2016 and 2017, and 2018 shows no lesions, but 2017 and 2018 outside liver US report Metavir score of 4, which is consistent with cirrhosis.  2019 liver US at [facility] showed MetaVir stage F2-F3, although elastography impacted by congestive hepatopathy.   8. Cardiac cath and liver biopsy on 08/08/17, with mean Fontan pressure under sedation of , and liver biopsy showing extensive bridging fibrosis but no cirrhosis.  Spirinolactone was increased from 25 to 50mg  to 100mg  after this cath  9. Ventricular pacemaker, pacing 99% of time.   Generator replaced on 12/12/17 by Dr. Larwance Duran. Deferred placement of transvenous atrial lead via her LPA to minimize V pacing, based on Rodnisha's wish not to be committed to anticoagulation  10. Stable exercise capacity in July 2019, with MVO2 23.7 (85% predicted), previously 24.8 and 88% predicted in 2016    [facility]    Labs (BMP, BNP, LFTs, GGT, AFP, INR, CBC, Prealbumin):  [x]  Within last year (date: 07/01/17)  [x]  Ordered today  []  Deferred    Hep C screening  [x]  Prior HCV Ab negative (date: 06/21/15)  []  Not indicated (no surgery prior to early 1990s)  []  Ordered    Last hemodynamic cath:   [x]  Within last 10 years (date: 08/08/17), see above results (prior to Fontan revision)  []  Deferred. Reason:   []  Scheduled    Last Liver biopsy  [x]  Within last 10 years (date:08/08/17 ), see above results  []  Scheduled with cath    Liver Imaging:  []  MRI Abdomen within last 5 years (date: ), see above  []  Triple phase CT within last 5 years (date: ), see above  [x]  Liver ultrasound completed (date: 05/13/18), see above  []  Ordered today, see above.     Advanced cardiac imaging:  []  Cardiac MRI (date: ), see above  [x]  Cardiac CT (date: 12/12/2007), see above  [x]  Deferred. Reason: Pacemaker  []  Ordered today, see above     PDE-5 Inhibitor recommended:  [x]  On sildenafil []  On tadalafil  []  Not tolerated. Previously on []  sildenafil (Year: )  []  tadalafil (Year: )  Side effect:  []  Deferred. Reason:   []  Ordered today, see above    Echo  [x]  Within last year (date: 05/13/18, see above results  []  Deferred. Reason:   []  Ordered today    Cardiopulmonary exercise test [x]  Within last 3 years (date: 05/13/18), see above results  []  Deferred. Reason:   []  Ordered today    Advanced care planning  [x]  Date: Advanced directive in CC from 12/27/2010  []  Deferred. Reason:       Recommendations:   1. Reassured Selam of stable echo and exercise testing.   In addition her liver US was stable with no ascites, no liver lesions, and at least moderate liver fibrosis.    2. Continue ASA 81mg ,  furosemide 80 daily, spirinolactone 100mg ,  And sildenafil 20mg  TID     3. Continue regular exercise as tolerated, as well as diaphragmatic conditioning exercises with inspirometer.    4. Continue regular pacemaker surveillance with Dr. Carollee Duran.  We discussed the option of using transvenous leads in her LPA, with screw in lead to roof of atrium, but this would require anticoagulation longterm.   She was reluctant to consider this, since she bruises readily with ASA.  She understands that ultimately this will be required to prevent degeneration of ventricular function from single site pacing.     5. Continue follow-up with Dr. Renato Battles, per patient's preference.  6. She will obtain updated comprehensive labwork in the coming week fasting at [facility], orders entered.  7. In the absence of interval symptoms, follow up in one year with echo, liver US, and labwork.       Patient was examined and discussed with  Dr. Dyanne Iha.    I have seen and examined the patient and I agree with the findings, assessment, and recommendations made above which we developed together and communicated with the patient.      CC:  Dr. Elmer Picker

## 2018-05-13 NOTE — Procedures
SEE MEDIA SECTION FOR PROGRAMMER PRINTOUT   [facility] EP Device Clinic Pacemaker  ???  PATIENT: Ann Duran  MRN: [id number]  DOB: 11/14/1975  ???  DATE OF PROCEDURE: 05/13/2018  TIME OF PROCEDURE: 1457  ???  INDICATIONS:???Pacemaker evaluation for arrhythmias  PROCEDURE:???Pacemaker evaluation  ???  Location: [facility] #330  ???  Technician: Shanon Payor, RN  ???  READING PHYSICIAN:??????Dr. Charlesetta Garibaldi  ???  Device:  Device Manufacturer: Medtronic  Device Model Name: Jamse Mead DR MRI  Model Number: [id number]  Device Serial Number: [id number]  Device Implant Date: 12/12/2017  Indication for Implant:   ???  RA lead:   Manufacturer: Medtronic   Model Number: [id number]  Serial Number: [id number]  Date of Implant: 12/05/2007  ???  RV lead:  Manufacturer: Medtronic   Model Number: [id number]  Serial Number: [id number]  Date of Implant: 12/05/2007  ???  Battery: 7.0 years  ???  Underlying Rhythm: Ventricular rate ~50 bpm  R wave: 11.6 mV  ???  Impedance:  A: 399 ohms  V: 551 ohms  ???  Capture threshold  V: 2 V @ 0.8 ms  ???  Since 12/19/2017  ???  Tachy episode summary:  VT/VF: 0  ???  AP: 0%  VP: 99.8%  ???  Programmed changes:   None  ???  Settings:   Mode: VVIR  Rate: Lower/Upper Sensor: 70/150 bpm  RV Output: 3.5 V @ 0.8 ms  RV Sensitivity: 2.0 mV  ???  Assessment  1. Patient is not pacemaker dependent at this time, underlying ventricular rate ~50 bpm.  2. Normal pacemaker function.  3. No ventricular high rates noted.  ???  Plan:  1. Patient seen today by Dr. Charlesetta Garibaldi, see separate EP note.  2. No programming changes made.  3. Remote monitoring every 3 months and return in 1 year for device evaluation and follow up with Dr. Carollee Herter.  ???  Shanon Payor, RN

## 2018-05-31 NOTE — Progress Notes
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    Subjective:   Ann Duran is a 42 y.o. female who was seen in clinic for pacemaker evaluation. He cardiac history is remarkable foir a diagnosis of L-transposition/ single ventricle. Her first cardiac surgery was a Modified RA to pA Fonatn procedure at age 34. She required a thoracic duct ligation after her Fontan and then reportedly did well until age 33 when she presented with a complaint of fatigue and was diagnosed with atrial arrhythmias. She then underwent a Fontan conversion in Jan 2009 at which time pacemaker leads were placed but the device was placed 2 weeks later after she developed bradycardia and fluid retention. The atrial lead was non functional and so she was VVI paced. She underwent attempted atrial lead revision with diaphragm plication in April of 2010, but the new atrial lead also failed to function. She was tried on VVI backup pacing but was eventually changed to VVIR at 70 bpm lower rate, which she has tolerated well since 2011, but has had increasing requirement for ventricular pacing.    Interval Events:  Halina has been doing well.  She recently returned from a work trip in [place].  She has not noticed any changes in her exercise capacity.  She denies any recent palpitations, dizziness, syncope, chest pain or shortness of breath.  She was recently started on Eliquis by the Adult Congenital Team.  She is in clinic today for discussion of generator replacement and possible solutions for atrial pacing.    Past Medical History:  Past Medical History:   Diagnosis Date   ??? Anxiety     controlled per pt   ??? Cholelithiases Noted on ultrasound, asymptomatic    ??? Delayed emergence from general anesthesia 2010    pacemaker insertion, per patient   ??? DORV (double outlet right ventricle)    ??? GERD (gastroesophageal reflux disease)     well controlled w/med and diet   ??? History of blood transfusion    ??? HPV (human papilloma virus) infection    ??? IUD (intrauterine device) in place     Mirena placed 12/27/10, replaced 12/2015, replaced November 16, 2016   ??? 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  Negative tobacco use.  Rare EtOH.    Review of Systems:  14 point review of system negative other than as stated above    Medications:  Outpatient Medications Prior to Visit   Medication Sig Dispense Refill   ??? alprazolam 0.5 mg tablet Take 0.25 mg by mouth at bedtime as needed.     ??? Ascorbic Acid (VITAMIN C) 1000 MG tablet Take 1,000 mg by mouth as needed for .     ??? aspirin (ASPIRIN) 81 mg EC tablet Take 81 mg by mouth daily     ??? cyanocobalamin 1000 mcg tablet Take 1,000 mcg by mouth as needed for .     ??? fluticasone 50 mcg/act nasal spray instill 1 spray into each nostril once daily  0   ??? furosemide 80 mg tablet Take 1 tablet (80 mg total) by mouth daily. 90 tablet  3   ??? ibuprofen (ADVIL) 200 mg tablet Take 400 mg by mouth every six (6) hours as needed.     ??? LORazepam 0.5 mg tablet Take 0.5 mg by mouth as needed for for Sleep.     ??? Omega-3 Fatty Acids (FISH OIL) 500 mg CAPS capsule Take 500 mg by mouth as needed for.     ??? pantoprazole 20 mg DR tablet Take 1 tablet (20 mg total) by mouth daily as needed. (Patient taking differently: Take 20 mg by mouth as needed for .) 90 tablet 3   ??? potassium chloride 10 MEQ tablet Take 2 tablets (20 mEq total) by mouth two (2) times daily. 360 tablet 3   ??? sildenafil 20 mg tablet Take 1 tablet (20 mg total) by mouth three (3) times daily. 270 tablet 3   ??? spironolactone 50 mg tablet Take 2 tablets (100 mg total) by mouth daily. 180 tablet 3 ??? valacyclovir 1000 mg tablet ValACYclovir HCl - 1 GM Oral Tablet     ??? valacyclovir 500 mg tablet ValACYclovir HCl - 500 MG Oral Tablet     ??? zolpidem (AMBIEN) 5 mg tablet Take 2.5 mg by mouth at bedtime as needed for Sleep.       No facility-administered medications prior to visit.      Allergies:  No Known Allergies      Objective:     Vitals:  BP 110/70 Comment: Vitals from earlier appt ~ Pulse 78  ~ Temp 36.6 ???C (97.9 ???F) (Oral)  ~ Resp 16  ~ Ht 1.651 m (5' 5'')  ~ Wt 56.9 kg (125 lb 7.1 oz)  ~ LMP  (LMP Unknown)  ~ SpO2 93%  ~ BMI 20.87 kg/m???  Facility age limit for growth percentiles is 20 years. Facility age limit for growth percentiles is 20 years. Facility age limit for growth percentiles is 20 years.    General:   alert and no distress   Skin:   warm and dry, no hyperpigmentation, vitiligo, or suspicious lesions   Nodes:   cervical, supraclavicular, and axillary nodes normal.   Eyes:   No conjunctival injection, sclerae anicteric   Oropharynx:  lips, mucosa, and tongue normal; teeth and gums normal   Neck:  no adenopathy, no carotid bruit, no JVD, supple, symmetrical, trachea midline and thyroid not enlarged, symmetric, no tenderness/mass/nodules   Lungs:   clear to auscultation bilaterally   Heart:   regular rate and rhythm, S1: single and S2: normal   Abdomen:   soft, non-tender; bowel sounds normal; no masses,  no organomegaly   Extremities:  extremities normal, atraumatic, no cyanosis or edema   Psychiatric:   normal mood, behavior, speech, dress, and thought processes     Pacemaker Evaluation 10/23/17:  Underlying Rhythm: Bradycardia ~50???s bpm  Underlying Rhythm: Ventricular rate ~50 bpm  R wave: 11.6 mV  Impedance:  V: 551 ohms???  Capture threshold  V: 2 V @ 0.8 ms  ???  Tachy episode summary since 12/19/2017:  VT/VF: 0  ???  AP: 0%  VP: 99.8%  ???  Programmed changes: None  ???  Settings:   Mode: VVIR  Rate: Lower/Max Track: 70/ 150 bpm  RV Output: 3.5 V @ 0.52 ms  RV Sensitivity: 2.80 mV    Assessment: 42 y.o. female with DORV status post Fontan conversion to extracardiac baffle on 11/21/2007, with subsequent placement of an epicardial pacemaker for sinus node dysfunction.  She had atrial lead failure shortly after initial placement.  In 2010 she underwent an unsuccessful attempt at placement of a new atrial lead.    She returns today for routine follow up. She underwent pacemaker generator replacement (Medtronic) on 12/12/17 by Dr. Larwance Sachs. She remains in VVI mode. She continues to be stable with ventricular pacing, no indicatioon for intervention at this time.    Plan/ Recommendation:     Remote monitoring every three months and return to clinic in one year.    Vania Rea. Carollee Herter M.D.  Pediatric Cardiology

## 2018-06-09 MED ORDER — SILDENAFIL CITRATE 20 MG PO TABS
ORAL_TABLET | 2 refills | Status: AC
Start: 2018-06-09 — End: 2018-07-09

## 2018-07-08 ENCOUNTER — Ambulatory Visit

## 2018-07-08 DIAGNOSIS — I2721 Secondary pulmonary arterial hypertension: Secondary | ICD-10-CM

## 2018-07-08 MED ORDER — FUROSEMIDE 80 MG PO TABS
80 mg | ORAL_TABLET | Freq: Every day | ORAL | 3 refills | Status: AC
Start: 2018-07-08 — End: 2019-06-09

## 2018-07-08 MED ORDER — PANTOPRAZOLE SODIUM 20 MG PO TBEC
20 mg | ORAL_TABLET | Freq: Every day | ORAL | 3 refills | Status: AC | PRN
Start: 2018-07-08 — End: ?

## 2018-07-08 MED ORDER — SPIRONOLACTONE 50 MG PO TABS
100 mg | ORAL_TABLET | Freq: Every day | ORAL | 3 refills | Status: AC
Start: 2018-07-08 — End: 2019-07-16

## 2018-07-08 MED ORDER — POTASSIUM CHLORIDE CRYS ER 10 MEQ PO TBCR
20 meq | ORAL_TABLET | Freq: Two times a day (BID) | ORAL | 3 refills | Status: AC
Start: 2018-07-08 — End: ?

## 2018-07-08 MED ORDER — SILDENAFIL CITRATE 20 MG PO TABS
ORAL_TABLET | 3 refills | Status: AC
Start: 2018-07-08 — End: ?

## 2018-07-08 MED ORDER — POTASSIUM CHLORIDE CRYS ER 10 MEQ PO TBCR
20 meq | ORAL_TABLET | Freq: Two times a day (BID) | ORAL | 3 refills | Status: AC
Start: 2018-07-08 — End: 2018-07-09

## 2018-07-16 ENCOUNTER — Inpatient Hospital Stay: Attending: Pediatric Cardiology

## 2018-07-16 DIAGNOSIS — Z950 Presence of cardiac pacemaker: Secondary | ICD-10-CM

## 2018-07-23 ENCOUNTER — Ambulatory Visit

## 2018-07-23 DIAGNOSIS — I2721 Secondary pulmonary arterial hypertension: Secondary | ICD-10-CM

## 2018-07-23 MED ORDER — REVATIO 20 MG PO TABS
20 mg | ORAL_TABLET | Freq: Three times a day (TID) | ORAL | 3 refills | Status: AC
Start: 2018-07-23 — End: ?

## 2018-10-15 ENCOUNTER — Inpatient Hospital Stay: Payer: BLUE CROSS/BLUE SHIELD | Attending: Pediatric Cardiology

## 2019-01-14 ENCOUNTER — Inpatient Hospital Stay: Payer: BLUE CROSS/BLUE SHIELD | Attending: Pediatric Cardiology

## 2019-04-07 ENCOUNTER — Ambulatory Visit: Payer: PRIVATE HEALTH INSURANCE

## 2019-04-10 ENCOUNTER — Telehealth: Payer: BLUE CROSS/BLUE SHIELD

## 2019-04-10 DIAGNOSIS — R42 Dizziness and giddiness: Secondary | ICD-10-CM

## 2019-04-10 NOTE — Telephone Encounter
Patient received new remote monitor and set it up today. Patient has chronic high RV thresholds. Patient has been C/O intermittent dizziness for the past several weeks now that happens at all times of the day. I will arrange to mail her a CAM monitor and follow up with a video visit in 6 weeks with Dr Larene Beach.

## 2019-04-10 NOTE — Telephone Encounter
Spoke with patient . See Phone note.

## 2019-04-13 ENCOUNTER — Inpatient Hospital Stay: Payer: BLUE CROSS/BLUE SHIELD | Attending: Pediatric Cardiology

## 2019-04-13 DIAGNOSIS — R42 Dizziness and giddiness: Secondary | ICD-10-CM

## 2019-04-24 ENCOUNTER — Telehealth: Payer: BLUE CROSS/BLUE SHIELD | Attending: Cardiovascular Disease

## 2019-04-24 ENCOUNTER — Ambulatory Visit: Payer: BLUE CROSS/BLUE SHIELD | Attending: Cardiovascular Disease

## 2019-04-24 DIAGNOSIS — Q204 Double inlet ventricle: Secondary | ICD-10-CM

## 2019-04-24 DIAGNOSIS — Z9889 Other specified postprocedural states: Secondary | ICD-10-CM

## 2019-04-24 DIAGNOSIS — Q201 Double outlet right ventricle: Secondary | ICD-10-CM

## 2019-04-24 NOTE — Progress Notes
Patient Consent to Telehealth Questionnaire   No flowsheet data found.  - I agree  to be treated via a video visit and acknowledge that I may be liable for any relevant copays or coinsurance depending on my insurance plan.  - I understand that this video visit is offered for my convenience and I am able to cancel and reschedule for an in-person appointment if I desire.  - I also acknowledge that sensitive medical information may be discussed during this video visit appointment and that it is my responsibility to locate myself in a location that ensures privacy to my own level of comfort.  - I also acknowledge that I should not be participating in a video visit in a way that could cause danger to myself or to those around me (such as driving or walking).  If my provider is concerned about my safety, I understand that they have the right to terminate the visit.     [facility]    Date of Visit: 04/24/2019   Reason for Visit: Routine follow up s/p cardiac catheterization on 08/08/17, in setting of DORV s/p Fontan conversion to extracardiac baffle on 11/21/07, and subsequent placement of epicardial permanent pacemaker.     HPI: Ann Duran is a 43 y.o. woman with the following cardiac history:  1. 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  2. Underwent modified Fontan procedure (patch connection of the IVC/SVC via the right atrium to MPA) at age 46 yrs at the [facility].  3. Postoperative chylothorax [phone], subsequently requiring exploration by a left thoracotomy, with ligation of lymphatic vessel and thoracic duct.  4. Suffered fatigue and exercise intolerance in November 2008, and was found to be in atrial fibrillation.  5. Underwent cardioversion at [facility] by Dr. Charlesetta Garibaldi on 10/14/07, along with cardiac cath which revealed excellent RA/Fontan pressures with a mean of 12-48mmHg. 6. Underwent Fontan conversion on 11/21/07, involving takedown of previous Fontan and placement of an extracardiac Fontan and bidirectional Glenn shunt and placement of epicardial permanent pacing leads. Discharged on 11/29/07  7. Re-admitted on 12/03/07 for thoracentesis of large right pleural effusion, in the setting of bradycardia and junctional rhythm  8. Underwent surgical placement of abdominal pacemaker generator on 12/05/07, but atrial lead was non-capturing, left with single site ventricular pacing.  9. Discharged on 12/12/07, after aggressive diuresis in the setting of significant ascites and perineal edema.  10. Subsequent diuresis over the next 6 weeks with resolution of ascites on moderate dose lasix and moderate dose aldactone.  11. underwent left hemidiaphragm plication by Dr. Larwance Sachs on 03/02/2009, unsuccessful attempt at placement of an atrial lead.  12. PPM rate set at VVI 90 bpm during the hospitalization, reduced to 60 bpm by Dr. Carollee Herter on 03/29/2009.  13. In the setting of poor chronotropic response to exercise, rate response activated by Dr. Carollee Herter in July 2011 with significantly improved exercise ability.  14. Underwent placement of Mirena IUD and D & C by Dr. Ardyth Man Parvatenini in February 2012. No cardiovascular complications.  Mirena IUD replaced with LEEP procedure in January 2018  15. 15. Good functional capacity with MVO2 of 24.8 (88% predicted) in August 2016.  Low normal single RV function, improves with exercise.  No exercise induced arrhythmias  16. 16. V-paced 99% of time at rate of 70, generator nearing ERI as of May 2018 remote check, requiring monthly checks to detect ERI.   17. 17. Liver US in Sept  2017 showed no lesions but METAVIR score was 4 consistent with cirrhosis.  18. 18. Underwent cardiac cath on 08/08/17 by Dr. Tobie Poet, showing mean Fontan pressures of , and liver biopsy showing extensive bridging fibrosis, but no cirrhosis.  Spirinolactone dose was doubled from 25mg  daily to 50mg  daily in an attempt to reduce Fontan volume pressure.  19. 19. Underwent pacemaker generator replacement ([employer]tr[employer]) on 12/12/17 by Dr. Larwance Sachs    Interval History: Ann Duran presents today for a telehealth visit. She was last seen on 05/13/18 and was doing well at that time. She was just about to start a new job and move to [place]. Ann Duran reports doing okay overall. With the pandemic, she is unable to travel currently so she is unable to come to [facility] for her appointment.     She has had some dizziness and a remote device check with EP. Now wearing a CAM monitor, but has recent improvement with her dizziness.     Denies any problems with chest pain, shortness of breath, palpitations, syncope or LE edema.      Her periods are suppressed by Mirena IUD.       Exercise: walking regularly and home exercises: HIIT and yoga    Dental:  Last visit was in January 2020, had a chipped tooth.     Past Medical History: As above.  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 Darlyn Chamber here at [facility].  LEEP in Jan 2018.  Mirena IUD replaced in Jan 2018.  Nose bleeds s/p bipolar cauterization 10/2016    Allergies: No Known Allergies     Medications:   ???  furosemide 80 mg tablet, Take 1 tablet (80 mg total) by mouth daily.  ???  pantoprazole 20 mg DR tablet, Take 1 tablet (20 mg total) by mouth daily as needed.  ???  potassium chloride 10 MEQ tablet, Take 2 tablets (20 mEq total) by mouth two (2) times daily.  ???  REVATIO 20 MG tablet, Take 1 tablet (20 mg total) by mouth three (3) times daily.  ???  sildenafil 20 mg tablet, TAKE 1 TABLET (20MG ) BY MOUTH THREE TIMES DAILY. GENERIC FOR REVATIO.CALL [phone] TO REFILL.  ???  spironolactone 50 mg tablet, Take 2 tablets (100 mg total) by mouth daily.  ???  alprazolam 0.5 mg tablet, Take 0.25 mg by mouth at bedtime as needed.  ???  Ascorbic Acid (VITAMIN C) 1000 MG tablet, Take 1,000 mg by mouth as needed for ???  aspirin (ASPIRIN) 81 mg EC tablet, Take 81 mg by mouth daily  ???  cyanocobalamin 1000 mcg tablet, Take 1,000 mcg by mouth as needed for   ???  fluticasone 50 mcg/act nasal spray, instill 1 spray into each nostril once daily  ???  ibuprofen (ADVIL) 200 mg tablet, Take 400 mg by mouth every six (6) hours as needed.  ???  LORazepam 0.5 mg tablet, Take 0.5 mg by mouth as needed for for Sleep.  ???  Omega-3 Fatty Acids (FISH OIL) 500 mg CAPS capsule, Take 500 mg by mouth as needed for.  ???  valacyclovir 1000 mg tablet, ValACYclovir HCl - 1 GM Oral Tablet  ???  valacyclovir 500 mg tablet, ValACYclovir HCl - 500 MG Oral Tablet  ???  zolpidem (AMBIEN) 5 mg tablet, Take 2.5 mg by mouth at bedtime as needed for Sleep.      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  Nonsmoker,  occas ETOH.    FH: Mother and father in their 20s in good health. Siblings, she has1 brother who is in good health. There is no history of congenital heart disease in the family.    ROS: Negative and noncontributory except as noted above in HPI and Interval Events.     Physical Exam:  General: Alert, NAD  Neuro: Oriented with appropriate mood and affect    Exam as of last visit on 05/13/18:  General: Pleasant thin woman, in NAD.  Skin: No rashes. Sternotomy and left thoracotomy scars well healed.  Head and Neck: Pupils are equal and reacting.   Mouth: Normal oral mucosa. Excellent dentition  Neck:Jugular venous pressure ~12cm  Chest:  Lungs clear bilaterally   Heart: Single S1 with no systolic murmurs or rubs. No diastolic murmurs or gallops.  Abdomen: Not distended, tympanic to percussion, nontender, liver not enlarged, no ascites noted.  Extremities: No edema, mild varicosity on inner thighs and lower right leg. Resolving bruise at right upper thigh  Neurologic: Alert and oriented x 4.  Psychologic: Normal mood and affect.      Imaging Data:  Echocardiogram:  05/13/18  PHYSICIAN INTERPRETATION: CONOTRUNCAL ANATOMY: The cardiac structural malformations are consistent with a diagnosis of levo-transposition of the great arteries.  LEFT VENTRICLE: Patient demonstrated normal sinus rhythm during echocardiogram. 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  RIGHT VENTRICLE: Double out right ventricle with hypoplastic LV and large VSD with '' functional single ventricle'' Right ventricular hypertrophy. Low normal systemic RV systolic function.  LEFT ATRIUM:  RIGHT ATRIUM: Right atrial pressure is estimated at 8 mmHg. Right atrial pressure is estimated at 8 mmHg. Large secundum ASD. Status post extracardiac Fontan. Visualized portions of the Fontan appear patent, no obvious fenestration noted. Sherrine Maples shunt   appears normal.  MITRAL VALVE: No evidence of mitral valve regurgitation.  TRICUSPID VALVE: Trace tricuspid valve regurgitation.  AORTIC VALVE: The aortic valve was not well visualized. Aortic valve area was not quantified by continuity equation on this study. Aorta is anterior of the pulmonic valve.  No evidence of aortic regurgitation.  PULMONIC VALVE: No indication of pulmonic valve regurgitation. Pulmonic is posterior of the aorta.  AORTA: Normal aortic arch, normal descending aorta, normal mid ascending aorta and the aortic root is normal in size and structure. No coarctation of the aorta.  PULMONARY ARTERY: The pulmonary artery is not well seen.  SYSTEMIC VEINS: The inferior vena cava is normal in size and exhibits less than 50% respiratory change.  PERICARDIUM: There is no evidence of pericardial effusion.  CONCLUSIONS:   1. L-transposition of the great arteries.   2. 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 Low normal systemic RV systolic function.   3. Double outlet right ventricle-doubly commited ventricular septal defect-aorta anterior to pulmonary artery-no left ventricular outflow tract obstruction. 4. 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   5. Large secundum ASD. Status post extracardiac Fontan. Visualized portions of the Fontan appear patent, no obvious fenestration noted. Sherrine Maples shunt appears normal.   6. A prior echo performed on 05/07/2017 was reviewed for comparison. No significant changes noted since the previous study.    Stress Echo/CPX:  05/13/18  BASELINE ECG:   Ventricular paced with biphasic T wave in lead V2  PROTOCOL: Bruce. (Treadmill advanced to stage 3 on its own during stage 2 of exercise after 1')  CONTROL:   HR:  72    BP: 110/64  O2 Sat:  98% (forehead) 92% (finger)     START:  1.7 MPH at 10% grade.   STOP: 11.4 METS with 3.4 mph at 14% grade x 2???37 after 7???37'' total exercise time due to shortness of breath.  Peak HR: 129      Peak BP: 128/66         Peak O2 Sat:  94%   Peak Double-Product: 16,512    Maximum VO2 = 1391 ml which is 77% of predicted, maximum VO2/kg = 23.7 ml/kg which is 85% of predicted (Note: The reference values are adjusted for weight and modality of exercise)  VE/VO2 =  40        VE/VCO2 = 36      VE/VCO2 slope = 30.1  VO2/HR =  11.1      which is 111% predicted      Breathing Reserve =    41              RQ= 1.12  Anaerobic Threshold was reached at HR = 103    VO2 =  1144 ml     VO2/kg =  19.5 ml/kg  RESULTS:   Symptoms:  Shortness of breath and leg fatigue, denied chest pain  ST-T Changes: Uninterruptable in the presence of ventricular pacing.  Dysrhythmias:  Rare PVCs during exercise  BP Response:  Blunted.  IMPRESSIONS: Fair exercise tolerance achieving 71% maximum predicted heart rate, limited by shortness of breath. ST changes during exercise are uninterpretable in the presence of ventricular pacing. No chest pain. Rare PVCs during exercise. Blunted BP at a low double product. Maximum VO2/kg = 23.7 ml/kg which is 85% of predicted. VE/VCO2 slope = 30.1 which is 119% of predicted. Compared with previous stress/CPX of 06/21/15 exercise tolerance has decreased from 12.7 METS, MVO2/kg had been 24.8, VE/VCO2 slope had been 23.1.  BASELINE:  Baseline: Single ventricle with low normal systolic function.     ADDITIONAL BASELINE FINDINGS:  LEFT VENTRICLE: Global left ventricular systolic function is mildly decreased (LVEF 40-49%).  MITRAL VALVE: Trace mitral valve regurgitation.  TRICUSPID VALVE: Trace tricuspid valve regurgitation.     Global left ventricular function increased appropriately with stress. No new segmental wall motion abnormalities were seen. Global left ventricular systolic function at peak stress is lower limits of normal (LVEF 50-55%). Mild mitral valve regurgitation.   Mild tricuspid regurgitation. Post-exercise: mild increase in single ventricle contractility.     SUMMARY:   1. Please see separately resulted cardiopulmonary exercise test for details of exercise performance.   2. DORV, L-TGA, hypoplastic LV, large VSD, large ASD.   3. Baseline: Single ventricle with low normal systolic function.   4. Post-exercise: mild increase in single ventricle contractility    Liver US:  05/13/18  IMPRESSION:  1. Irregular liver contour and coarsened in echotexture representing underlying chronic liver disease/fibrosis. No focal mass. Cholelithiasis. Splenomegaly supports portal hypertension. Normal liver Doppler.  2. Shear wave liver stiffness measurement indicating moderate to severe fibrosis (MetaVir stages F2-F3). Note, the morphologic appearance of the liver appears more fibrotic than Elastography measurements indicate.    Cardiac Cath:  08/08/17      Liver US  07/01/17 (outside report) shows no lesions, Metavir score of 4.  Echocardiogram:  05/07/17 PHYSICIAN INTERPRETATION:  CONOTRUNCAL ANATOMY: The cardiac structural malformations are consistent with a diagnosis of levo-transposition of the great arteries.  LEFT VENTRICLE: Visually estimated left ventricular ejection fraction 55-60%. Patient demonstrated normal sinus rhythm during echocardiogram. Hypoplastic LV with Large nonrestrictive muscular  VSD.  RIGHT VENTRICLE: Double out right ventricle with hypoplastic LV and large VSD with '' functional single ventricle'' Right ventricular hypertrophy. Low normal systemic RV systolic function.  RIGHT ATRIUM: Right atrial pressure is estimated at 8 mmHg. Right atrial pressure is estimated at 8 mmHg. Large secundum ASD. Status post extracardiac Fontan. Visualized portions of the Fontan appear patent, no obvious fenestration noted. Sherrine Maples shunt   appears normal.  MITRAL VALVE: No evidence of mitral valve regurgitation.  TRICUSPID VALVE: Mild tricuspid valve regurgitation.  AORTIC VALVE: The aortic valve was not well visualized. Aorta is anterior of the pulmonic valve.  No evidence of aortic regurgitation.  PULMONIC VALVE: No indication of pulmonic valve regurgitation. Pulmonic is posterior of the aorta.  AORTA: Normal aortic arch and normal descending aorta. The ascending aorta was not well visualized. Mildly enlarged aortic root (4.2 cm). No coarctation of the aorta.  PULMONARY ARTERY: The pulmonary artery is not well seen.  SYSTEMIC VEINS: The inferior vena cava is normal in size and exhibits less than 50% respiratory change.  PERICARDIUM: There is no evidence of pericardial effusion.  CONCLUSIONS:   1. L-transposition of the great arteries.   2. Double outlet right ventricle-doubly commited ventricular septal defect-aorta anterior to pulmonary artery-no left ventricular outflow tract obstruction.   3. 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 Low normal systemic RV systolic function.   4. Large secundum ASD. Status post extracardiac Fontan. Visualized portions of the Fontan appear patent, no obvious fenestration noted. Sherrine Maples shunt appears normal.   5. Left ventricular ejection fraction is approximately 55-60%. 6. Mildly enlarged aortic root (4.2 cm).   7. Mild tricuspid valve regurgitation.   8. A prior echo performed on 03/09/2016 was reviewed for comparison. No significant changes noted since the previous study.   9. 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     ECG 11/07/16: Ventricular paced. Ventricular rate of 75 bpn, QRS duration 184 ms. QT/QTc 498/556 ms    Liver Ultrasound 07/27/16 per report from OSH: Liver 17.5 cm in length. It is normal in echodensity. Liver contour is smooth. No focal lesion os identified. Hepatopedal flow is identifies in the main, right, and left portal veins. Gallbladder and biliary tree: There are several small mobile gallstones. There is no focal tenderness. The gallbladder wall is thickened measuring 0.7 cm. There is no pericholecystic fluid. No intra or extrahepatic biliary ductal dilatation.      Echocardiogram 03/09/16: CONOTRUNCAL ANATOMY: The cardiac structural malformations are consistent with a diagnosis of levo-transposition of the great arteries. The observed structural malformations are consistent with the diagnosis of double outlet right ventricle with doubly???commited ventricular septal defect. The aorta is anterior to the pulmonary artery. There is no sub-aortic obstruction of left ventricular outflow tract. LEFT VENTRICLE: Normal left ventricular size. Visually estimated left ventricular ejection fraction 55-60%. Small left ventricular size. RIGHT VENTRICLE: (DTI 6.0 cm/s). 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 Low normal systemic RV systolic function. LEFT ATRIUM: Mild left atrial enlargement. RIGHT ATRIUM: Right atrial pressure is estimated at 8 mmHg. Large secundum ASD. Status post extracardiac Fontan. Visualized portions of the Fontan appear patent, no obvious fenestration noted. Sherrine Maples shunt appears normal. MITRAL VALVE: No evidence of mitral valve regurgitation. TRICUSPID VALVE: Mild tricuspid valve regurgitation. AORTIC VALVE: The aortic valve is trileaflet and structurally normal, with normal leaflet excursion. Aorta is anterior of the  pulmonic valve. No evidence of aortic regurgitation. PULMONIC VALVE: No indication of pulmonic valve regurgitation. Pulmonic is posterior of the aorta. AORTA: Normal mid ascending aorta and normal aortic arch. Mildly enlarged aortic root (3.9 cm). SYSTEMIC VEINS: The inferior vena cava is normal in size and exhibits less than 50% respiratory change. PERICARDIUM: There is no evidence of pericardial effusion. ?????? CONCLUSIONS: ???1. 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 Low normal systemic RV systolic function. ???2. Left ventricular ejection fraction is approximately 55-60%. ???3. Double outlet right ventricle. ???4. Double outlet right ventricle ????????? -doubly commited ventricular septal defect ????????? -aorta anterior to pulmonary artery ????????? -no left ventricular outflow tract obstruction. ???5. L-transposition of the great arteries. ???6. Mild left atrial enlargement. ???7. Large secundum ASD. Status post extracardiac Fontan. Visualized portions of the Fontan appear patent, no obvious fenestration noted. Sherrine Maples shunt appears normal. ???8. Mild tricuspid valve regurgitation. ???9. Mildly enlarged aortic root (3.9 cm).    Abdominal US 06/21/15:  The pancreas is partially visualized and grossly unremarkable. The liver is mildly heterogeneous in echogenicity. There is no focal liver lesion. Portal vein demonstrates hepatopetal flow. No intra- or extrahepatic biliary dilation. The common bile duct is normal in caliber. The gallbladder is normally distended, containing gallstones. No gallbladder wall thickening or pericholecystic fluid. No sonographic Murphy's sign. The spleen is mildly enlarged. The kidneys are normal in size. Both kidneys demonstrate normal cortical thickness and echogenicity. No hydronephrosis. There is no ascites. The visualized proximal aorta and IVC are unremarkable. MEASUREMENTS: Liver: 15.1 cm Common Duct: 3 mm Right Kidney: 10.5 cm Left Kidney: 11.5 cm Spleen: 17 cm Aorta: 1.1 cm SHEAR WAVE LIVER STIFFNESS MEASUREMENTS: Mean 1.63 +/-0.12 m/sec, Median 1.66 m/sec, equating to 5.7-12 kPA. REFERENCE (m/s, kPa): Normal: 0.81-1.22 m/s 2.0-4.5 kPa (METAVIR F0) Normal/mild: 1.22-1.37 m/s 4.5-5.7 kPa (METAVIR F0-F1) Mild/moderate: 1.37-2 m/s 5.7-12.0 kPa (METAVIR F2-F3) Moderate/severe: 2-2.64 m/s 12.0-21.0 kPa (METAVIR F3-F4) Severe: >2.64 m/s >21.0 kPa (METAVIR F4) IMPRESSION: 1. Heterogenous liver echogenicity, suggesting diffuse liver disease. No suspicious liver lesions. Patent hepatic vasculature. 2. Shear wave liver stiffness measurement indicating mild/moderate liver fibrosis, MetaVir score of F2-3. 3. Splenomegaly. 4. Cholelithiasis without evidence of acute cholecystitis.    Echocardiogram 06/21/15:  2D AND M-MODE MEASUREMENTS (normal ranges within parentheses): Aorta/Left Atrium: Aortic Root, d (2D): 2.8 cm LV DIASTOLIC FUNCTION: MV Peak E: 1.61 m/s E/e' Ratio: 40.2 LV IVRT: 134 msec Decel Time: 229 msec Right Ventricle: TAPSE: 1.0 cm Aortic Valve: AoV Max Vel: 1.05 m/s AoV Peak PG: 4 mmHg AoV Mean PG: Tricuspid Valve and PA/RV Systolic Pressure: TR Max Velocity: 3.2 m/s RA Pressure: 8 mmHg RVSP/PASP: 49 mmHg Pulmonic Valve: PV Max Velocity: 0.8 m/s PV Max PG: 2 mmHg PV Mean PG: Aorta: Ao Asc: 2.4 cm. PHYSICIAN INTERPRETATION: CONOTRUNCAL ANATOMY: The cardiac structural malformations are consistent with a diagnosis of levo-transposition of the great arteries. 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 The aorta is anterior to the pulmonary artery. There is no sub-aortic obstruction of left ventricular outflow tract. LEFT VENTRICLE: Visually estimated left ventricular ejection fraction 55-60%. Small left ventricular size. RIGHT VENTRICLE: TAPSE 1.0 cm, (DTI 6.0 cm/s). 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 Low normal systemic RV systolic function. RIGHT ATRIUM: Right atrial pressure is estimated at 8 mmHg. Large secundum ASD. Status post  extracardiac Fontan. Visualized portions of the Fontan appear patent, no obvious fenestration noted. Unable to obtain images of Glenn shunt. MITRAL VALVE: No evidence of mitral valve regurgitation. TRICUSPID VALVE: Mild tricuspid valve regurgitation. Tricuspid regurgitation velocity is not well seen. AORTIC VALVE: The aortic valve is trileaflet and structurally normal, with normal leaflet excursion. Aorta is anterior of the pulmonic valve. No evidence of aortic regurgitation. PULMONIC VALVE: No indication of pulmonic valve regurgitation. Pulmonic is posterior of the aorta. AORTA: The aortic root is normal in size and structure, normal mid ascending aorta and normal aortic arch. SYSTEMIC VEINS: The inferior vena cava is not well visualized. PERICARDIUM: There is no evidence of pericardial effusion. CONCLUSIONS: 1. Small left ventricular size 2. 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 3. L-transposition of the great arteries. 4. Mild tricuspid valve regurgitation. 5. 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 Low normal systemic RV systolic function.     CPEX/Echo 06/11/15:  BASELINE: Low normal systemic RV systolic function at rest. Hypoplastic LV. ADDITIONAL BASELINE FINDINGS: MITRAL VALVE: Trace mitral valve regurgitation. TRICUSPID VALVE: Trace tricuspid valve regurgitation. No new segmental wall motion abnormalities were seen. Global left ventricular systolic function at peak stress is normal (LVEF 60-65%). Improved systemic RV systolic function with stress. No increase in degree of atrioventricular valve regurgitation with exercise. SUMMARY: 1. Improved systemic RV systolic function with stress. No increase in degree of atrioventricular valve regurgitation with exercise. 2. Low normal systemic RV systolic function at rest. Hypoplastic LV. BASELINE ECG: Ventricular paced rhythm PROTOCOL: Bruce. CONTROL: HR: 74 BP: 120/74 O2 Sat: 95 % START: 1.7 MPH at 10% grade. STOP: 12.7 METS with 4.2 mph at 16 % grade x 1???33'' after 10???33'' total exercise time due to shortness of breath. Peak HR: 141 Peak BP: 136/56 Peak O2 Sat: 95 % Peak Double-Product: 19,176 Maximum VO2 = 1386 ml which is 77 % of predicted, maximum VO2/kg = 24.8 ml/kg which is 88 % of predicted (Note: The reference values are adjusted for weight and modality of exercise) VE/VO2 = 35 VE/VCO2 = 31 VE/VCO2 slope = 23.1 VO2/HR = 9.8 which is 99 % predicted Breathing Reserve = 50 RQ= 1.13 Anaerobic Threshold was reached at HR = 103 VO2 = 1094 ml VO2/kg = 19.6 ml/kg RESULTS: Symptoms: Shortness of breath and leg fatigue; no chest pain or discomfort. ST-T Changes: Uninterpretable due to paced rhythm. Dysrhythmias: Rare PVC during exercise BP Response: Appropriate. IMPRESSIONS: Good exercise tolerance achieving 77 % maximum predicted heart rate, limited by shortness of breath. Exercise induced ST changes are uninterpretable due to paced rhythm. No exercise induced chest pain at a low double product. Rare PVCs as described above. Maximum VO2 = 1386 ml which is 77 % of predicted, maximum VO2/kg = 24.8 ml/kg which is 88 % of predicted . VE/VCO2 slope = 23.1 which is 92 % of predicted. Compared with previous stress/CPX of 04/24/13 maximum VO2/kg had been 25.0 at 11.9 METS Echocardiogram:  04/21/13: CONCLUSIONS:  1. Double outlet right ventricle.  2. Moderately enlarged right ventricular size and low normal systolic function. 3. Moderately increased RV wall thickness. 4. Mild tricuspid regurgitation. 5. Status post extracardiac Fontan, appears widely patent, no fenestration noted. Sherrine Maples shunt not well visualized.    LABS:    08/18/18: WBC 5.8 RBC 5.11 Hgb 15 Hct 44.5 Plt 89 NA 139 K 5.3 BUN 16  Creat 0.81 Gluc 71 BNP 149 TSH 2.39  08/08/17: Creat 0.72, BUN 12, K 3.8  07/01/17:  Chol 121, trig 58, HDL 40, LDL 69, Hgb A1c 5.4, INR 1.2, prealbumin 24, TSH 3.6, Hct 43.2, Plat 82k  07/26/16: WBC 3.0, HGB 4.69, HCT 40.7, PLT 75, NA 137, K 3.8, BUN 13, CREAT 0.7, ALK PHOS 57, AST 30, ALT 38, TBil 1.3, GGT 100, PREALB 24, BNP 113, INR 1.2, TSH 4.24, FT3 3.6, FT4 1.05.  04/01/13:  Hgb 14.7, Hct 42.4, MCV 85.1, plat ct 106k, Na 143, K 4.2, creat 0.8, BUN 19, AST 36, ALT 32    Impressions:  1. 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 Richardo Hanks, including bi-atrial Maze procedure and placement of a permanent atrial pacing lead.  2. Postoperative junctional rhythm and bradycardia: re-admitted 4 days after discharge with large right pericardial effusion requiring thoracentesis and placement of an abdominal pacemaker generator and epicardial ventricular lead on 12/05/07 (non-functioning atrial lead).  3. Ascites, improved on aldactone. No evidence of ascites on annual abdominal US.  4. Longstanding history of irregular and heavy menses, thus far unsuccessfully regulated on several types of hormonal therapy. Better controlled on Mirena since 2012.  Also has cervical HPV.  Now with CIN III, underwent LEEP procedure in Jan 2018 5. History of atrial fibrillation, but no post Fontan revision tachyarrhythmias thus far, formerly on coumadin (possible side effect of hair loss), now on ASA.  6. Longstanding elevated left hemidiapragm, s/p successful plication on 03/02/2009 by Dr. Larwance Sachs.  7. Liver US in 2014, 2016 and 2017, and 2018 shows no lesions, but 2017 and 2018 outside liver US report Metavir score of 4, which is consistent with cirrhosis.  2019 liver [facility] at [facility] showed MetaVir stage F2-F3, although elastography impacted by congestive hepatopathy.   8. Cardiac cath and liver biopsy on 08/08/17, with mean Fontan pressure under sedation of , and liver biopsy showing extensive bridging fibrosis but no cirrhosis.  Spirinolactone was increased from 25 to 50mg  to 100mg  after this cath  9. Ventricular pacemaker, pacing 99% of time.   Generator replaced on 12/12/17 by Dr. Larwance Sachs. Deferred placement of transvenous atrial lead via her LPA to minimize V pacing, based on Yasmin's wish not to be committed to anticoagulation  10. Stable exercise capacity in July 2019, with MVO2 23.7 (85% predicted), previously 24.8 and 88% predicted in 2016    [facility]    Labs (BMP, BNP, LFTs, GGT, AFP, INR, CBC, Prealbumin):  []  Within last year (date: )  []  Ordered today  [x]  Deferred: Now living in [place]    Hep C screening  [x]  Prior HCV Ab negative (date: 06/21/15)  []  Not indicated (no surgery prior to early 1990s)  []  Ordered    Last hemodynamic cath:   [x]  Within last 10 years (date: 08/08/17), see above results (prior to Fontan revision)  []  Deferred. Reason:   []  Scheduled    Last Liver biopsy  [x]  Within last 10 years (date:08/08/17), see above results  []  Scheduled with cath    Liver Imaging:  []  MRI Abdomen within last 5 years (date: ), see above  []  Triple phase CT within last 5 years (date: ), see above  [x]  Liver ultrasound completed (date: 05/13/18), see  above  []  Ordered today, see above.     Advanced cardiac imaging: []  Cardiac MRI (date: ), see above  [x]  Cardiac CT (date: 12/12/2007), see above  [x]  Deferred. Reason: Pacemaker  []  Ordered today, see above     PDE-5 Inhibitor recommended:  [x]  On sildenafil []  On tadalafil  []  Not tolerated. Previously on []  sildenafil (Year: )  []  tadalafil (Year: )   Side effect:  []  Deferred. Reason:   []  Ordered today, see above    Echo  [x]  Within last year (date: 05/13/18), see above results  []  Deferred. Reason:   []  Ordered today    Cardiopulmonary exercise test  [x]  Within last 3 years (date: 05/13/18), see above results  []  Deferred. Reason:   []  Ordered today    Advanced care planning  [x]  Date: Advanced directive in CC from 12/27/2010  []  Deferred. Reason:       Recommendations:   1.  Stable from a cardiac perspective. Will finish wearing the CAM monitor (although dizziness has now improved) and then follow up with Dr. Carollee Herter.   2. Continue ASA 81mg ,  furosemide 80 daily, spirinolactone 100mg ,  and sildenafil 20mg  TID     3. Continue regular exercise as tolerated, as well as diaphragmatic conditioning exercises with inspirometer.    4. Continue regular pacemaker surveillance with Dr. Carollee Herter. She is aware of the issues with her current pacing system (single site pacing) and we have previously discussed the option of using transvenous leads in her LPA, with screw in lead to roof of atrium, but this would require anticoagulation longterm.   She was reluctant to consider this, since she bruises readily with ASA.  She understands that ultimately this will be required to prevent degeneration of ventricular function from single site pacing.     5. Resume regular dental care as able (when COVID pandemic subsides)  6.  Follow up in person with repeat echocardiogram, liver US and labs when back in [place].     Patient was seen and discussed with [facility] attending, Dr. Dyanne Iha.    Jed Limerick, NP  [facility] Nurse Practitioner    I have seen and examined the patient and I agree with the findings, assessment, and recommendations made above which we developed together and communicated with the patient.  We will follow up on CAM monitor results.  I have also advised her to hold revatio on days when she feels dizzy and stay well hydrated in the hot and humid [place] summer.  She has an upcoming appointment in September at [facility] in [place] with Dr. Yolanda Bonine, I have encouraged her to keep that appointment and we can send her records to Dr. Dewayne Hatch prior to her visit.  I have discouraged Mckenzy from flying at this point given the ongoing COVID pandemic.        CC:  Dr. Elmer Picker

## 2019-04-27 ENCOUNTER — Telehealth: Payer: BLUE CROSS/BLUE SHIELD

## 2019-04-27 NOTE — Telephone Encounter
Spoke to patient regarding holter wear concern. Pt informed to wear monitor for ''14 days or until symptoms'' per order. Pt stated understanding.

## 2019-04-30 ENCOUNTER — Telehealth: Payer: BLUE CROSS/BLUE SHIELD

## 2019-04-30 NOTE — Telephone Encounter
[facility] records faxed to Dr. Mendelson/Attn: Randell Patient on June 10.  Echo, cath radiology imaging sent out [employer] on June 25.  [employer] tracking number [id number].

## 2019-05-20 ENCOUNTER — Telehealth: Payer: BLUE CROSS/BLUE SHIELD | Attending: Pediatric Cardiology

## 2019-05-20 NOTE — Progress Notes
Congenital EP Telemedicine Note  Patient Consent to Telehealth Questionnaire   [facility] TELEHEALTH PRECHECKIN QUESTIONS 05/20/2019   By clicking ''I Agree'', I consent to the below:  I Agree     - I agree  to be treated via a video visit and acknowledge that I may be liable for any relevant copays or coinsurance depending on my insurance plan.  - I understand that this video visit is offered for my convenience and I am able to cancel and reschedule for an in-person appointment if I desire.  - I also acknowledge that sensitive medical information may be discussed during this video visit appointment and that it is my responsibility to locate myself in a location that ensures privacy to my own level of comfort.  - I also acknowledge that I should not be participating in a video visit in a way that could cause danger to myself or to those around me (such as driving or walking).  If my provider is concerned about my safety, I understand that they have the right to terminate the visit.       PATIENT: [place]  MRN: [id number]  DOB: 14-Nov-1975  DATE OF SERVICE: 05/20/2019    PRIMARY CARE PROVIDER: Lilla Shook, DO    SPECIALTY: Congenital Electrophysiology    REASON FOR VISIT: Follow up of ambulatory monitor result.    Subjective:   Ann [place]an is a 43 y.o. female who uderwent remote pacemaker evaluation in June and then had a 7 day ambulatory monitor due to symptoms of dizziness and chest discomfort. Her cardiac history is remarkable foir a diagnosis of L-transposition/ single ventricle. Her first cardiac surgery was a Modified RA to pA Fonatn procedure at age 22. She required a thoracic duct ligation after her Fontan and then reportedly did well until age 22 when she presented with a complaint of fatigue and was diagnosed with atrial arrhythmias. She then underwent a Fontan conversion in Jan 2009 at which time pacemaker leads were placed but the device was placed 2 weeks later after she developed bradycardia and fluid retention. The atrial lead was non functional and so she was VVI paced. She underwent attempted atrial lead revision with diaphragm plication in April of 2010, but the new atrial lead also failed to function. She was tried on VVI backup pacing but was eventually changed to VVIR at 70 bpm lower rate, which she has tolerated well since 2011, but has had increasing requirement for ventricular pacing.    Interval Events:  [place] has been living in [place] for the last year. She states that for the last weeks to months she has had progressive problems with insomnia, poor appetite, intermittent nausea and episodes of dizzines and chest disconfort. The dizziness happens randomly, but not during exertion, lasts a few second and resolves. The chest is comfort is also brief and only at rest. She cannot describe the exact sensation, but would not call it pain. She has not noticed any changes in her exercise capacity.  She denies any recent palpitations, syncope, or shortness of breath.  On her remote pacemaker check from June, she is 99 % V paced, with normal VVI device function.    Past Medical History:  Past Medical History:   Diagnosis Date   ??? Anxiety     controlled per pt   ??? Cholelithiases     Noted on ultrasound, asymptomatic    ??? Delayed emergence from general anesthesia 2010    pacemaker insertion, per  patient   ??? DORV (double outlet right ventricle)    ??? GERD (gastroesophageal reflux disease)     well controlled w/med and diet   ??? History of blood transfusion    ??? HPV (human papilloma virus) infection    ??? IUD (intrauterine device) in place     Mirena placed 12/27/10, replaced 12/2015, replaced November 16, 2016   ??? 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  Negative tobacco use.  Rare EtOH.    Review of Systems:  14 point review of system negative other than as stated above    Medications: Outpatient Medications Prior to Visit   Medication Sig Dispense Refill   ??? alprazolam 0.5 mg tablet Take 0.25 mg by mouth at bedtime as needed.     ??? Ascorbic Acid (VITAMIN C) 1000 MG tablet Take 1,000 mg by mouth as needed for .     ??? aspirin (ASPIRIN) 81 mg EC tablet Take 81 mg by mouth daily     ??? cyanocobalamin 1000 mcg tablet Take 1,000 mcg by mouth as needed for .     ??? fluticasone 50 mcg/act nasal spray instill 1 spray into each nostril once daily  0   ??? furosemide 80 mg tablet Take 1 tablet (80 mg total) by mouth daily. 90 tablet 3   ??? ibuprofen (ADVIL) 200 mg tablet Take 400 mg by mouth every six (6) hours as needed.     ??? LORazepam 0.5 mg tablet Take 0.5 mg by mouth as needed for for Sleep.     ??? Omega-3 Fatty Acids (FISH OIL) 500 mg CAPS capsule Take 500 mg by mouth as needed for.     ??? pantoprazole 20 mg DR tablet Take 1 tablet (20 mg total) by mouth daily as needed. 90 tablet 3   ??? potassium chloride 10 MEQ tablet Take 2 tablets (20 mEq total) by mouth two (2) times daily. 360 tablet 3   ??? REVATIO 20 MG tablet Take 1 tablet (20 mg total) by mouth three (3) times daily. 270 tablet 3   ??? sildenafil 20 mg tablet TAKE 1 TABLET (20MG ) BY MOUTH THREE TIMES DAILY. GENERIC FOR REVATIO.CALL [phone] TO REFILL. 270 tablet 3   ??? spironolactone 50 mg tablet Take 2 tablets (100 mg total) by mouth daily. 180 tablet 3   ??? valacyclovir 1000 mg tablet ValACYclovir HCl - 1 GM Oral Tablet     ??? valacyclovir 500 mg tablet ValACYclovir HCl - 500 MG Oral Tablet     ??? zolpidem (AMBIEN) 5 mg tablet Take 2.5 mg by mouth at bedtime as needed for Sleep.       No facility-administered medications prior to visit.      Allergies:  No Known Allergies      Objective:     CAM Monitor  ??? Predominant rhythm: Paced  ??? Paced rhythm  ??? Ventricular Ectopy = (<1%)  1802 isolated, unifocal PVCs and 208 pairs  ??? Atrial Ectopy = (<1%)  1 isolated PAC  Predominantly V paced rhythm with rare supraventricular beats (most are labeled PVC's). Symptoms of ''Chest discomfort'  correlated with 2 narrow complex beats/. Symptoms of  Dizziness/Lightheadedness correlated with ventricular pacing.  One of four button presses correlated with a narrow complex  beat, the remaining button presses correlated with ventricular  pacing.  Jaryn Rosko    Pacemaker:  R wave: 13.4 mV  Impedance:  V: 551 ohms???  Capture threshold  V: 2.5 V @ 0.8 ms  ???  Tachy episode summary since 01/11/2019:  VT/VF: 0  ???  AP: 0%  VP: 99.7%    Settings:   Mode: VVIR  Rate: Lower/Max Track: 70/ 150 bpm  RV Output: 3.5 V @ 0.52 ms  RV Sensitivity: 2.80 mV    Assessment:   43 y.o. female with DORV status post Fontan conversion to extracardiac baffle on 11/21/2007, with subsequent placement of an epicardial pacemaker for sinus node dysfunction.  She had atrial lead failure shortly after initial placement.  In 2010 she underwent an unsuccessful attempt at placement of a new atrial lead.    Todays video visit is for routine follow up and to discuss recult of Ambulatory monitor from June. Her symptoms did not correlate with any rhythm abnormality bu she does appear to be aware/ symptomatic of her intrinsic, conducted atrial beats. These only account for 0.3 % of all her beats but were present on 2 of 7 patient events. I explained the findings to the patient who felt that there was no need to try to eliminate these events, now that she knows they are no dangerous.     Plan/ Recommendation:     Remote monitoring every three months and return to clinic in one year.    Vania Rea. Carollee [place]ter M.D.  Pediatric Cardiology

## 2019-05-21 NOTE — Progress Notes
Patient Consent to Telehealth Questionnaire   [facility] TELEHEALTH PRECHECKIN QUESTIONS 05/20/2019   By clicking ''I Agree'', I consent to the below:  I Agree     - I agree  to be treated via a video visit and acknowledge that I may be liable for any relevant copays or coinsurance depending on my insurance plan.  - I understand that this video visit is offered for my convenience and I am able to cancel and reschedule for an in-person appointment if I desire.  - I also acknowledge that sensitive medical information may be discussed during this video visit appointment and that it is my responsibility to locate myself in a location that ensures privacy to my own level of comfort.  - I also acknowledge that I should not be participating in a video visit in a way that could cause danger to myself or to those around me (such as driving or walking).  If my provider is concerned about my safety, I understand that they have the right to terminate the visit.     [facility]    Date of Visit: 05/22/2019  Reason for Visit: Routine follow up s/p cardiac catheterization on 08/08/17, in setting of DORV s/p Fontan conversion to extracardiac baffle on 11/21/07, and subsequent placement of epicardial permanent pacemaker.     HPI: Ann Duran is a 43 y.o. woman with the following cardiac history:  1. 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  2. Underwent modified Fontan procedure (patch connection of the IVC/SVC via the right atrium to MPA) at age 83 yrs at the [facility].  3. Postoperative chylothorax [phone], subsequently requiring exploration by a left thoracotomy, with ligation of lymphatic vessel and thoracic duct.  4. Suffered fatigue and exercise intolerance in November 2008, and was found to be in atrial fibrillation.  5. Underwent cardioversion at [facility] by Dr. Charlesetta Garibaldi on 10/14/07, along with cardiac cath which revealed excellent RA/Fontan pressures with a mean of 12-108mmHg.  6. Underwent Fontan conversion on 11/21/07, involving takedown of previous Fontan and placement of an extracardiac Fontan and bidirectional Glenn shunt and placement of epicardial permanent pacing leads. Discharged on 11/29/07  7. Re-admitted on 12/03/07 for thoracentesis of large right pleural effusion, in the setting of bradycardia and junctional rhythm  8. Underwent surgical placement of abdominal pacemaker generator on 12/05/07, but atrial lead was non-capturing, left with single site ventricular pacing.  9. Discharged on 12/12/07, after aggressive diuresis in the setting of significant ascites and perineal edema.  10. Subsequent diuresis over the next 6 weeks with resolution of ascites on moderate dose lasix and moderate dose aldactone.  11. underwent left hemidiaphragm plication by Dr. Larwance Sachs on 03/02/2009, unsuccessful attempt at placement of an atrial lead.  12. PPM rate set at VVI 90 bpm during the hospitalization, reduced to 60 bpm by Dr. Carollee Herter on 03/29/2009.  13. In the setting of poor chronotropic response to exercise, rate response activated by Dr. Carollee Herter in July 2011 with significantly improved exercise ability.  14. Underwent placement of Mirena IUD and D & C by Dr. Ardyth Man Parvatenini in February 2012. No cardiovascular complications.  Mirena IUD replaced with LEEP procedure in January 2018  15. 15. Good functional capacity with MVO2 of 24.8 (88% predicted) in August 2016.  Low normal single RV function, improves with exercise.  No exercise induced arrhythmias  16. 16. V-paced 99% of time at rate of 70, generator nearing ERI as of  May 2018 remote check, requiring monthly checks to detect ERI.   17. 17. Liver US in Sept 2017 showed no lesions but METAVIR score was 4 consistent with cirrhosis.  18. 18. Underwent cardiac cath on 08/08/17 by Dr. Tobie Poet, showing mean Fontan pressures of , and liver biopsy showing extensive bridging fibrosis, but no cirrhosis.  Spirinolactone dose was doubled from 25mg  daily to 50mg  daily in an attempt to reduce Fontan volume pressure.  19. 19. Underwent pacemaker generator replacement ([employer]tr[employer]) on 12/12/17 by Dr. Larwance Sachs    Interval History: Ann Duran presents today for a telehealth visit. She is having trouble with the humidity in [place] and so her exercise tolerance is decreased because of that.  She is also having problems with insomnia.  She does not report any change in abdominal girth, no issues with dyspnea on exertion, occasionally her feet are throbbing.      Past Medical History: As above.  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 Darlyn Chamber here at [facility].  LEEP in Jan 2018.  Mirena IUD replaced in Jan 2018.  Nose bleeds s/p bipolar cauterization 10/2016    Allergies: No Known Allergies     Medications:   ???  furosemide 80 mg tablet, Take 1 tablet (80 mg total) by mouth daily.  ???  pantoprazole 20 mg DR tablet, Take 1 tablet (20 mg total) by mouth daily as needed.  ???  potassium chloride 10 MEQ tablet, Take 2 tablets (20 mEq total) by mouth two (2) times daily.  ???  REVATIO 20 MG tablet, Take 1 tablet (20 mg total) by mouth three (3) times daily.  ???  sildenafil 20 mg tablet, TAKE 1 TABLET (20MG ) BY MOUTH THREE TIMES DAILY. GENERIC FOR REVATIO.CALL [phone] TO REFILL.  ???  spironolactone 50 mg tablet, Take 2 tablets (100 mg total) by mouth daily.  ???  alprazolam 0.5 mg tablet, Take 0.25 mg by mouth at bedtime as needed.  ???  Ascorbic Acid (VITAMIN C) 1000 MG tablet, Take 1,000 mg by mouth as needed for  ???  aspirin (ASPIRIN) 81 mg EC tablet, Take 81 mg by mouth daily  ???  cyanocobalamin 1000 mcg tablet, Take 1,000 mcg by mouth as needed for   ???  fluticasone 50 mcg/act nasal spray, instill 1 spray into each nostril once daily ???  ibuprofen (ADVIL) 200 mg tablet, Take 400 mg by mouth every six (6) hours as needed.  ???  LORazepam 0.5 mg tablet, Take 0.5 mg by mouth as needed for for Sleep.  ???  Omega-3 Fatty Acids (FISH OIL) 500 mg CAPS capsule, Take 500 mg by mouth as needed for.  ???  valacyclovir 1000 mg tablet, ValACYclovir HCl - 1 GM Oral Tablet  ???  valacyclovir 500 mg tablet, ValACYclovir HCl - 500 MG Oral Tablet  ???  zolpidem (AMBIEN) 5 mg tablet, Take 2.5 mg by mouth at bedtime as needed for Sleep.      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  Nonsmoker, occas ETOH.    FH: Mother and father in their 79s in good health. Siblings, she has1 brother who is in good health. There is no history of congenital heart disease in the family.    ROS: Negative and noncontributory except as noted above in HPI and Interval Events.     Physical Exam:  General: Alert, NAD  Neuro: Oriented with appropriate mood and affect  Exam as of last visit on 05/13/18:  General: Pleasant thin woman, in NAD.  Skin: No rashes. Sternotomy and left thoracotomy scars well healed.  Head and Neck: Pupils are equal and reacting.   Mouth: Normal oral mucosa. Excellent dentition  Neck:Jugular venous pressure ~12cm  Chest:  Lungs clear bilaterally   Heart: Single S1 with no systolic murmurs or rubs. No diastolic murmurs or gallops.  Abdomen: Not distended, tympanic to percussion, nontender, liver not enlarged, no ascites noted.  Extremities: No edema, mild varicosity on inner thighs and lower right leg. Resolving bruise at right upper thigh  Neurologic: Alert and oriented x 4.  Psychologic: Normal mood and affect.      Cardiac Diagnostic Data:  CAM  Monitor 04/12/17:  Predominant rhythm: Paced  ??? Paced rhythm  ??? Ventricular Ectopy = (<1%)  1802 isolated, unifocal PVCs and 208 pairs  ??? Atrial Ectopy = (<1%)  1 isolated PAC  Predominantly V paced rhythm with rare supraventricular beats  (most are labeled PVC's). Symptoms of ''Chest discomfort' correlated with 2 narrow complex beats/. Symptoms of  Dizziness/Lightheadedness correlated with ventricular pacing.  One of four button presses correlated with a narrow complex  beat, the remaining button presses correlated with ventricular  pacing.      Echocardiogram:  05/13/18  PHYSICIAN INTERPRETATION:  CONOTRUNCAL ANATOMY: The cardiac structural malformations are consistent with a diagnosis of levo-transposition of the great arteries.  LEFT VENTRICLE: Patient demonstrated normal sinus rhythm during echocardiogram. 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  RIGHT VENTRICLE: Double out right ventricle with hypoplastic LV and large VSD with '' functional single ventricle'' Right ventricular hypertrophy. Low normal systemic RV systolic function.  LEFT ATRIUM:  RIGHT ATRIUM: Right atrial pressure is estimated at 8 mmHg. Right atrial pressure is estimated at 8 mmHg. Large secundum ASD. Status post extracardiac Fontan. Visualized portions of the Fontan appear patent, no obvious fenestration noted. Sherrine Maples shunt   appears normal.  MITRAL VALVE: No evidence of mitral valve regurgitation.  TRICUSPID VALVE: Trace tricuspid valve regurgitation.  AORTIC VALVE: The aortic valve was not well visualized. Aortic valve area was not quantified by continuity equation on this study. Aorta is anterior of the pulmonic valve.  No evidence of aortic regurgitation.  PULMONIC VALVE: No indication of pulmonic valve regurgitation. Pulmonic is posterior of the aorta.  AORTA: Normal aortic arch, normal descending aorta, normal mid ascending aorta and the aortic root is normal in size and structure. No coarctation of the aorta.  PULMONARY ARTERY: The pulmonary artery is not well seen.  SYSTEMIC VEINS: The inferior vena cava is normal in size and exhibits less than 50% respiratory change.  PERICARDIUM: There is no evidence of pericardial effusion.  CONCLUSIONS:   1. L-transposition of the great arteries. 2. 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 Low normal systemic RV systolic function.   3. Double outlet right ventricle-doubly commited ventricular septal defect-aorta anterior to pulmonary artery-no left ventricular outflow tract obstruction.   4. 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   5. Large secundum ASD. Status post extracardiac Fontan. Visualized portions of the Fontan appear patent, no obvious fenestration noted. Sherrine Maples shunt appears normal.   6. A prior echo performed on 05/07/2017 was reviewed for comparison. No significant changes noted since the previous study.    Stress Echo/CPX:  05/13/18  BASELINE ECG:   Ventricular paced with biphasic T wave in lead V2  PROTOCOL: Bruce. (Treadmill advanced  to stage 3 on its own during stage 2 of exercise after 1')  CONTROL:   HR:  72    BP: 110/64     O2 Sat:  98% (forehead) 92% (finger)     START:  1.7 MPH at 10% grade.   STOP: 11.4 METS with 3.4 mph at 14% grade x 2???37 after 7???37'' total exercise time due to shortness of breath.  Peak HR: 129      Peak BP: 128/66         Peak O2 Sat:  94%   Peak Double-Product: 16,512    Maximum VO2 = 1391 ml which is 77% of predicted, maximum VO2/kg = 23.7 ml/kg which is 85% of predicted (Note: The reference values are adjusted for weight and modality of exercise)  VE/VO2 =  40        VE/VCO2 = 36      VE/VCO2 slope = 30.1  VO2/HR =  11.1      which is 111% predicted      Breathing Reserve =    41              RQ= 1.12  Anaerobic Threshold was reached at HR = 103    VO2 =  1144 ml     VO2/kg =  19.5 ml/kg  RESULTS:   Symptoms:  Shortness of breath and leg fatigue, denied chest pain  ST-T Changes: Uninterruptable in the presence of ventricular pacing.  Dysrhythmias:  Rare PVCs during exercise  BP Response:  Blunted.  IMPRESSIONS: Fair exercise tolerance achieving 71% maximum predicted heart rate, limited by shortness of breath. ST changes during exercise are uninterpretable in the presence of ventricular pacing. No chest pain. Rare PVCs during exercise. Blunted BP at a low double product. Maximum VO2/kg = 23.7 ml/kg which is 85% of predicted. VE/VCO2 slope = 30.1 which is 119% of predicted. Compared with previous stress/CPX of 06/21/15 exercise tolerance has decreased from 12.7 METS, MVO2/kg had been 24.8, VE/VCO2 slope had been 23.1.  BASELINE:  Baseline: Single ventricle with low normal systolic function.     ADDITIONAL BASELINE FINDINGS:  LEFT VENTRICLE: Global left ventricular systolic function is mildly decreased (LVEF 40-49%).  MITRAL VALVE: Trace mitral valve regurgitation.  TRICUSPID VALVE: Trace tricuspid valve regurgitation.     Global left ventricular function increased appropriately with stress. No new segmental wall motion abnormalities were seen. Global left ventricular systolic function at peak stress is lower limits of normal (LVEF 50-55%). Mild mitral valve regurgitation.   Mild tricuspid regurgitation. Post-exercise: mild increase in single ventricle contractility.     SUMMARY:   1. Please see separately resulted cardiopulmonary exercise test for details of exercise performance.   2. DORV, L-TGA, hypoplastic LV, large VSD, large ASD.   3. Baseline: Single ventricle with low normal systolic function.   4. Post-exercise: mild increase in single ventricle contractility    Liver US:  05/13/18  IMPRESSION:  1. Irregular liver contour and coarsened in echotexture representing underlying chronic liver disease/fibrosis. No focal mass. Cholelithiasis. Splenomegaly supports portal hypertension. Normal liver Doppler.  2. Shear wave liver stiffness measurement indicating moderate to severe fibrosis (MetaVir stages F2-F3). Note, the morphologic appearance of the liver appears more fibrotic than Elastography measurements indicate.    Cardiac Cath:  08/08/17: Liver US  07/01/17 (outside report) shows no lesions, Metavir score of 4.    Echocardiogram:  05/07/17 PHYSICIAN INTERPRETATION:  CONOTRUNCAL ANATOMY: The cardiac structural malformations are consistent with a diagnosis of levo-transposition of  the great arteries.  LEFT VENTRICLE: Visually estimated left ventricular ejection fraction 55-60%. Patient demonstrated normal sinus rhythm during echocardiogram. 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  RIGHT VENTRICLE: Double out right ventricle with hypoplastic LV and large VSD with '' functional single ventricle'' Right ventricular hypertrophy. Low normal systemic RV systolic function.  RIGHT ATRIUM: Right atrial pressure is estimated at 8 mmHg. Right atrial pressure is estimated at 8 mmHg. Large secundum ASD. Status post extracardiac Fontan. Visualized portions of the Fontan appear patent, no obvious fenestration noted. Sherrine Maples shunt   appears normal.  MITRAL VALVE: No evidence of mitral valve regurgitation.  TRICUSPID VALVE: Mild tricuspid valve regurgitation.  AORTIC VALVE: The aortic valve was not well visualized. Aorta is anterior of the pulmonic valve.  No evidence of aortic regurgitation.  PULMONIC VALVE: No indication of pulmonic valve regurgitation. Pulmonic is posterior of the aorta.  AORTA: Normal aortic arch and normal descending aorta. The ascending aorta was not well visualized. Mildly enlarged aortic root (4.2 cm). No coarctation of the aorta.  PULMONARY ARTERY: The pulmonary artery is not well seen.  SYSTEMIC VEINS: The inferior vena cava is normal in size and exhibits less than 50% respiratory change.  PERICARDIUM: There is no evidence of pericardial effusion.  CONCLUSIONS:   1. L-transposition of the great arteries.   2. Double outlet right ventricle-doubly commited ventricular septal defect-aorta anterior to pulmonary artery-no left ventricular outflow tract obstruction.   3. 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 Low normal systemic RV systolic function.   4. Large secundum ASD. Status post extracardiac Fontan. Visualized portions of the Fontan appear patent, no obvious fenestration noted. Sherrine Maples shunt appears normal.   5. Left ventricular ejection fraction is approximately 55-60%.   6. Mildly enlarged aortic root (4.2 cm).   7. Mild tricuspid valve regurgitation.   8. A prior echo performed on 03/09/2016 was reviewed for comparison. No significant changes noted since the previous study.   9. 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     ECG 11/07/16: Ventricular paced. Ventricular rate of 75 bpn, QRS duration 184 ms. QT/QTc 498/556 ms    Liver Ultrasound 07/27/16 per report from OSH: Liver 17.5 cm in length. It is normal in echodensity. Liver contour is smooth. No focal lesion os identified. Hepatopedal flow is identifies in the main, right, and left portal veins. Gallbladder and biliary tree: There are several small mobile gallstones. There is no focal tenderness. The gallbladder wall is thickened measuring 0.7 cm. There is no pericholecystic fluid. No intra or extrahepatic biliary ductal dilatation.      Echocardiogram 03/09/16: CONOTRUNCAL ANATOMY: The cardiac structural malformations are consistent with a diagnosis of levo-transposition of the great arteries. The observed structural malformations are consistent with the diagnosis of double outlet right ventricle with doubly???commited ventricular septal defect. The aorta is anterior to the pulmonary artery. There is no sub-aortic obstruction of left ventricular outflow tract. LEFT VENTRICLE: Normal left ventricular size. Visually estimated left ventricular ejection fraction 55-60%. Small left ventricular size. RIGHT VENTRICLE: (DTI 6.0 cm/s). 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 Low normal systemic RV systolic function. LEFT ATRIUM: Mild left atrial enlargement. RIGHT ATRIUM: Right atrial pressure is estimated at 8 mmHg. Large secundum ASD. Status post extracardiac Fontan. Visualized portions of the Fontan appear patent, no obvious fenestration noted. Sherrine Maples shunt appears normal. MITRAL VALVE: No evidence of  mitral valve regurgitation. TRICUSPID VALVE: Mild tricuspid valve regurgitation. AORTIC VALVE: The aortic valve is trileaflet and structurally normal, with normal leaflet excursion. Aorta is anterior of the pulmonic valve. No evidence of aortic regurgitation. PULMONIC VALVE: No indication of pulmonic valve regurgitation. Pulmonic is posterior of the aorta. AORTA: Normal mid ascending aorta and normal aortic arch. Mildly enlarged aortic root (3.9 cm). SYSTEMIC VEINS: The inferior vena cava is normal in size and exhibits less than 50% respiratory change. PERICARDIUM: There is no evidence of pericardial effusion. ?????? CONCLUSIONS: ???1. 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 Low normal systemic RV systolic function. ???2. Left ventricular ejection fraction is approximately 55-60%. ???3. Double outlet right ventricle. ???4. Double outlet right ventricle ????????? -doubly commited ventricular septal defect ????????? -aorta anterior to pulmonary artery ????????? -no left ventricular outflow tract obstruction. ???5. L-transposition of the great arteries. ???6. Mild left atrial enlargement. ???7. Large secundum ASD. Status post extracardiac Fontan. Visualized portions of the Fontan appear patent, no obvious fenestration noted. Sherrine Maples shunt appears normal. ???8. Mild tricuspid valve regurgitation. ???9. Mildly enlarged aortic root (3.9 cm).    Abdominal US 06/21/15:  The pancreas is partially visualized and grossly unremarkable. The liver is mildly heterogeneous in echogenicity. There is no focal liver lesion. Portal vein demonstrates hepatopetal flow. No intra- or extrahepatic biliary dilation. The common bile duct is normal in caliber. The gallbladder is normally distended, containing gallstones. No gallbladder wall thickening or pericholecystic fluid. No sonographic Murphy's sign. The spleen is mildly enlarged. The kidneys are normal in size. Both kidneys demonstrate normal cortical thickness and echogenicity. No hydronephrosis. There is no ascites. The visualized proximal aorta and IVC are unremarkable. MEASUREMENTS: Liver: 15.1 cm Common Duct: 3 mm Right Kidney: 10.5 cm Left Kidney: 11.5 cm Spleen: 17 cm Aorta: 1.1 cm SHEAR WAVE LIVER STIFFNESS MEASUREMENTS: Mean 1.63 +/-0.12 m/sec, Median 1.66 m/sec, equating to 5.7-12 kPA. REFERENCE (m/s, kPa): Normal: 0.81-1.22 m/s 2.0-4.5 kPa (METAVIR F0) Normal/mild: 1.22-1.37 m/s 4.5-5.7 kPa (METAVIR F0-F1) Mild/moderate: 1.37-2 m/s 5.7-12.0 kPa (METAVIR F2-F3) Moderate/severe: 2-2.64 m/s 12.0-21.0 kPa (METAVIR F3-F4) Severe: >2.64 m/s >21.0 kPa (METAVIR F4) IMPRESSION: 1. Heterogenous liver echogenicity, suggesting diffuse liver disease. No suspicious liver lesions. Patent hepatic vasculature. 2. Shear wave liver stiffness measurement indicating mild/moderate liver fibrosis, MetaVir score of F2-3. 3. Splenomegaly. 4. Cholelithiasis without evidence of acute cholecystitis.    Echocardiogram 06/21/15:  2D AND M-MODE MEASUREMENTS (normal ranges within parentheses): Aorta/Left Atrium: Aortic Root, d (2D): 2.8 cm LV DIASTOLIC FUNCTION: MV Peak E: 6.21 m/s E/e' Ratio: 40.2 LV IVRT: 134 msec Decel Time: 229 msec Right Ventricle: TAPSE: 1.0 cm Aortic Valve: AoV Max Vel: 1.05 m/s AoV Peak PG: 4 mmHg AoV Mean PG: Tricuspid Valve and PA/RV Systolic Pressure: TR Max Velocity: 3.2 m/s RA Pressure: 8 mmHg RVSP/PASP: 49 mmHg Pulmonic Valve: PV Max Velocity: 0.8 m/s PV Max PG: 2 mmHg PV Mean PG: Aorta: Ao Asc: 2.4 cm. PHYSICIAN INTERPRETATION: CONOTRUNCAL ANATOMY: The cardiac structural malformations are consistent with a diagnosis of levo-transposition of the great arteries. 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 The aorta is anterior to the pulmonary artery. There is no sub-aortic obstruction of left ventricular outflow tract. LEFT VENTRICLE: Visually estimated left ventricular ejection fraction 55-60%. Small left ventricular size. RIGHT VENTRICLE: TAPSE 1.0 cm, (DTI 6.0 cm/s). Double out right ventricle with hypoplastic LV and large VSD with ''  functional single ventricle'' Right ventricular hypertrophy. Low normal systemic RV systolic function. RIGHT ATRIUM: Right atrial pressure is estimated at 8 mmHg. Large secundum ASD. Status post extracardiac Fontan. Visualized portions of the Fontan appear patent, no obvious fenestration noted. Unable to obtain images of Glenn shunt. MITRAL VALVE: No evidence of mitral valve regurgitation. TRICUSPID VALVE: Mild tricuspid valve regurgitation. Tricuspid regurgitation velocity is not well seen. AORTIC VALVE: The aortic valve is trileaflet and structurally normal, with normal leaflet excursion. Aorta is anterior of the pulmonic valve. No evidence of aortic regurgitation. PULMONIC VALVE: No indication of pulmonic valve regurgitation. Pulmonic is posterior of the aorta. AORTA: The aortic root is normal in size and structure, normal mid ascending aorta and normal aortic arch. SYSTEMIC VEINS: The inferior vena cava is not well visualized. PERICARDIUM: There is no evidence of pericardial effusion. CONCLUSIONS: 1. Small left ventricular size 2. 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 3. L-transposition of the great arteries. 4. Mild tricuspid valve regurgitation. 5. 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 Low normal systemic RV systolic function.     CPEX/Echo 06/11/15:  BASELINE: Low normal systemic RV systolic function at rest. Hypoplastic LV. ADDITIONAL BASELINE FINDINGS: MITRAL VALVE: Trace mitral valve regurgitation. TRICUSPID VALVE: Trace tricuspid valve regurgitation. No new segmental wall motion abnormalities were seen. Global left ventricular systolic function at peak stress is normal (LVEF 60-65%). Improved systemic RV systolic function with stress. No increase in degree of atrioventricular valve regurgitation with exercise. SUMMARY: 1. Improved systemic RV systolic function with stress. No increase in degree of atrioventricular valve regurgitation with exercise. 2. Low normal systemic RV systolic function at rest. Hypoplastic LV. BASELINE ECG: Ventricular paced rhythm PROTOCOL: Bruce. CONTROL: HR: 74 BP: 120/74 O2 Sat: 95 % START: 1.7 MPH at 10% grade. STOP: 12.7 METS with 4.2 mph at 16 % grade x 1???33'' after 10???33'' total exercise time due to shortness of breath. Peak HR: 141 Peak BP: 136/56 Peak O2 Sat: 95 % Peak Double-Product: 19,176 Maximum VO2 = 1386 ml which is 77 % of predicted, maximum VO2/kg = 24.8 ml/kg which is 88 % of predicted (Note: The reference values are adjusted for weight and modality of exercise) VE/VO2 = 35 VE/VCO2 = 31 VE/VCO2 slope = 23.1 VO2/HR = 9.8 which is 99 % predicted Breathing Reserve = 50 RQ= 1.13 Anaerobic Threshold was reached at HR = 103 VO2 = 1094 ml VO2/kg = 19.6 ml/kg RESULTS: Symptoms: Shortness of breath and leg fatigue; no chest pain or discomfort. ST-T Changes: Uninterpretable due to paced rhythm. Dysrhythmias: Rare PVC during exercise BP Response: Appropriate. IMPRESSIONS: Good exercise tolerance achieving 77 % maximum predicted heart rate, limited by shortness of breath. Exercise induced ST changes are uninterpretable due to paced rhythm. No exercise induced chest pain at a low double product. Rare PVCs as described above. Maximum VO2 = 1386 ml which is 77 % of predicted, maximum VO2/kg = 24.8 ml/kg which is 88 % of predicted . VE/VCO2 slope = 23.1 which is 92 % of predicted. Compared with previous stress/CPX of 04/24/13 maximum VO2/kg had been 25.0 at 11.9 METS    Echocardiogram:  04/21/13: CONCLUSIONS:  1. Double outlet right ventricle.  2. Moderately enlarged right ventricular size and low normal systolic function. 3. Moderately increased RV wall thickness. 4. Mild tricuspid regurgitation. 5. Status post extracardiac Fontan, appears widely patent, no fenestration  noted. Sherrine Maples shunt not well visualized.    LABS:    08/18/18: WBC 5.8 RBC 5.11 Hgb 15 Hct 44.5 Plt 89 NA 139 K 5.3 BUN 16 Creat 0.81 Gluc 71 BNP 149 TSH 2.39  08/08/17: Creat 0.72, BUN 12, K 3.8  07/01/17:  Chol 121, trig 58, HDL 40, LDL 69, Hgb A1c 5.4, INR 1.2, prealbumin 24, TSH 3.6, Hct 43.2, Plat 82k  07/26/16: WBC 3.0, HGB 4.69, HCT 40.7, PLT 75, NA 137, K 3.8, BUN 13, CREAT 0.7, ALK PHOS 57, AST 30, ALT 38, TBil 1.3, GGT 100, PREALB 24, BNP 113, INR 1.2, TSH 4.24, FT3 3.6, FT4 1.05.  04/01/13:  Hgb 14.7, Hct 42.4, MCV 85.1, plat ct 106k, Na 143, K 4.2, creat 0.8, BUN 19, AST 36, ALT 32    Impressions:  1. 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 Richardo Hanks, including bi-atrial Maze procedure and placement of a permanent atrial pacing lead.  2. Postoperative junctional rhythm and bradycardia: re-admitted 4 days after discharge with large right pericardial effusion requiring thoracentesis and placement of an abdominal pacemaker generator and epicardial ventricular lead on 12/05/07 (non-functioning atrial lead).  3. Ascites, improved on aldactone. No evidence of ascites on annual abdominal US.  4. Longstanding history of irregular and heavy menses, thus far unsuccessfully regulated on several types of hormonal therapy. Better controlled on Mirena since 2012.  Also has cervical HPV.  Now with CIN III, underwent LEEP procedure in Jan 2018  5. History of atrial fibrillation, but no post Fontan revision tachyarrhythmias thus far, formerly on coumadin (possible side effect of hair loss), now on ASA.  6. Longstanding elevated left hemidiapragm, s/p successful plication on 03/02/2009 by Dr. Larwance Sachs.  7. Liver US in 2014, 2016 and 2017, and 2018 shows no lesions, but 2017 and 2018 outside liver US report Metavir score of 4, which is consistent with cirrhosis.  2019 liver US at [facility] showed MetaVir stage F2-F3, although elastography impacted by congestive hepatopathy.   8. Cardiac cath and liver biopsy on 08/08/17, with mean Fontan pressure under sedation of , and liver biopsy showing extensive bridging fibrosis but no cirrhosis.  Spirinolactone was increased from 25 to 50mg  to 100mg  after this cath  9. Ventricular pacemaker, pacing 99% of time.   Generator replaced on 12/12/17 by Dr. Larwance Sachs. Deferred placement of transvenous atrial lead via her LPA to minimize V pacing, based on Novah's wish not to be committed to anticoagulation  10. Stable exercise capacity in July 2019, with MVO2 23.7 (85% predicted), previously 24.8 and 88% predicted in 2016    [facility] [place] [facility]    Labs (BMP, BNP, LFTs, GGT, AFP, INR, CBC, Prealbumin):  []  Within last year (date: )  []  Ordered today  [x]  Deferred: Now living in [place]    Hep C screening  [x]  Prior HCV Ab negative (date: 06/21/15)  []  Not indicated (no surgery prior to early 1990s)  []  Ordered    Last hemodynamic cath:   [x]  Within last 10 years (date: 08/08/17), see above results (prior to Fontan revision)  []  Deferred. Reason:   []  Scheduled    Last Liver biopsy  [x]  Within last 10 years (date:08/08/17), see above results  []  Scheduled with cath Liver Imaging:  []  MRI Abdomen within  last 5 years (date: ), see above  []  Triple phase CT within last 5 years (date: ), see above  [x]  Liver ultrasound completed (date: 05/13/18), see above  []  Ordered today, see above.     Advanced cardiac imaging:  []  Cardiac MRI (date: ), see above  [x]  Cardiac CT (date: 12/12/2007), see above  [x]  Deferred. Reason: Pacemaker  []  Ordered today, see above     PDE-5 Inhibitor recommended:  [x]  On sildenafil []  On tadalafil  []  Not tolerated. Previously on []  sildenafil (Year: )  []  tadalafil (Year: )   Side effect:  []  Deferred. Reason:   []  Ordered today, see above    Echo  [x]  Within last year (date: 05/13/18), see above results  []  Deferred. Reason:   []  Ordered today    Cardiopulmonary exercise test  [x]  Within last 3 years (date: 05/13/18), see above results  []  Deferred. Reason:   []  Ordered today    Advanced care planning  [x]  Date: Advanced directive in [place] from 12/27/2010  []  Deferred. Reason:       Recommendations:   1.  Stable from a cardiac perspective. Reassured her regarding CAM monitor findings.   2. Continue ASA 81mg ,  furosemide 80 daily, spirinolactone 100mg ,  and sildenafil 20mg  TID     3. Continue regular exercise as tolerated, as well as diaphragmatic conditioning exercises with inspirometer.    4. Continue regular pacemaker surveillance with Dr. Carollee Herter. She is aware of the issues with her current pacing system (single site pacing) and we have previously discussed the option of using transvenous leads in her LPA, with screw in lead to roof of atrium, but this would require anticoagulation longterm.   She was reluctant to consider this, since she bruises readily with ASA.  She understands that ultimately this will be required to prevent degeneration of ventricular function from single site pacing.     5. Resume regular dental care as able (when COVID pandemic subsides)  6. Follow up in person with repeat echocardiogram, liver US and labs when back in [place]. 7. For insomnia, advised using camomile tea prior to sleep and also to consider deep breathing/meditation/stretching in the evening prior to going to bed.

## 2019-05-22 ENCOUNTER — Telehealth: Payer: BLUE CROSS/BLUE SHIELD | Attending: Cardiovascular Disease

## 2019-05-22 DIAGNOSIS — Z950 Presence of cardiac pacemaker: Secondary | ICD-10-CM

## 2019-05-22 DIAGNOSIS — Q204 Double inlet ventricle: Secondary | ICD-10-CM

## 2019-05-22 DIAGNOSIS — Q201 Double outlet right ventricle: Secondary | ICD-10-CM

## 2019-06-08 ENCOUNTER — Ambulatory Visit: Payer: PRIVATE HEALTH INSURANCE

## 2019-06-08 DIAGNOSIS — Q201 Double outlet right ventricle: Secondary | ICD-10-CM

## 2019-06-08 DIAGNOSIS — Z950 Presence of cardiac pacemaker: Secondary | ICD-10-CM

## 2019-06-08 DIAGNOSIS — Z9889 Other specified postprocedural states: Secondary | ICD-10-CM

## 2019-06-08 DIAGNOSIS — Q204 Double inlet ventricle: Secondary | ICD-10-CM

## 2019-06-08 MED ORDER — FUROSEMIDE 80 MG PO TABS
80 mg | ORAL_TABLET | Freq: Every day | ORAL | 3 refills | Status: AC
Start: 2019-06-08 — End: ?

## 2019-07-10 ENCOUNTER — Ambulatory Visit: Payer: BLUE CROSS/BLUE SHIELD | Attending: Cardiovascular Disease

## 2019-07-14 ENCOUNTER — Ambulatory Visit: Payer: BLUE CROSS/BLUE SHIELD | Attending: Cardiovascular Disease

## 2019-07-15 ENCOUNTER — Ambulatory Visit: Payer: BLUE CROSS/BLUE SHIELD

## 2019-07-15 DIAGNOSIS — Z9889 Other specified postprocedural states: Secondary | ICD-10-CM

## 2019-07-15 DIAGNOSIS — Q201 Double outlet right ventricle: Secondary | ICD-10-CM

## 2019-07-15 DIAGNOSIS — Z950 Presence of cardiac pacemaker: Secondary | ICD-10-CM

## 2019-07-15 DIAGNOSIS — Q204 Double inlet ventricle: Secondary | ICD-10-CM

## 2019-07-15 MED ORDER — SPIRONOLACTONE 50 MG PO TABS
100 mg | ORAL_TABLET | Freq: Every day | ORAL | 3 refills | Status: AC
Start: 2019-07-15 — End: ?

## 2019-07-27 ENCOUNTER — Ambulatory Visit: Payer: BLUE CROSS/BLUE SHIELD

## 2019-07-27 DIAGNOSIS — Z9889 Other specified postprocedural states: Secondary | ICD-10-CM

## 2019-07-27 DIAGNOSIS — Q201 Double outlet right ventricle: Secondary | ICD-10-CM

## 2019-07-29 ENCOUNTER — Ambulatory Visit: Payer: BLUE CROSS/BLUE SHIELD | Attending: Cardiovascular Disease

## 2019-07-29 DIAGNOSIS — Q201 Double outlet right ventricle: Secondary | ICD-10-CM

## 2019-07-29 DIAGNOSIS — Z9889 Other specified postprocedural states: Secondary | ICD-10-CM

## 2019-07-29 NOTE — Progress Notes
[facility]     Date of Visit: 07/29/2019   Reason for Visit: Routine follow up s/p cardiac catheterization on 08/08/17, in setting of DORV s/p Fontan conversion to extracardiac baffle on 11/21/07, and subsequent placement of epicardial permanent pacemaker.     HPI: Ann Duran is a 43 y.o. woman with the following cardiac history:  1. 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  2. Underwent modified Fontan procedure (patch connection of the IVC/SVC via the right atrium to MPA) at age 38 yrs at the [facility].  3. Postoperative chylothorax [phone], subsequently requiring exploration by a left thoracotomy, with ligation of lymphatic vessel and thoracic duct.  4. Suffered fatigue and exercise intolerance in November 2008, and was found to be in atrial fibrillation.  5. Underwent cardioversion at [facility] by Dr. Charlesetta Garibaldi on 10/14/07, along with cardiac cath which revealed excellent RA/Fontan pressures with a mean of 12-41mmHg.  6. Underwent Fontan conversion on 11/21/07, involving takedown of previous Fontan and placement of an extracardiac Fontan and bidirectional Glenn shunt and placement of epicardial permanent pacing leads. Discharged on 11/29/07  7. Re-admitted on 12/03/07 for thoracentesis of large right pleural effusion, in the setting of bradycardia and junctional rhythm  8. Underwent surgical placement of abdominal pacemaker generator on 12/05/07, but atrial lead was non-capturing, left with single site ventricular pacing.  9. Discharged on 12/12/07, after aggressive diuresis in the setting of significant ascites and perineal edema.  10. Subsequent diuresis over the next 6 weeks with resolution of ascites on moderate dose lasix and moderate dose aldactone.  11. underwent left hemidiaphragm plication by Dr. Larwance Sachs on 03/02/2009, unsuccessful attempt at placement of an atrial lead. 12. PPM rate set at VVI 90 bpm during the hospitalization, reduced to 60 bpm by Dr. Carollee Herter on 03/29/2009.  13. In the setting of poor chronotropic response to exercise, rate response activated by Dr. Carollee Herter in July 2011 with significantly improved exercise ability.  14. Underwent placement of Mirena IUD and D & C by Dr. Ardyth Man Parvatenini in February 2012. No cardiovascular complications.  Mirena IUD replaced with LEEP procedure in January 2018  15. 15. Good functional capacity with MVO2 of 24.8 (88% predicted) in August 2016.  Low normal single RV function, improves with exercise.  No exercise induced arrhythmias  16. 16. V-paced 99% of time at rate of 70, generator nearing ERI as of May 2018 remote check, requiring monthly checks to detect ERI.   17. 17. Liver US in Sept 2017 showed no lesions but METAVIR score was 4 consistent with cirrhosis.  18. 18. Underwent cardiac cath on 08/08/17 by Dr. Tobie Poet, showing mean Fontan pressures of , and liver biopsy showing extensive bridging fibrosis, but no cirrhosis.  Spirinolactone dose was doubled from 25mg  daily to 50mg  daily in an attempt to reduce Fontan volume pressure.  19. 19. Underwent pacemaker generator replacement ([employer]tronic) on 12/12/17 by Dr. Larwance Sachs    Interval History: Ann Duran presents today for a follow up. She was last seen for a telehealth visit on 05/22/2019. At that time, she was in [place] and struggling with the humidity. She reported decreased exercise tolerance and insomnia. Today, she still continues to feel that exercise is difficult.     She reports decreased exercise tolerance as well as episodes of facial swelling in the morning. Sometimes taking a deep breath is also difficult. No problems with chest pain, dizziness, syncope or LE edema.     Exercise:  Walking, yoga for 20-30 minutes but sporadic in frequency.   Dental: Last seen earlier this summer. Does take SBE prophylaxis. Asking about Dr. Kyra Leyland at [facility] as she would like to establish local [facility] care during her time in [place].     Past Medical History: As above.  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 Darlyn Chamber here at [facility].  LEEP in Jan 2018.  Mirena IUD replaced in Jan 2018.  Nose bleeds s/p bipolar cauterization 10/2016    Allergies: No Known Allergies     Medications:   ???  furosemide 80 mg tablet, Take 1 tablet (80 mg total) by mouth daily.  ???  pantoprazole 20 mg DR tablet, Take 1 tablet (20 mg total) by mouth daily as needed.  ???  potassium chloride 10 MEQ tablet, Take 2 tablets (20 mEq total) by mouth two (2) times daily.  ???  sildenafil 20 mg tablet, TAKE 1 TABLET (20MG ) BY MOUTH THREE TIMES DAILY. GENERIC FOR REVATIO.CALL [phone] TO REFILL.  ???  spironolactone 50 mg tablet, Take 2 tablets (100 mg total) by mouth daily.  ???  alprazolam 0.5 mg tablet, Take 0.25 mg by mouth at bedtime as needed.  ???  Ascorbic Acid (VITAMIN C) 1000 MG tablet, Take 1,000 mg by mouth.  ???  aspirin (ASPIRIN) 81 mg EC tablet, Take 81 mg by mouth daily  ???  cyanocobalamin 1000 mcg tablet, Take 1,000 mcg by mouth as needed for   ???  fluticasone 50 mcg/act nasal spray, instill 1 spray into each nostril once daily  ???  LORazepam 0.5 mg tablet, Take 0.5 mg by mouth as needed for for Sleep.  ???  Omega-3 Fatty Acids (FISH OIL) 500 mg CAPS capsule, Take 500 mg by mouth as needed for.  ???  zolpidem (AMBIEN) 5 mg tablet, Take 2.5 mg by mouth at bedtime as needed for Sleep.      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  Nonsmoker, occas ETOH.    Family History: Mother and father in their 34s in good health. Siblings, she has1 brother who is in good health. There is no history of congenital heart disease in the family.    ROS: Negative and noncontributory except as noted above in HPI and Interval Events.     Physical Exam: VS: BP 127/73  ~ Pulse 77  ~ Temp 36.5 ???C (97.7 ???F) (Skin)  ~ Resp 18  ~ Ht 5' 5'' (1.651 m)  ~ Wt 130 lb 1.6 oz (59 kg)  ~ SpO2 (!) 88%  ~ BMI 21.65 kg/m???    General: Pleasant thin woman, in NAD.  Skin: No rashes. Sternotomy and left thoracotomy scars well healed.  Head and Neck: Pupils are equal and reacting.   Mouth: Normal oral mucosa. Excellent dentition  Neck:Jugular venous pressure ~12cm  Chest:  Lungs clear bilaterally   Heart: Single S1 with no systolic murmurs or rubs. No diastolic murmurs or gallops.  Abdomen: Not distended, tympanic to percussion, nontender, liver not enlarged, no ascites noted.  Extremities: No edema, mild varicosity on inner thighs and lower right leg. Resolving bruise at right upper thigh  Neurologic: Alert and oriented x 4.  Psychologic: Normal mood and affect.      Cardiac Diagnostic Data:  Echocardiogram 07/29/2019:  2D AND M-MODE MEASUREMENTS (normal ranges within parentheses): Aorta/Left Atrium: Aortic Root, d (2D): 3.0 cm Aortic Valve: AoV Max Vel: 0.84 m/s  AoV Peak PG: 3 mmHg AoV Mean PG: Tricuspid Valve and PA/RV Systolic Pressure: RA Pressure: 8 mmHg Aorta: Ao Asc: 2.4 cm Ao Arch: 2.2 cm Ao Desc: 1.7 cm   PHYSICIAN INTERPRETATION: CONOTRUNCAL ANATOMY: The cardiac structural malformations are consistent with a diagnosis of levo-transposition of the great arteries. LEFT VENTRICLE: Patient demonstrated normal sinus rhythm during echocardiogram. 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 RIGHT VENTRICLE: Double out right ventricle with hypoplastic LV and large VSD with '' functional single ventricle'' Right ventricular hypertrophy. Low normal systemic RV systolic function. RIGHT ATRIUM: Right atrial pressure is estimated at 8 mmHg. Right atrial pressure is estimated at 8 mmHg. Large secundum ASD. Status post extracardiac Fontan. Visualized portions of the Fontan appear patent, no obvious fenestration noted. Sherrine Maples shunt appears normal. MITRAL VALVE: No evidence of mitral valve regurgitation. TRICUSPID VALVE: Trace tricuspid valve regurgitation. AORTIC VALVE: The aortic valve was not well visualized. Aorta is anterior of the pulmonic valve. No evidence of aortic valve regurgitation. PULMONIC VALVE: The pulmonic valve was not well visualized. AORTA: No coarctation of the aorta. PULMONARY ARTERY: The pulmonary artery is not well seen. SYSTEMIC VEINS: The inferior vena cava is normal in size and exhibits less than 50% respiratory change. PERICARDIUM: There is no evidence of pericardial effusion. CONCLUSIONS :  1. L-transposition of the great arteries.  2. 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 Low normal systemic RV systolic function.  3. Double outlet right ventricle-doubly commited ventricular septal defect-aorta anterior to pulmonary artery-no left ventricular outflow tract obstruction.  4. 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  5. Large secundum ASD. Status post extracardiac Fontan. Visualized portions of the Fontan appear patent, no obvious fenestration noted. Sherrine Maples shunt appears normal.  6. A prior echo performed on 05/13/2018 was reviewed for comparison. No significant changes noted since the previous study.    CAM  Monitor 04/12/17:  Predominant rhythm: Paced  ??? Paced rhythm  ??? Ventricular Ectopy = (<1%)  1802 isolated, unifocal PVCs and 208 pairs  ??? Atrial Ectopy = (<1%)  1 isolated PAC  Predominantly V paced rhythm with rare supraventricular beats  (most are labeled PVC's). Symptoms of ''Chest discomfort'  correlated with 2 narrow complex beats/. Symptoms of  Dizziness/Lightheadedness correlated with ventricular pacing.  One of four button presses correlated with a narrow complex  beat, the remaining button presses correlated with ventricular  pacing.      Echocardiogram:  05/13/18  PHYSICIAN INTERPRETATION: CONOTRUNCAL ANATOMY: The cardiac structural malformations are consistent with a diagnosis of levo-transposition of the great arteries.  LEFT VENTRICLE: Patient demonstrated normal sinus rhythm during echocardiogram. 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  RIGHT VENTRICLE: Double out right ventricle with hypoplastic LV and large VSD with '' functional single ventricle'' Right ventricular hypertrophy. Low normal systemic RV systolic function.  LEFT ATRIUM:  RIGHT ATRIUM: Right atrial pressure is estimated at 8 mmHg. Right atrial pressure is estimated at 8 mmHg. Large secundum ASD. Status post extracardiac Fontan. Visualized portions of the Fontan appear patent, no obvious fenestration noted. Sherrine Maples shunt   appears normal.  MITRAL VALVE: No evidence of mitral valve regurgitation.  TRICUSPID VALVE: Trace tricuspid valve regurgitation.  AORTIC VALVE: The aortic valve was not well visualized. Aortic valve area was not quantified by continuity equation on this study. Aorta is anterior of the pulmonic valve.  No evidence of aortic regurgitation.  PULMONIC VALVE: No indication of pulmonic valve regurgitation. Pulmonic is  posterior of the aorta.  AORTA: Normal aortic arch, normal descending aorta, normal mid ascending aorta and the aortic root is normal in size and structure. No coarctation of the aorta.  PULMONARY ARTERY: The pulmonary artery is not well seen.  SYSTEMIC VEINS: The inferior vena cava is normal in size and exhibits less than 50% respiratory change.  PERICARDIUM: There is no evidence of pericardial effusion.  CONCLUSIONS:   1. L-transposition of the great arteries.   2. 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 Low normal systemic RV systolic function.   3. Double outlet right ventricle-doubly commited ventricular septal defect-aorta anterior to pulmonary artery-no left ventricular outflow tract obstruction. 4. 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   5. Large secundum ASD. Status post extracardiac Fontan. Visualized portions of the Fontan appear patent, no obvious fenestration noted. Sherrine Maples shunt appears normal.   6. A prior echo performed on 05/07/2017 was reviewed for comparison. No significant changes noted since the previous study.    Stress Echo/CPX:  05/13/18  BASELINE ECG:   Ventricular paced with biphasic T wave in lead V2  PROTOCOL: Bruce. (Treadmill advanced to stage 3 on its own during stage 2 of exercise after 1')  CONTROL:   HR:  72    BP: 110/64     O2 Sat:  98% (forehead) 92% (finger)   START:  1.7 MPH at 10% grade.   STOP: 11.4 METS with 3.4 mph at 14% grade x 2???37 after 7???37'' total exercise time due to shortness of breath.  Peak HR: 129      Peak BP: 128/66         Peak O2 Sat:  94%   Peak Double-Product: 16,512    Maximum VO2 = 1391 ml which is 77% of predicted, maximum VO2/kg = 23.7 ml/kg which is 85% of predicted (Note: The reference values are adjusted for weight and modality of exercise)  VE/VO2 =  40   VE/VCO2 = 36      VE/VCO2 slope = 30.1  VO2/HR =11.1 which is 111% predicted  Breathing Reserve = 41  RQ= 1.12  Anaerobic Threshold was reached at HR = 103 VO2 =  1144 ml     VO2/kg =  19.5 ml/kg  RESULTS:   Symptoms:  Shortness of breath and leg fatigue, denied chest pain  ST-T Changes: Uninterruptable in the presence of ventricular pacing.  Dysrhythmias:  Rare PVCs during exercise  BP Response:  Blunted.  IMPRESSIONS: Fair exercise tolerance achieving 71% maximum predicted heart rate, limited by shortness of breath. ST changes during exercise are uninterpretable in the presence of ventricular pacing. No chest pain. Rare PVCs during exercise. Blunted BP at a low double product. Maximum VO2/kg = 23.7 ml/kg which is 85% of predicted. VE/VCO2 slope = 30.1 which is 119% of predicted. Compared with previous stress/CPX of 06/21/15 exercise tolerance has decreased from 12.7 METS, MVO2/kg had been 24.8, VE/VCO2 slope had been 23.1.  BASELINE:  Baseline: Single ventricle with low normal systolic function.  ADDITIONAL BASELINE FINDINGS:  LEFT VENTRICLE: Global left ventricular systolic function is mildly decreased (LVEF 40-49%).  MITRAL VALVE: Trace mitral valve regurgitation.  TRICUSPID VALVE: Trace tricuspid valve regurgitation.     Global left ventricular function increased appropriately with stress. No new segmental wall motion abnormalities were seen. Global left ventricular systolic function at peak stress is lower limits of normal (LVEF 50-55%). Mild mitral valve regurgitation.   Mild tricuspid  regurgitation. Post-exercise: mild increase in single ventricle contractility.     SUMMARY:   1. Please see separately resulted cardiopulmonary exercise test for details of exercise performance.   2. DORV, L-TGA, hypoplastic LV, large VSD, large ASD.   3. Baseline: Single ventricle with low normal systolic function.   4. Post-exercise: mild increase in single ventricle contractility    Liver US:  05/13/18  IMPRESSION:  1. Irregular liver contour and coarsened in echotexture representing underlying chronic liver disease/fibrosis. No focal mass. Cholelithiasis. Splenomegaly supports portal hypertension. Normal liver Doppler.  2. Shear wave liver stiffness measurement indicating moderate to severe fibrosis (MetaVir stages F2-F3). Note, the morphologic appearance of the liver appears more fibrotic than Elastography measurements indicate.    Cardiac Cath:  08/08/17:      Liver US  07/01/17 (outside report) shows no lesions, Metavir score of 4.    Echocardiogram:  05/07/17 PHYSICIAN INTERPRETATION:  CONOTRUNCAL ANATOMY: The cardiac structural malformations are consistent with a diagnosis of levo-transposition of the great arteries.  LEFT VENTRICLE: Visually estimated left ventricular ejection fraction 55-60%. Patient demonstrated normal sinus rhythm during echocardiogram. 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  RIGHT VENTRICLE: Double out right ventricle with hypoplastic LV and large VSD with '' functional single ventricle'' Right ventricular hypertrophy. Low normal systemic RV systolic function.  RIGHT ATRIUM: Right atrial pressure is estimated at 8 mmHg. Right atrial pressure is estimated at 8 mmHg. Large secundum ASD. Status post extracardiac Fontan. Visualized portions of the Fontan appear patent, no obvious fenestration noted. Sherrine Maples shunt   appears normal.  MITRAL VALVE: No evidence of mitral valve regurgitation.  TRICUSPID VALVE: Mild tricuspid valve regurgitation.  AORTIC VALVE: The aortic valve was not well visualized. Aorta is anterior of the pulmonic valve.  No evidence of aortic regurgitation.  PULMONIC VALVE: No indication of pulmonic valve regurgitation. Pulmonic is posterior of the aorta.  AORTA: Normal aortic arch and normal descending aorta. The ascending aorta was not well visualized. Mildly enlarged aortic root (4.2 cm). No coarctation of the aorta.  PULMONARY ARTERY: The pulmonary artery is not well seen.  SYSTEMIC VEINS: The inferior vena cava is normal in size and exhibits less than 50% respiratory change.  PERICARDIUM: There is no evidence of pericardial effusion.  CONCLUSIONS:   1. L-transposition of the great arteries.   2. Double outlet right ventricle-doubly commited ventricular septal defect-aorta anterior to pulmonary artery-no left ventricular outflow tract obstruction.   3. 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 Low normal systemic RV systolic function.   4. Large secundum ASD. Status post extracardiac Fontan. Visualized portions of the Fontan appear patent, no obvious fenestration noted. Sherrine Maples shunt appears normal.   5. Left ventricular ejection fraction is approximately 55-60%. 6. Mildly enlarged aortic root (4.2 cm).   7. Mild tricuspid valve regurgitation.   8. A prior echo performed on 03/09/2016 was reviewed for comparison. No significant changes noted since the previous study.   9. 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     ECG 11/07/16: Ventricular paced. Ventricular rate of 75 bpn, QRS duration 184 ms. QT/QTc 498/556 ms    Liver Ultrasound 07/27/16 per report from OSH: Liver 17.5 cm in length. It is normal in echodensity. Liver contour is smooth. No focal lesion os identified. Hepatopedal flow is identifies in the main, right, and left portal veins. Gallbladder and biliary tree: There are several small mobile gallstones. There is no focal tenderness. The  gallbladder wall is thickened measuring 0.7 cm. There is no pericholecystic fluid. No intra or extrahepatic biliary ductal dilatation.      Echocardiogram 03/09/16: CONOTRUNCAL ANATOMY: The cardiac structural malformations are consistent with a diagnosis of levo-transposition of the great arteries. The observed structural malformations are consistent with the diagnosis of double outlet right ventricle with doubly???commited ventricular septal defect. The aorta is anterior to the pulmonary artery. There is no sub-aortic obstruction of left ventricular outflow tract. LEFT VENTRICLE: Normal left ventricular size. Visually estimated left ventricular ejection fraction 55-60%. Small left ventricular size. RIGHT VENTRICLE: (DTI 6.0 cm/s). 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 Low normal systemic RV systolic function. LEFT ATRIUM: Mild left atrial enlargement. RIGHT ATRIUM: Right atrial pressure is estimated at 8 mmHg. Large secundum ASD. Status post extracardiac Fontan. Visualized portions of the Fontan appear patent, no obvious fenestration noted. Sherrine Maples shunt appears normal. MITRAL VALVE: No evidence of mitral valve regurgitation. TRICUSPID VALVE: Mild tricuspid valve regurgitation. AORTIC VALVE: The aortic valve is trileaflet and structurally normal, with normal leaflet excursion. Aorta is anterior of the pulmonic valve. No evidence of aortic regurgitation. PULMONIC VALVE: No indication of pulmonic valve regurgitation. Pulmonic is posterior of the aorta. AORTA: Normal mid ascending aorta and normal aortic arch. Mildly enlarged aortic root (3.9 cm). SYSTEMIC VEINS: The inferior vena cava is normal in size and exhibits less than 50% respiratory change. PERICARDIUM: There is no evidence of pericardial effusion. ?????? CONCLUSIONS: ???1. 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 Low normal systemic RV systolic function. ???2. Left ventricular ejection fraction is approximately 55-60%. ???3. Double outlet right ventricle. ???4. Double outlet right ventricle ????????? -doubly commited ventricular septal defect ????????? -aorta anterior to pulmonary artery ????????? -no left ventricular outflow tract obstruction. ???5. L-transposition of the great arteries. ???6. Mild left atrial enlargement. ???7. Large secundum ASD. Status post extracardiac Fontan. Visualized portions of the Fontan appear patent, no obvious fenestration noted. Sherrine Maples shunt appears normal. ???8. Mild tricuspid valve regurgitation. ???9. Mildly enlarged aortic root (3.9 cm).    Abdominal US 06/21/15:  The pancreas is partially visualized and grossly unremarkable. The liver is mildly heterogeneous in echogenicity. There is no focal liver lesion. Portal vein demonstrates hepatopetal flow. No intra- or extrahepatic biliary dilation. The common bile duct is normal in caliber. The gallbladder is normally distended, containing gallstones. No gallbladder wall thickening or pericholecystic fluid. No sonographic Murphy's sign. The spleen is mildly enlarged. The kidneys are normal in size. Both kidneys demonstrate normal cortical thickness and echogenicity. No hydronephrosis. There is no ascites. The visualized proximal aorta and IVC are unremarkable. MEASUREMENTS: Liver: 15.1 cm Common Duct: 3 mm Right Kidney: 10.5 cm Left Kidney: 11.5 cm Spleen: 17 cm Aorta: 1.1 cm SHEAR WAVE LIVER STIFFNESS MEASUREMENTS: Mean 1.63 +/-0.12 m/sec, Median 1.66 m/sec, equating to 5.7-12 kPA. REFERENCE (m/s, kPa): Normal: 0.81-1.22 m/s 2.0-4.5 kPa (METAVIR F0) Normal/mild: 1.22-1.37 m/s 4.5-5.7 kPa (METAVIR F0-F1) Mild/moderate: 1.37-2 m/s 5.7-12.0 kPa (METAVIR F2-F3) Moderate/severe: 2-2.64 m/s 12.0-21.0 kPa (METAVIR F3-F4) Severe: >2.64 m/s >21.0 kPa (METAVIR F4) IMPRESSION: 1. Heterogenous liver echogenicity, suggesting diffuse liver disease. No suspicious liver lesions. Patent hepatic vasculature. 2. Shear wave liver stiffness measurement indicating mild/moderate liver fibrosis, MetaVir score of F2-3. 3. Splenomegaly. 4. Cholelithiasis without evidence of acute cholecystitis.    Echocardiogram 06/21/15:  2D AND M-MODE MEASUREMENTS (normal ranges within parentheses): Aorta/Left Atrium: Aortic Root, d (  2D): 2.8 cm LV DIASTOLIC FUNCTION: MV Peak E: 1.61 m/s E/e' Ratio: 40.2 LV IVRT: 134 msec Decel Time: 229 msec Right Ventricle: TAPSE: 1.0 cm Aortic Valve: AoV Max Vel: 1.05 m/s AoV Peak PG: 4 mmHg AoV Mean PG: Tricuspid Valve and PA/RV Systolic Pressure: TR Max Velocity: 3.2 m/s RA Pressure: 8 mmHg RVSP/PASP: 49 mmHg Pulmonic Valve: PV Max Velocity: 0.8 m/s PV Max PG: 2 mmHg PV Mean PG: Aorta: Ao Asc: 2.4 cm. PHYSICIAN INTERPRETATION: CONOTRUNCAL ANATOMY: The cardiac structural malformations are consistent with a diagnosis of levo-transposition of the great arteries. 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 The aorta is anterior to the pulmonary artery. There is no sub-aortic obstruction of left ventricular outflow tract. LEFT VENTRICLE: Visually estimated left ventricular ejection fraction 55-60%. Small left ventricular size. RIGHT VENTRICLE: TAPSE 1.0 cm, (DTI 6.0 cm/s). 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 Low normal systemic RV systolic function. RIGHT ATRIUM: Right atrial pressure is estimated at 8 mmHg. Large secundum ASD. Status post extracardiac Fontan. Visualized portions of the Fontan appear patent, no obvious fenestration noted. Unable to obtain images of Glenn shunt. MITRAL VALVE: No evidence of mitral valve regurgitation. TRICUSPID VALVE: Mild tricuspid valve regurgitation. Tricuspid regurgitation velocity is not well seen. AORTIC VALVE: The aortic valve is trileaflet and structurally normal, with normal leaflet excursion. Aorta is anterior of the pulmonic valve. No evidence of aortic regurgitation. PULMONIC VALVE: No indication of pulmonic valve regurgitation. Pulmonic is posterior of the aorta. AORTA: The aortic root is normal in size and structure, normal mid ascending aorta and normal aortic arch. SYSTEMIC VEINS: The inferior vena cava is not well visualized. PERICARDIUM: There is no evidence of pericardial effusion. CONCLUSIONS: 1. Small left ventricular size 2. 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 3. L-transposition of the great arteries. 4. Mild tricuspid valve regurgitation. 5. 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 Low normal systemic RV systolic function.     CPEX/Echo 06/11/15:  BASELINE: Low normal systemic RV systolic function at rest. Hypoplastic LV. ADDITIONAL BASELINE FINDINGS: MITRAL VALVE: Trace mitral valve regurgitation. TRICUSPID VALVE: Trace tricuspid valve regurgitation. No new segmental wall motion abnormalities were seen. Global left ventricular systolic function at peak stress is normal (LVEF 60-65%). Improved systemic RV systolic function with stress. No increase in degree of atrioventricular valve regurgitation with exercise. SUMMARY: 1. Improved systemic RV systolic function with stress. No increase in degree of atrioventricular valve regurgitation with exercise. 2. Low normal systemic RV systolic function at rest. Hypoplastic LV. BASELINE ECG: Ventricular paced rhythm PROTOCOL: Bruce. CONTROL: HR: 74 BP: 120/74 O2 Sat: 95 % START: 1.7 MPH at 10% grade. STOP: 12.7 METS with 4.2 mph at 16 % grade x 1???33'' after 10???33'' total exercise time due to shortness of breath. Peak HR: 141 Peak BP: 136/56 Peak O2 Sat: 95 % Peak Double-Product: 19,176 Maximum VO2 = 1386 ml which is 77 % of predicted, maximum VO2/kg = 24.8 ml/kg which is 88 % of predicted (Note: The reference values are adjusted for weight and modality of exercise) VE/VO2 = 35 VE/VCO2 = 31 VE/VCO2 slope = 23.1 VO2/HR = 9.8 which is 99 % predicted Breathing Reserve = 50 RQ= 1.13 Anaerobic Threshold was reached at HR =  103 VO2 = 1094 ml VO2/kg = 19.6 ml/kg RESULTS: Symptoms: Shortness of breath and leg fatigue; no chest pain or discomfort. ST-T Changes: Uninterpretable due to paced rhythm. Dysrhythmias: Rare PVC during exercise BP Response: Appropriate. IMPRESSIONS: Good exercise tolerance achieving 77 % maximum predicted heart rate, limited by shortness of breath. Exercise induced ST changes are uninterpretable due to paced rhythm. No exercise induced chest pain at a low double product. Rare PVCs as described above. Maximum VO2 = 1386 ml which is 77 % of predicted, maximum VO2/kg = 24.8 ml/kg which is 88 % of predicted . VE/VCO2 slope = 23.1 which is 92 % of predicted. Compared with previous stress/CPX of 04/24/13 maximum VO2/kg had been 25.0 at 11.9 METS Echocardiogram:  04/21/13: CONCLUSIONS:  1. Double outlet right ventricle.  2. Moderately enlarged right ventricular size and low normal systolic function. 3. Moderately increased RV wall thickness. 4. Mild tricuspid regurgitation. 5. Status post extracardiac Fontan, appears widely patent, no fenestration noted. Sherrine Maples shunt not well visualized.    Labs:    Component      Latest Ref Rng & Units 07/29/2019   Sodium      135 - 146 mmol/L 136   Potassium      3.6 - 5.3 mmol/L 3.9   Chloride      96 - 106 mmol/L 97   Total CO2      20 - 30 mmol/L 22   Anion Gap      8 - 19 mmol/L 17   Glucose      65 - 99 mg/dL 70   GFR Est.for Non-African Americ      See GFR Additional Information mL/min/1.63m2 89   GFR Est.for African American      See GFR Additional Information mL/min/1.45m2 >89   GFR Additional Information       See Comment   Creatinine      0.60 - 1.30 mg/dL 1.61   Urea Nitrogen      7 - 22 mg/dL 16   Calcium      8.6 - 10.4 mg/dL 9.7   TOTAL PROTEIN      6.1 - 8.2 g/dL 8.3 (H)   Albumin      3.9 - 5.0 g/dL 5.3 (H)   Bilirubin,Total      0.1 - 1.2 mg/dL 1.0   Alkaline Phosphatase      37 - 113 U/L 69   AST (SGOT)      13 - 47 U/L 26   ALT (SGPT)      8 - 64 U/L 24   White Blood Cell Count      4.16 - 9.95 x10E3/uL 4.46   Red Blood Cell Count      3.96 - 5.09 x10E6/uL 5.13 (H)   Hemoglobin      11.6 - 15.2 g/dL 09.6   Hematocrit      34.9 - 45.2 % 45.7 (H)   Mean Corpuscular Volume      79.3 - 98.6 fL 89.1   Mean Corpuscular Hemoglobin      26.4 - 33.4 pg 29.4   MCH Concentration      31.5 - 35.5 g/dL 04.5   Red Cell Distribution Width-SD      36.9 - 48.3 fL 44.5   Red Cell Distribution Width-CV      11.1 - 15.5 % 13.6   Platelet Count, Auto      143 - 398 x10E3/uL 124 (L)   Mean Platelet Volume  9.3 - 13.0 fL 12.1   Nucleated RBC%, automated      No Ref. Range % 0.0   Absolute Nucleated RBC Count      0.00 - 0.00 x10E3/uL 0.00   Prothrombin Time      11.5 - 14.4 seconds 14.7 (H)   INR      . 1.2   TSH 0.3 - 4.7 mcIU/mL 3.7   GGT      7 - 68 U/L 129 (H)   AFP      0 - 6.7 ng/mL 2.9   BNP      <100 pg/mL 134 (H)     08/18/18: WBC 5.8 RBC 5.11 Hgb 15 Hct 44.5 Plt 89 NA 139 K 5.3 BUN 16 Creat 0.81 Gluc 71 BNP 149 TSH 2.39  08/08/17: Creat 0.72, BUN 12, K 3.8  07/01/17:  Chol 121, trig 58, HDL 40, LDL 69, Hgb A1c 5.4, INR 1.2, prealbumin 24, TSH 3.6, Hct 43.2, Plat 82k  07/26/16: WBC 3.0, HGB 4.69, HCT 40.7, PLT 75, NA 137, K 3.8, BUN 13, CREAT 0.7, ALK PHOS 57, AST 30, ALT 38, TBil 1.3, GGT 100, PREALB 24, BNP 113, INR 1.2, TSH 4.24, FT3 3.6, FT4 1.05.  04/01/13:  Hgb 14.7, Hct 42.4, MCV 85.1, plat ct 106k, Na 143, K 4.2, creat 0.8, BUN 19, AST 36, ALT 32    Impressions:  1. 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 Richardo Hanks, including bi-atrial Maze procedure and placement of a permanent atrial pacing lead.  2. Postoperative junctional rhythm and bradycardia: re-admitted 4 days after discharge with large right pericardial effusion requiring thoracentesis and placement of an abdominal pacemaker generator and epicardial ventricular lead on 12/05/07 (non-functioning atrial lead).  3. Ascites, improved on aldactone. No evidence of ascites on annual abdominal US.  4. Longstanding history of irregular and heavy menses, thus far unsuccessfully regulated on several types of hormonal therapy. Better controlled on Mirena since 2012.  Also has cervical HPV.  Now with CIN III, underwent LEEP procedure in Jan 2018  5. History of atrial fibrillation, but no post Fontan revision tachyarrhythmias thus far, formerly on coumadin (possible side effect of hair loss), now on ASA.  6. Longstanding elevated left hemidiapragm, s/p successful plication on 03/02/2009 by Dr. Larwance Sachs. 7. Liver US in 2014, 2016 and 2017, and 2018 shows no lesions, but 2017 and 2018 outside liver US report Metavir score of 4, which is consistent with cirrhosis.  2019 liver US at [facility] showed MetaVir stage F2-F3, although elastography impacted by congestive hepatopathy.   8. Cardiac cath and liver biopsy on 08/08/17, with mean Fontan pressure under sedation of , and liver biopsy showing extensive bridging fibrosis but no cirrhosis.  Spirinolactone was increased from 25 to 50mg  to 100mg  after this cath  9. Ventricular pacemaker, pacing 99% of time.   Generator replaced on 12/12/17 by Dr. Larwance Sachs. Deferred placement of transvenous atrial lead via her LPA to minimize V pacing, based on Eeva's wish not to be committed to anticoagulation  10. Stable exercise capacity in July 2019, with MVO2 23.7 (85% predicted), previously 24.8 and 88% predicted in 2016    [facility]    Labs (BMP, BNP, LFTs, GGT, AFP, INR, CBC, Prealbumin):  [x]  Within last year (  date: 07/29/2019 )  []  Ordered today  []  Deferred:     Hep C screening  [x]  Prior HCV Ab negative (date: 06/21/15)  []  Not indicated (no surgery prior to early 1990s)  []  Ordered    Last hemodynamic cath:   [x]  Within last 10 years (date: 08/08/17), see above results (prior to Fontan revision)  []  Deferred. Reason:   []  Scheduled    Last Liver biopsy  [x]  Within last 10 years (date:08/08/17), see above results  []  Scheduled with cath    Liver Imaging:  []  MRI Abdomen within last 5 years (date: ), see above  []  Triple phase CT within last 5 years (date: ), see above  []  Liver ultrasound completed (date: 05/13/18), see above  [x]  Ordered today, see above.     Advanced cardiac imaging:  []  Cardiac MRI (date: ), see above  [x]  Cardiac CT (date: 12/12/2007), see above  [x]  Deferred. Reason: Pacemaker  []  Ordered today, see above     PDE-5 Inhibitor recommended:  [x]  On sildenafil []  On tadalafil  []  Not tolerated. Previously on []  sildenafil (Year: )  []  tadalafil (Year: )   Side effect:  []  Deferred. Reason:   []  Ordered today, see above    Echo  [x]  Within last year (date: 07/29/2019), see above results  []  Deferred. Reason:   []  Ordered today    Cardiopulmonary exercise test  [x]  Within last 3 years (date: 05/13/18), see above results  []  Deferred. Reason:   []  Ordered today    Advanced care planning  [x]  Date: Advanced directive in [place] from 12/27/2010  []  Deferred. Reason:       Recommendations:   1. We reviewed the results of today's echocardiogram, which is stable.   2. Most recent CAM monitor done earlier this summer showed paced rhythm with rare supraventricular beats.    3. Continue ASA 81mg ,  furosemide 80 daily, spirinolactone 100mg ,  and sildenafil 20mg  TID. We spent time today reviewing the rationale for diuretic and PDE-5 Inhibitors in Fontan patients.   4. Continue regular exercise as tolerated, as well as diaphragmatic conditioning exercises with inspirometer.  Recommend that she increase her conditioning by singing, yoga and using an inspirometer.   5. Continue regular pacemaker surveillance with Dr. Carollee Herter. She is aware of the issues with her current pacing system (single site pacing) and we again reviewed the options for atrial pacing, including using transvenous leads in her LPA, with screw in lead to roof of atrium, but this would require anticoagulation longterm.  She was reluctant to consider this, since she bruises readily with ASA.  She understands that ultimately this will be required to prevent degeneration of ventricular function from single site pacing.   6. Recommend that she obtain a flu shot as well as pneumonia vaccine.   7. Resume regular dental care with biannual dental cleanings.   8. Recommend that she get a liver ultrasound the next time she is in [place]. Follow up with a telehealth visit at the time of liver ultrasound.         Patient seen, examined and discussed with [facility] attending, Dr. Dyanne Iha.    Jed Limerick, NP [facility] Nurse Practitioner    I have seen and examined the patient and I agree with the findings, assessment, and recommendations made above which we developed together and communicated with the patient.

## 2019-07-29 NOTE — Patient Instructions
[facility]    Your visit instructions:  1. For your Fontan circuit, please maintain a low salt diet, do pulmonary exercises (singing/yoga/inspiratory exercises), weights for muscle building and swimming if possible.  2. Please get the pneumonia vaccine (pneumococcal 23 - valent or Pneumovax).  3. Please get the flu shot.  4. Follow up: when back in [place], please get a liver ultrasound here and we can do a telehealth.  5. Okay to see Dr. Juventino Slovak at [facility].   6. We recommend that you have your teeth cleaned every 6 months.  7. Please call with any concerns or changes in your health.    Please visit our website for updates, newsletters and helpful links: [email or web address]    Helpful contact information:  Email: [email or web address]  Appointments: [phone]  [facility] Patient Affairs/Messages: [phone]  Administrative Office: [phone]  Fax: [phone]    Address:    [facility]  [address]  [place], [place] [address]    If you are providing an imaging disc from an outside provider please request that files be supplied in DICOM format.

## 2019-08-04 ENCOUNTER — Ambulatory Visit: Payer: BLUE CROSS/BLUE SHIELD

## 2019-08-04 DIAGNOSIS — Z950 Presence of cardiac pacemaker: Secondary | ICD-10-CM

## 2019-08-04 DIAGNOSIS — R001 Bradycardia, unspecified: Secondary | ICD-10-CM

## 2019-08-06 ENCOUNTER — Ambulatory Visit: Payer: PRIVATE HEALTH INSURANCE

## 2019-08-12 ENCOUNTER — Inpatient Hospital Stay: Payer: BLUE CROSS/BLUE SHIELD | Attending: Pediatric Cardiology

## 2019-08-12 DIAGNOSIS — R001 Bradycardia, unspecified: Secondary | ICD-10-CM

## 2019-08-12 DIAGNOSIS — Z950 Presence of cardiac pacemaker: Secondary | ICD-10-CM

## 2019-08-13 NOTE — Procedures
SEE [place] FOR DOWNLOAD PRINTOUT     Ann Duran is a 43 y.o. female  [id number]  Reading MD:  Dr Larene Beach    A Medtronic Carelink remote monitor was received as a routine download. See Media for full report.

## 2019-09-07 ENCOUNTER — Ambulatory Visit: Payer: BLUE CROSS/BLUE SHIELD

## 2019-09-07 DIAGNOSIS — I2721 Secondary pulmonary arterial hypertension: Secondary | ICD-10-CM

## 2019-09-07 MED ORDER — REVATIO 20 MG PO TABS
20 mg | ORAL_TABLET | Freq: Three times a day (TID) | ORAL | 3 refills | Status: AC
Start: 2019-09-07 — End: ?

## 2019-10-05 ENCOUNTER — Ambulatory Visit: Payer: BLUE CROSS/BLUE SHIELD

## 2019-10-05 DIAGNOSIS — Z950 Presence of cardiac pacemaker: Secondary | ICD-10-CM

## 2019-10-05 DIAGNOSIS — R001 Bradycardia, unspecified: Secondary | ICD-10-CM

## 2019-11-11 ENCOUNTER — Inpatient Hospital Stay: Payer: BLUE CROSS/BLUE SHIELD | Attending: Pediatric Cardiology

## 2019-11-11 DIAGNOSIS — Z950 Presence of cardiac pacemaker: Secondary | ICD-10-CM

## 2019-11-11 DIAGNOSIS — R001 Bradycardia, unspecified: Secondary | ICD-10-CM

## 2019-11-12 NOTE — Procedures
SEE MEDIA SECTION FOR DOWNLOAD PRINTOUT     Ann Duran is a 43 y.o. female  [id number]  Reading MD:  Dr Shannon    A Medtronic Carelink remote monitor was received as a routine download. See Media for full report.

## 2020-01-04 ENCOUNTER — Telehealth: Payer: BLUE CROSS/BLUE SHIELD

## 2020-01-04 NOTE — Telephone Encounter
Patient refused to see another doctor.

## 2020-01-04 NOTE — Telephone Encounter
Appointment Accommodation Request    MD Name: Parvataneni    Appointment Type: RGYN     Reason for sooner request: Patient called stating she will be in town starting wednesday.  For 2 days patient noticed an itchy, tender vaginal bump that looks like a canker sore and would like to be seen at the earliest convenience. CBN: [phone]    Date/Time Requested (If any): Open    Last seen by MD: 05/07/2017    Any Symptoms:  [x]  Yes  []  No      o If yes, what symptoms are you experiencing: itchy, tender bump and looks like canker sore  o Duration of symptoms (how long): 2 days    Patient was offered an appointment but declined.    Patient was advised to seek emergency services if conditions are urgent or emergent.    Patient has been notified of the 24-48 hour turnaround time.

## 2020-01-06 NOTE — Telephone Encounter
Forwarded by: Jayton Popelka Bernice Brittnie Lewey

## 2020-01-07 ENCOUNTER — Ambulatory Visit: Payer: PRIVATE HEALTH INSURANCE

## 2020-01-07 DIAGNOSIS — Z950 Presence of cardiac pacemaker: Secondary | ICD-10-CM

## 2020-01-07 DIAGNOSIS — R001 Bradycardia, unspecified: Secondary | ICD-10-CM

## 2020-01-07 NOTE — Telephone Encounter
Spoke with patient and she advised that appointment is no longer needed, thank you.     Reply by: Tonye Becket

## 2020-02-04 ENCOUNTER — Ambulatory Visit: Payer: BLUE CROSS/BLUE SHIELD

## 2020-02-04 DIAGNOSIS — Z23 Encounter for immunization: Secondary | ICD-10-CM

## 2020-02-09 ENCOUNTER — Ambulatory Visit: Payer: BLUE CROSS/BLUE SHIELD

## 2020-02-09 DIAGNOSIS — Z23 Encounter for immunization: Secondary | ICD-10-CM

## 2020-02-10 ENCOUNTER — Inpatient Hospital Stay: Payer: PRIVATE HEALTH INSURANCE | Attending: Pediatric Cardiology

## 2020-02-10 DIAGNOSIS — R001 Bradycardia, unspecified: Secondary | ICD-10-CM

## 2020-02-10 DIAGNOSIS — Z950 Presence of cardiac pacemaker: Secondary | ICD-10-CM

## 2020-02-11 ENCOUNTER — Telehealth: Payer: PRIVATE HEALTH INSURANCE

## 2020-02-13 NOTE — Procedures
SEE MEDIA SECTION FOR DOWNLOAD PRINTOUT     Ann Duran is a 44 y.o. female  [id number]  Reading MD:  Dr Carollee Herter    A Medtronic Carelink remote monitor was received as a routine download. See Media for full report.

## 2020-02-16 ENCOUNTER — Telehealth: Payer: BLUE CROSS/BLUE SHIELD

## 2020-03-01 ENCOUNTER — Telehealth: Payer: BLUE CROSS/BLUE SHIELD

## 2020-03-01 DIAGNOSIS — F411 Generalized anxiety disorder: Secondary | ICD-10-CM

## 2020-03-15 ENCOUNTER — Ambulatory Visit: Payer: BLUE CROSS/BLUE SHIELD

## 2020-03-25 IMAGING — CR DX Foot Complete RT
3 series · 3 of 3 positions shown · non-contrast
Comparison: None

DX Foot Complete RT
INDICATION: Right foot pain                                                               
 EXAM: 3 views of the right foot performed 03/25/2020

[ap]
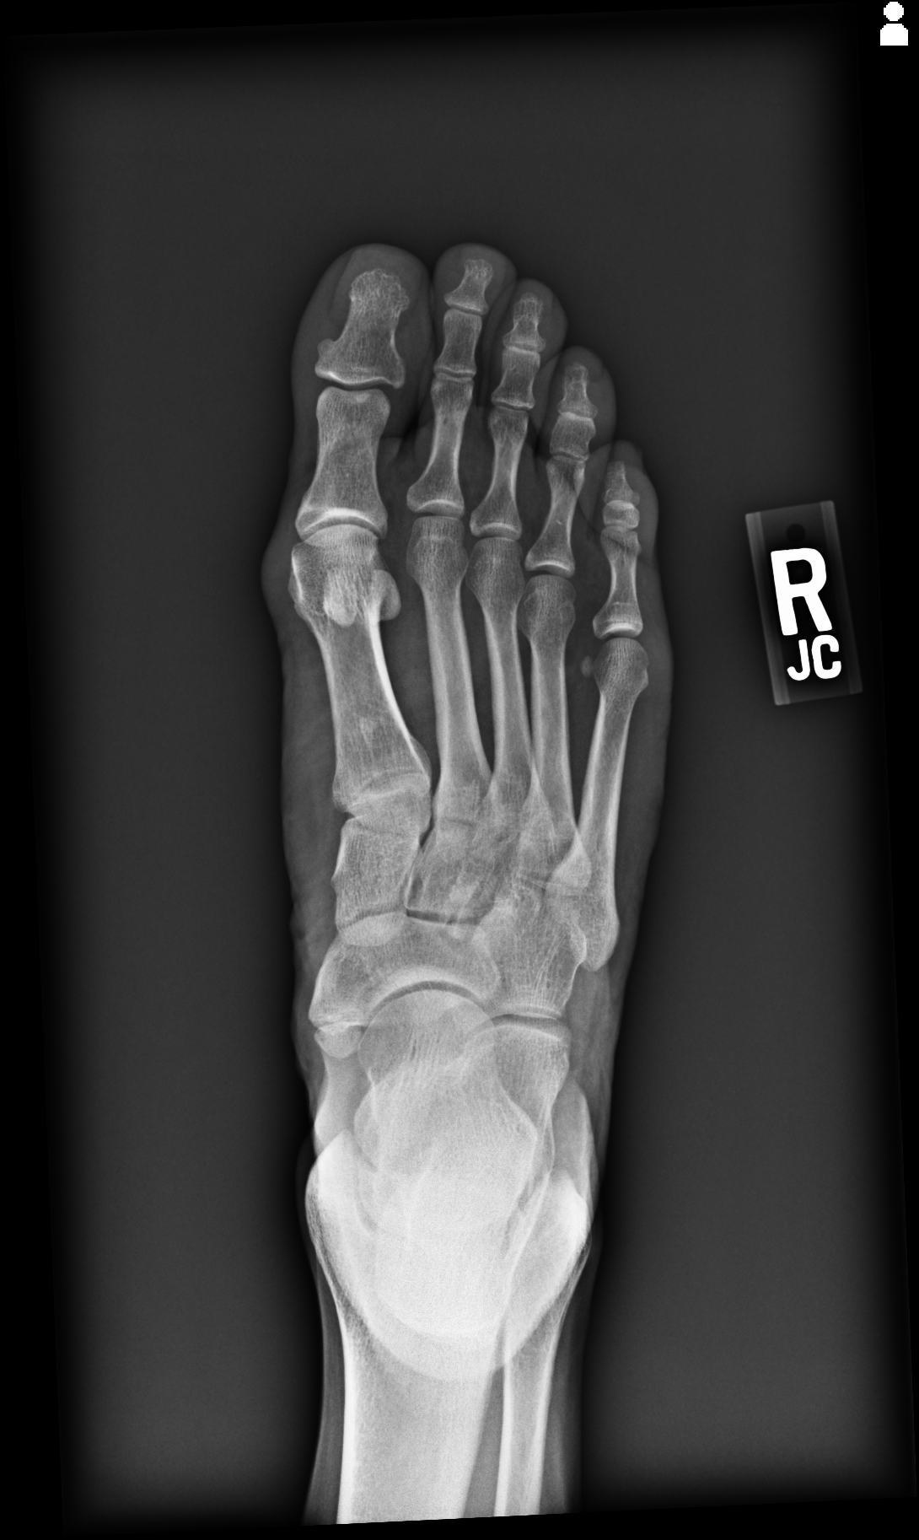

[oblique]
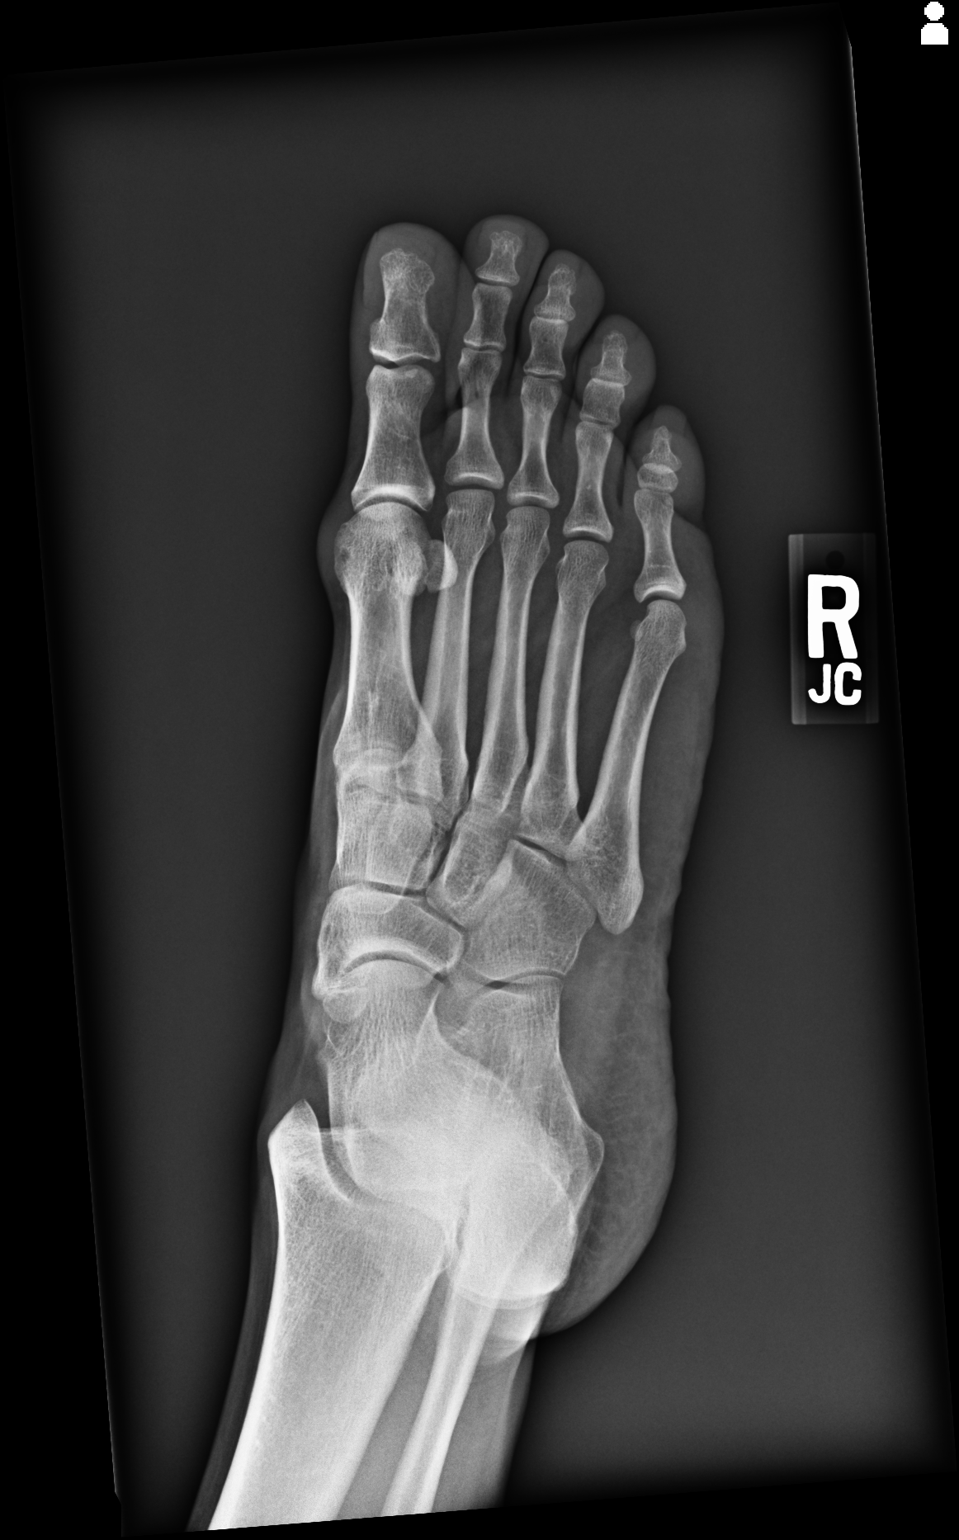

[lat]
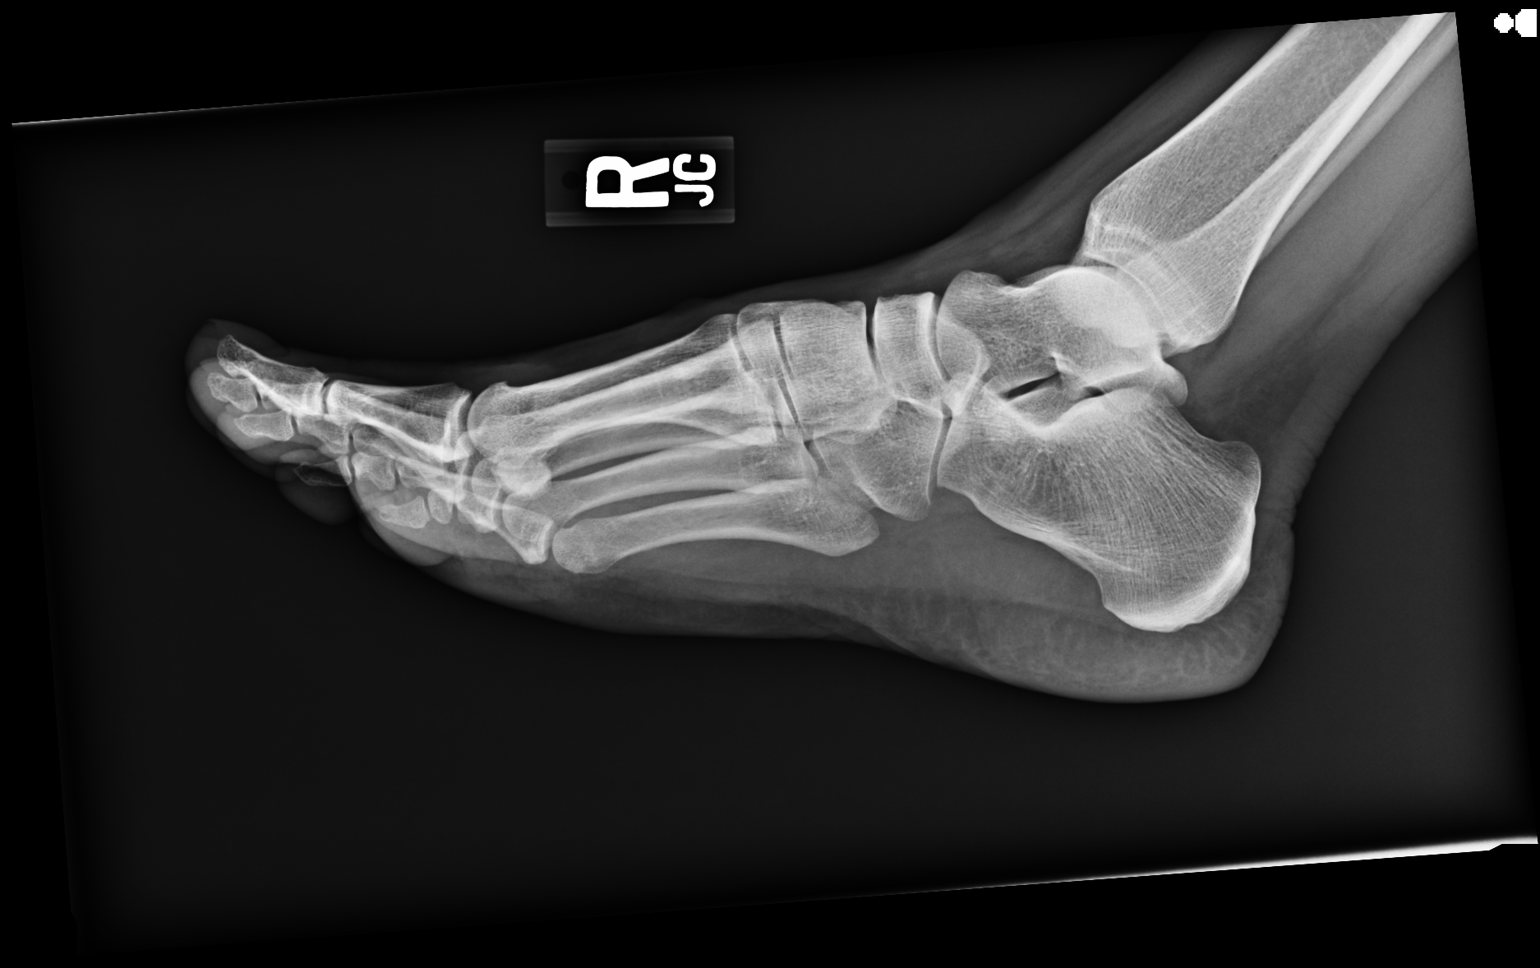

[3 of 3 positions shown; findings below may reference images not displayed]

FINDINGS: Os naviculare is incidentally noted. There is mild degenerative change          
 at the first metatarsophalangeal joint with joint space narrowing and small               
 osteophyte. There is no fracture, dislocation, or bone lesion. No bony erosion            
 is evident. The joint spaces otherwise appear well maintained. The soft tissues           
 are unremarkable.
IMPRESSION: Mild degenerative change of the first metatarsophalangeal joint.                          
 No acute bony injury evident.

## 2020-03-28 ENCOUNTER — Ambulatory Visit: Payer: BLUE CROSS/BLUE SHIELD

## 2020-03-29 ENCOUNTER — Ambulatory Visit: Payer: BLUE CROSS/BLUE SHIELD

## 2020-04-08 MED ORDER — FUROSEMIDE 80 MG PO TABS
ORAL_TABLET | 3 refills
Start: 2020-04-08 — End: ?

## 2020-04-11 MED ORDER — FUROSEMIDE 80 MG PO TABS
ORAL_TABLET | 3 refills | Status: AC
Start: 2020-04-11 — End: ?

## 2020-04-12 ENCOUNTER — Ambulatory Visit: Payer: BLUE CROSS/BLUE SHIELD

## 2020-05-11 ENCOUNTER — Inpatient Hospital Stay: Payer: PRIVATE HEALTH INSURANCE | Attending: Pediatric Cardiology

## 2020-05-12 DIAGNOSIS — Z950 Presence of cardiac pacemaker: Secondary | ICD-10-CM

## 2020-05-12 DIAGNOSIS — R001 Bradycardia, unspecified: Secondary | ICD-10-CM

## 2020-05-13 NOTE — Procedures
SEE MEDIA SECTION FOR DOWNLOAD PRINTOUT     Ann Duran is a 43 y.o. female  [id number]  Reading MD:  Dr     A Medtronic Carelink remote monitor was received as a routine download. See Media for full report.

## 2020-06-13 ENCOUNTER — Telehealth: Payer: BLUE CROSS/BLUE SHIELD

## 2020-06-14 MED ORDER — SPIRONOLACTONE 50 MG PO TABS
ORAL_TABLET | 3 refills
Start: 2020-06-14 — End: ?

## 2020-06-16 MED ORDER — SPIRONOLACTONE 50 MG PO TABS
ORAL_TABLET | 3 refills | Status: AC
Start: 2020-06-16 — End: ?

## 2020-07-05 ENCOUNTER — Ambulatory Visit: Payer: BLUE CROSS/BLUE SHIELD

## 2020-08-08 ENCOUNTER — Ambulatory Visit: Payer: BLUE CROSS/BLUE SHIELD

## 2020-08-08 DIAGNOSIS — R001 Bradycardia, unspecified: Secondary | ICD-10-CM

## 2020-08-08 DIAGNOSIS — Z950 Presence of cardiac pacemaker: Secondary | ICD-10-CM

## 2020-08-09 ENCOUNTER — Ambulatory Visit: Payer: BLUE CROSS/BLUE SHIELD | Attending: Cardiovascular Disease

## 2020-08-09 ENCOUNTER — Ambulatory Visit: Payer: BLUE CROSS/BLUE SHIELD | Attending: Pediatric Cardiology

## 2020-08-09 ENCOUNTER — Ambulatory Visit: Payer: PRIVATE HEALTH INSURANCE | Attending: Cardiovascular Disease

## 2020-08-09 ENCOUNTER — Inpatient Hospital Stay: Payer: BLUE CROSS/BLUE SHIELD | Attending: Pediatric Cardiology

## 2020-08-09 DIAGNOSIS — R001 Bradycardia, unspecified: Secondary | ICD-10-CM

## 2020-08-09 DIAGNOSIS — Q201 Double outlet right ventricle: Secondary | ICD-10-CM

## 2020-08-09 DIAGNOSIS — Z950 Presence of cardiac pacemaker: Secondary | ICD-10-CM

## 2020-08-09 NOTE — Progress Notes
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    Subjective:   Ann Duran is a 44 y.o. female who was seen in clinic for pacemaker evaluation. He cardiac history is remarkable foir a diagnosis of L-transposition/ single ventricle. Her first cardiac surgery was a Modified RA to pA Fonatn procedure at age 89. She required a thoracic duct ligation after her Fontan and then reportedly did well until age 89 when she presented with a complaint of fatigue and was diagnosed with atrial arrhythmias. She then underwent a Fontan conversion in Jan 2009 at which time pacemaker leads were placed but the device was placed 2 weeks later after she developed bradycardia and fluid retention. The atrial lead was non functional and so she was VVI paced. She underwent attempted atrial lead revision with diaphragm plication in April of 2010, but the new atrial lead also failed to function. She was tried on VVI backup pacing but was eventually changed to VVIR at 70 bpm lower rate, which she has tolerated well since 2011, but has had increasing requirement for ventricular pacing.    Interval Events:  Ann Duran has been doing well. She denies any recent palpitations, dizziness, syncope, chest pain or shortness of breath. Has noted more abdominal distension, also notes left sided abdominal pain after eating.     Past Medical History:  Past Medical History:   Diagnosis Date   ??? Anxiety     controlled per pt   ??? Cholelithiases     Noted on ultrasound, asymptomatic    ??? Delayed emergence from general anesthesia 2010    pacemaker insertion, per patient   ??? DORV (double outlet right ventricle)    ??? GERD (gastroesophageal reflux disease)     well controlled w/med and diet   ??? History of blood transfusion    ??? HPV (human papilloma virus) infection    ??? IUD (intrauterine device) in place     Mirena placed 12/27/10, replaced 12/2015, replaced November 16, 2016   ??? 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  Negative tobacco use.  Rare EtOH.    Review of Systems:  14 point review of system negative other than as stated above    Medications:  Outpatient Medications Prior to Visit   Medication Sig Dispense Refill   ??? alprazolam 0.5 mg tablet Take 0.25 mg by mouth at bedtime as needed.     ??? Ascorbic Acid (VITAMIN C) 1000 MG tablet Take 1,000 mg by mouth as needed for .     ??? aspirin (ASPIRIN) 81 mg EC tablet Take 81 mg by mouth daily     ??? cyanocobalamin 1000 mcg tablet Take 1,000 mcg by mouth as needed for .     ??? fluticasone 50 mcg/act nasal spray instill 1 spray into each nostril once daily  0   ??? FUROSEMIDE 80 mg tablet TAKE 1 TABLET BY MOUTH  DAILY 90 tablet 3   ??? potassium chloride 10 mEq CR tablet Take 20 mEq by mouth.     ??? REVATIO 20 MG tablet Take 1 tablet (20 mg total) by mouth three (3) times daily.  270 tablet 3   ??? SPIRONOLACTONE 50 mg tablet TAKE 2 TABLETS BY MOUTH  DAILY 180 tablet 3   ??? valacyclovir 500 mg tablet ValACYclovir HCl - 500 MG Oral Tablet     ??? zolpidem (AMBIEN) 5 mg tablet Take 2.5 mg by mouth at bedtime as needed for Sleep.     ??? ibuprofen (ADVIL) 200 mg tablet Take 400 mg by mouth every six (6) hours as needed.     ??? LORazepam 0.5 mg tablet Take 0.5 mg by mouth as needed for for Sleep.     ??? Omega-3 Fatty Acids (FISH OIL) 500 mg CAPS capsule Take 500 mg by mouth as needed for.       No facility-administered medications prior to visit.      Allergies:  No Known Allergies      Objective:     Vitals:  BP 115/68 (BP Location: Left arm, Patient Position: Sitting, Cuff Size: Adult Small)  ~ Pulse 71  ~ Temp 37 ???C (98.6 ???F) (Tympanic)  ~ Resp 16  ~ Ht 1.651 m (5' 5'')  ~ Wt 59.3 kg (130 lb 11.7 oz)  ~ SpO2 (!) 92%  ~ BMI 21.76 kg/m???  Facility age limit for growth percentiles is 20 years. Facility age limit for growth percentiles is 20 years. Facility age limit for growth percentiles is 20 years.    General:   alert and no distress   Skin:   warm and dry, no hyperpigmentation, vitiligo, or suspicious lesions   Nodes:   cervical, supraclavicular, and axillary nodes normal.   Eyes:   No conjunctival injection, sclerae anicteric   Oropharynx:  lips, mucosa, and tongue normal; teeth and gums normal   Neck:  no adenopathy, no carotid bruit, no JVD, supple, symmetrical, trachea midline and thyroid not enlarged, symmetric, no tenderness/mass/nodules   Lungs:   clear to auscultation bilaterally   Heart:   regular rate and rhythm, S1: single and S2: normal   Abdomen:   soft, non-tender; bowel sounds normal; no masses,  no organomegaly   Extremities:  extremities normal, atraumatic, no cyanosis or edema   Psychiatric:   normal mood, behavior, speech, dress, and thought processes     Pacemaker Evaluation 10/23/17:  Underlying Rhythm: Bradycardia ~50???s bpm  Underlying Rhythm: Ventricular rate ~50 bpm  R wave: 11.6 mV  Impedance:  V: 551 ohms???  Capture threshold  V: 2 V @ 0.8 ms  ???  Tachy episode summary since 12/19/2017:  VT/VF: 0  ???  AP: 0%  VP: 99.8%  ???  Programmed changes: None  ???  Settings:   Mode: VVIR  Rate: Lower/Max Track: 70/ 150 bpm  RV Output: 3.5 V @ 0.52 ms  RV Sensitivity: 2.80 mV    Assessment:   44 y.o. female with DORV status post Fontan conversion to extracardiac baffle on 11/21/2007, with subsequent placement of an epicardial pacemaker for sinus node dysfunction.  She had atrial lead failure shortly after initial placement.  In 2010 she underwent an unsuccessful attempt at placement of a new atrial lead.    She returns today for routine follow up. She underwent pacemaker generator replacement (Medtronic) on 12/12/17 by Dr. Larwance Sachs. She remains in VVI mode. She continues to be stable with ventricular pacing, no indicatioon for intervention at this time.    Plan/ Recommendation:     Remote monitoring every three months and return to clinic in one year.    Vania Rea. Carollee Herter M.D.  Pediatric Cardiology

## 2020-08-10 ENCOUNTER — Inpatient Hospital Stay: Payer: BLUE CROSS/BLUE SHIELD

## 2020-08-10 DIAGNOSIS — R001 Bradycardia, unspecified: Secondary | ICD-10-CM

## 2020-08-10 DIAGNOSIS — Z950 Presence of cardiac pacemaker: Secondary | ICD-10-CM

## 2020-08-11 ENCOUNTER — Ambulatory Visit: Payer: BLUE CROSS/BLUE SHIELD

## 2020-08-11 ENCOUNTER — Telehealth: Payer: BLUE CROSS/BLUE SHIELD

## 2020-08-11 NOTE — Telephone Encounter
Call Back Request      Reason for call back: Per patient she is returning Veronica's call regarding the appointment scheduled for 08/15/20. She states Suzette Battiest was aware that patient has a flight scheduled for that same day and would like to speak with her to know if it is of urgency she keep this appointment and try to change her flight. She also states she is keeping her video appointment with Dr. Tobie Poet tomorrow.    Any Symptoms:  []  Yes  [x]  No      ? If yes, what symptoms are you experiencing:    o Duration of symptoms (how long):    o Have you taken medication for symptoms (OTC or Rx):      Patient or caller has been notified of the 24-48 hour turnaround time.

## 2020-08-11 NOTE — Procedures
SEE MEDIA SECTION FOR DOWNLOAD PRINTOUT     Ann Duran is a 44 y.o. female  [id number]  Reading MD:  Dr Carollee Herter    A Medtronic Carelink remote monitor was received as a routine download. See Media for full report.

## 2020-08-12 ENCOUNTER — Telehealth: Payer: BLUE CROSS/BLUE SHIELD | Attending: Cardiovascular Disease

## 2020-08-12 DIAGNOSIS — Q208 Other congenital malformations of cardiac chambers and connections: Secondary | ICD-10-CM

## 2020-08-12 DIAGNOSIS — Q204 Double inlet ventricle: Secondary | ICD-10-CM

## 2020-08-12 NOTE — Progress Notes
Patient Consent to Telehealth   The patient agreed to participate in the video visit prior to joining the visit.        [facility]     Date of Visit: 08/12/2020   Reason for Visit: Routine follow up s/p cardiac catheterization on 08/08/17, in setting of DORV s/p Fontan conversion to extracardiac baffle on 11/21/07, and subsequent placement of epicardial permanent pacemaker.     HPI: Ann Duran is a 44 y.o. woman with the following cardiac history:  1. 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  2. Underwent modified Fontan procedure (patch connection of the IVC/SVC via the right atrium to MPA) at age 33 yrs at the [facility]  3. Postoperative chylothorax [phone], subsequently requiring exploration by a left thoracotomy, with ligation of lymphatic vessel and thoracic duct.  4. Suffered fatigue and exercise intolerance in November 2008, and was found to be in atrial fibrillation.  5. Underwent cardioversion at [facility] by Dr. Charlesetta Garibaldi on 10/14/07, along with cardiac cath which revealed excellent RA/Fontan pressures with a mean of 12-35mmHg.  6. Underwent Fontan conversion on 11/21/07, involving takedown of previous Fontan and placement of an extracardiac Fontan and bidirectional Glenn shunt and placement of epicardial permanent pacing leads. Discharged on 11/29/07  7. Re-admitted on 12/03/07 for thoracentesis of large right pleural effusion, in the setting of bradycardia and junctional rhythm  8. Underwent surgical placement of abdominal pacemaker generator on 12/05/07, but atrial lead was non-capturing, left with single site ventricular pacing.  9. Discharged on 12/12/07, after aggressive diuresis in the setting of significant ascites and perineal edema.  10. Subsequent diuresis over the next 6 weeks with resolution of ascites on moderate dose lasix and moderate dose aldactone.  11. underwent left hemidiaphragm plication by Dr. Larwance Sachs on 03/02/2009, unsuccessful attempt at placement of an atrial lead.  12. PPM rate set at VVI 90 bpm during the hospitalization, reduced to 60 bpm by Dr. Carollee Herter on 03/29/2009.  13. In the setting of poor chronotropic response to exercise, rate response activated by Dr. Carollee Herter in July 2011 with significantly improved exercise ability.  14. Underwent placement of Mirena IUD and D & C by Dr. Ardyth Man Parvatenini in February 2012. No cardiovascular complications.  Mirena IUD replaced with LEEP procedure in January 2018  15. 15. Good functional capacity with MVO2 of 24.8 (88% predicted) in August 2016.  Low normal single RV function, improves with exercise.  No exercise induced arrhythmias  16. 16. V-paced 99% of time at rate of 70, generator nearing ERI as of May 2018 remote check, requiring monthly checks to detect ERI.   17. 17. Liver US in Sept 2017 showed no lesions but METAVIR score was 4 consistent with cirrhosis.  18. 18. Underwent cardiac cath on 08/08/17 by Dr. Tobie Poet, showing mean Fontan pressures of , and liver biopsy showing extensive bridging fibrosis, but no cirrhosis.  Spirinolactone dose was doubled from 25mg  daily to 50mg  daily in an attempt to reduce Fontan volume pressure.  19. 19. Underwent pacemaker generator replacement ([employer]tronic) on 12/12/17 by Dr. Larwance Sachs  20. Moved to [place] for work in early 2020, had issues with the humidity, insomnia and had some exertional decline.    Interval History: [name] presents today for a follow up. She reports abdominal discomfort and a feeling that her abdomen is more distended, she does not know if this is fluid and she does not have lower ext edema.  She also  has more GERD symptoms and feels that her voice is more hoarse.  She has not been taking the pantoprazole regularly but continues to take ASA daily.      Going back to grad school at [employer] (remote) to get a Master's in social work.    Seen by Dr Carollee Herter on 08/09/20- no concerns re PPM    Was seen by a sleep specialist in [place], Dr Carmin Muskrat, given advice regarding sleep hygiene which improved her sleep quality but also noted to have severely elevated nasal score despite nasal steroid therapy, thought to have septal deviation and surgery being considered for that.    Last abd [place] in 2019- needs updated [place]    Exercise: Walking, yoga for 20-30 minutes but sporadic in frequency.   Dental: Last seen earlier this year. Does take SBE prophylaxis.     She has not set up local [facility] care as of yet.    Past Medical History: As above.  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 Darlyn Chamber here at [facility].  LEEP in Jan 2018.  Mirena IUD replaced in Jan 2018.  Nose bleeds s/p bipolar cauterization 10/2016    Allergies: No Known Allergies     Medications:   ???  furosemide 80 mg tablet, Take 1 tablet (80 mg total) by mouth daily.  ???  pantoprazole 20 mg DR tablet, Take 1 tablet (20 mg total) by mouth daily as needed.  ???  potassium chloride 10 MEQ tablet, Take 2 tablets (20 mEq total) by mouth two (2) times daily.  ???  sildenafil 20 mg tablet, TAKE 1 TABLET (20MG ) BY MOUTH THREE TIMES DAILY. GENERIC FOR REVATIO.CALL [phone] TO REFILL.  ???  spironolactone 50 mg tablet, Take 2 tablets (100 mg total) by mouth daily.  ???  alprazolam 0.5 mg tablet, Take 0.25 mg by mouth at bedtime as needed.  ???  Ascorbic Acid (VITAMIN C) 1000 MG tablet, Take 1,000 mg by mouth.  ???  aspirin (ASPIRIN) 81 mg EC tablet, Take 81 mg by mouth daily  ???  cyanocobalamin 1000 mcg tablet, Take 1,000 mcg by mouth as needed for   ???  fluticasone 50 mcg/act nasal spray, instill 1 spray into each nostril once daily  ???  LORazepam 0.5 mg tablet, Take 0.5 mg by mouth as needed for for Sleep.  ???  Omega-3 Fatty Acids (FISH OIL) 500 mg CAPS capsule, Take 500 mg by mouth as needed for.  ???  zolpidem (AMBIEN) 5 mg tablet, Take 2.5 mg by mouth at bedtime as needed for Sleep.      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  Nonsmoker, occas ETOH.    Family History: Mother and father in their 37s in good health. Siblings, she has1 brother who is in good health. There is no history of congenital heart disease in the family.    ROS: Negative and noncontributory except as noted above in HPI and Interval Events.     Physical Exam:  VS: There were no vitals taken for this visit.     At last in person visit:  General: Pleasant thin woman, in NAD.  Skin: No rashes. Sternotomy and left thoracotomy scars well healed.  Head and Neck: Pupils are equal and reacting.   Mouth: Normal oral mucosa. Excellent dentition  Neck:Jugular venous pressure ~12cm  Chest:  Lungs clear bilaterally   Heart: Single S1 with no  systolic murmurs or rubs. No diastolic murmurs or gallops.  Abdomen: Not distended, tympanic to percussion, nontender, liver not enlarged, no ascites noted.  Extremities: No edema, mild varicosity on inner thighs and lower right leg. Resolving bruise at right upper thigh  Neurologic: Alert and oriented x 4.  Psychologic: Normal mood and affect.      Cardiac Diagnostic Data:  Echocardiogram 08/09/20- reviewed by me:1. 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   2. 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 Low normal systemic RV systolic function.   3. Large secundum ASD. Status post extracardiac Fontan. Visualized portions of the Fontan appear patent, no obvious fenestration noted. Sherrine Maples shunt appears normal.   4. L-transposition of the great arteries.   5. Large secundum ASD. Status post extracardiac Fontan. Visualized portions of the Fontan appear patent, no obvious fenestration noted. Sherrine Maples shunt appears widely patent with low velocity flow noted.   6. A prior echo performed on 07/29/2019 was reviewed for comparison. No significant changes noted since the previous study. Pacemaker remote transmission 05/07/20:  4.9 years longevity, VVIR mode, device function normal, one episode of possible NSVT (3 beats) on 6/12 after walking 5K, asymptomatic, 99% V pacing.      Echocardiogram 07/29/2019:  2D AND M-MODE MEASUREMENTS (normal ranges within parentheses): Aorta/Left Atrium: Aortic Root, d (2D): 3.0 cm Aortic Valve: AoV Max Vel: 0.84 m/s AoV Peak PG: 3 mmHg AoV Mean PG: Tricuspid Valve and PA/RV Systolic Pressure: RA Pressure: 8 mmHg Aorta: Ao Asc: 2.4 cm Ao Arch: 2.2 cm Ao Desc: 1.7 cm   PHYSICIAN INTERPRETATION: CONOTRUNCAL ANATOMY: The cardiac structural malformations are consistent with a diagnosis of levo-transposition of the great arteries. LEFT VENTRICLE: Patient demonstrated normal sinus rhythm during echocardiogram. 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 RIGHT VENTRICLE: Double out right ventricle with hypoplastic LV and large VSD with '' functional single ventricle'' Right ventricular hypertrophy. Low normal systemic RV systolic function. RIGHT ATRIUM: Right atrial pressure is estimated at 8 mmHg. Right atrial pressure is estimated at 8 mmHg. Large secundum ASD. Status post extracardiac Fontan. Visualized portions of the Fontan appear patent, no obvious fenestration noted. Sherrine Maples shunt appears normal. MITRAL VALVE: No evidence of mitral valve regurgitation. TRICUSPID VALVE: Trace tricuspid valve regurgitation. AORTIC VALVE: The aortic valve was not well visualized. Aorta is anterior of the pulmonic valve. No evidence of aortic valve regurgitation. PULMONIC VALVE: The pulmonic valve was not well visualized. AORTA: No coarctation of the aorta. PULMONARY ARTERY: The pulmonary artery is not well seen. SYSTEMIC VEINS: The inferior vena cava is normal in size and exhibits less than 50% respiratory change. PERICARDIUM: There is no evidence of pericardial effusion. CONCLUSIONS :  1. L-transposition of the great arteries.  2. 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 Low normal systemic RV systolic function.  3. Double outlet right ventricle-doubly commited ventricular septal defect-aorta anterior to pulmonary artery-no left ventricular outflow tract obstruction.  4. 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  5. Large secundum ASD. Status post extracardiac Fontan. Visualized portions of the Fontan appear patent, no obvious fenestration noted. Sherrine Maples shunt appears normal.  6. A prior echo performed on 05/13/2018 was reviewed for comparison. No significant changes noted since the previous study.    CAM  Monitor 04/12/17:  Predominant rhythm: Paced  ??? Paced rhythm  ??? Ventricular Ectopy = (<1%)  1802 isolated, unifocal PVCs  and 208 pairs  ??? Atrial Ectopy = (<1%)  1 isolated PAC  Predominantly V paced rhythm with rare supraventricular beats  (most are labeled PVC's). Symptoms of ''Chest discomfort'  correlated with 2 narrow complex beats/. Symptoms of  Dizziness/Lightheadedness correlated with ventricular pacing.  One of four button presses correlated with a narrow complex  beat, the remaining button presses correlated with ventricular  pacing.      Echocardiogram:  05/13/18  PHYSICIAN INTERPRETATION:  CONOTRUNCAL ANATOMY: The cardiac structural malformations are consistent with a diagnosis of levo-transposition of the great arteries.  LEFT VENTRICLE: Patient demonstrated normal sinus rhythm during echocardiogram. 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  RIGHT VENTRICLE: Double out right ventricle with hypoplastic LV and large VSD with '' functional single ventricle'' Right ventricular hypertrophy. Low normal systemic RV systolic function.  LEFT ATRIUM:  RIGHT ATRIUM: Right atrial pressure is estimated at 8 mmHg. Right atrial pressure is estimated at 8 mmHg. Large secundum ASD. Status post extracardiac Fontan. Visualized portions of the Fontan appear patent, no obvious fenestration noted. Sherrine Maples shunt   appears normal.  MITRAL VALVE: No evidence of mitral valve regurgitation.  TRICUSPID VALVE: Trace tricuspid valve regurgitation.  AORTIC VALVE: The aortic valve was not well visualized. Aortic valve area was not quantified by continuity equation on this study. Aorta is anterior of the pulmonic valve.  No evidence of aortic regurgitation.  PULMONIC VALVE: No indication of pulmonic valve regurgitation. Pulmonic is posterior of the aorta.  AORTA: Normal aortic arch, normal descending aorta, normal mid ascending aorta and the aortic root is normal in size and structure. No coarctation of the aorta.  PULMONARY ARTERY: The pulmonary artery is not well seen.  SYSTEMIC VEINS: The inferior vena cava is normal in size and exhibits less than 50% respiratory change.  PERICARDIUM: There is no evidence of pericardial effusion.  CONCLUSIONS:   1. L-transposition of the great arteries.   2. 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 Low normal systemic RV systolic function.   3. Double outlet right ventricle-doubly commited ventricular septal defect-aorta anterior to pulmonary artery-no left ventricular outflow tract obstruction.   4. 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   5. Large secundum ASD. Status post extracardiac Fontan. Visualized portions of the Fontan appear patent, no obvious fenestration noted. Sherrine Maples shunt appears normal.   6. A prior echo performed on 05/07/2017 was reviewed for comparison. No significant changes noted since the previous study.    Stress Echo/CPX:  05/13/18  BASELINE ECG:   Ventricular paced with biphasic T wave in lead V2  PROTOCOL: Bruce. (Treadmill advanced to stage 3 on its own during stage 2 of exercise after 1')  CONTROL:   HR:  72    BP: 110/64     O2 Sat:  98% (forehead) 92% (finger)   START:  1.7 MPH at 10% grade.   STOP: 11.4 METS with 3.4 mph at 14% grade x 2???37 after 7???37'' total exercise time due to shortness of breath.  Peak HR: 129      Peak BP: 128/66         Peak O2 Sat:  94%   Peak Double-Product: 16,512    Maximum VO2 = 1391 ml which is 77% of predicted, maximum VO2/kg = 23.7 ml/kg which is 85% of predicted (Note: The reference values are adjusted for weight and modality of exercise)  VE/VO2 =  40   VE/VCO2 = 36  VE/VCO2 slope = 30.1  VO2/HR =11.1 which is 111% predicted  Breathing Reserve = 41  RQ= 1.12  Anaerobic Threshold was reached at HR = 103 VO2 =  1144 ml     VO2/kg =  19.5 ml/kg  RESULTS:   Symptoms:  Shortness of breath and leg fatigue, denied chest pain  ST-T Changes: Uninterruptable in the presence of ventricular pacing.  Dysrhythmias:  Rare PVCs during exercise  BP Response:  Blunted.  IMPRESSIONS: Fair exercise tolerance achieving 71% maximum predicted heart rate, limited by shortness of breath. ST changes during exercise are uninterpretable in the presence of ventricular pacing. No chest pain. Rare PVCs during exercise. Blunted BP at a low double product. Maximum VO2/kg = 23.7 ml/kg which is 85% of predicted. VE/VCO2 slope = 30.1 which is 119% of predicted. Compared with previous stress/CPX of 06/21/15 exercise tolerance has decreased from 12.7 METS, MVO2/kg had been 24.8, VE/VCO2 slope had been 23.1.  BASELINE:  Baseline: Single ventricle with low normal systolic function.  ADDITIONAL BASELINE FINDINGS:  LEFT VENTRICLE: Global left ventricular systolic function is mildly decreased (LVEF 40-49%).  MITRAL VALVE: Trace mitral valve regurgitation.  TRICUSPID VALVE: Trace tricuspid valve regurgitation.     Global left ventricular function increased appropriately with stress. No new segmental wall motion abnormalities were seen. Global left ventricular systolic function at peak stress is lower limits of normal (LVEF 50-55%). Mild mitral valve regurgitation.   Mild tricuspid regurgitation. Post-exercise: mild increase in single ventricle contractility.     SUMMARY:   1. Please see separately resulted cardiopulmonary exercise test for details of exercise performance.   2. DORV, L-TGA, hypoplastic LV, large VSD, large ASD.   3. Baseline: Single ventricle with low normal systolic function.   4. Post-exercise: mild increase in single ventricle contractility    Liver US:  05/13/18  IMPRESSION:  1. Irregular liver contour and coarsened in echotexture representing underlying chronic liver disease/fibrosis. No focal mass. Cholelithiasis. Splenomegaly supports portal hypertension. Normal liver Doppler.  2. Shear wave liver stiffness measurement indicating moderate to severe fibrosis (MetaVir stages F2-F3). Note, the morphologic appearance of the liver appears more fibrotic than Elastography measurements indicate.    Cardiac Cath:  08/08/17:      Liver US  07/01/17 (outside report) shows no lesions, Metavir score of 4.    Echocardiogram:  05/07/17 PHYSICIAN INTERPRETATION:  CONOTRUNCAL ANATOMY: The cardiac structural malformations are consistent with a diagnosis of levo-transposition of the great arteries.  LEFT VENTRICLE: Visually estimated left ventricular ejection fraction 55-60%. Patient demonstrated normal sinus rhythm during echocardiogram. 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  RIGHT VENTRICLE: Double out right ventricle with hypoplastic LV and large VSD with '' functional single ventricle'' Right ventricular hypertrophy. Low normal systemic RV systolic function.  RIGHT ATRIUM: Right atrial pressure is estimated at 8 mmHg. Right atrial pressure is estimated at 8 mmHg. Large secundum ASD. Status post extracardiac Fontan. Visualized portions of the Fontan appear patent, no obvious fenestration noted. Sherrine Maples shunt   appears normal.  MITRAL VALVE: No evidence of mitral valve regurgitation.  TRICUSPID VALVE: Mild tricuspid valve regurgitation.  AORTIC VALVE: The aortic valve was not well visualized. Aorta is anterior of the pulmonic valve.  No evidence of aortic regurgitation. PULMONIC VALVE: No indication of pulmonic valve regurgitation. Pulmonic is posterior of the aorta.  AORTA: Normal aortic arch and normal descending aorta. The ascending aorta was not well visualized. Mildly enlarged aortic root (4.2 cm). No coarctation of the aorta.  PULMONARY ARTERY: The pulmonary  artery is not well seen.  SYSTEMIC VEINS: The inferior vena cava is normal in size and exhibits less than 50% respiratory change.  PERICARDIUM: There is no evidence of pericardial effusion.  CONCLUSIONS:   1. L-transposition of the great arteries.   2. Double outlet right ventricle-doubly commited ventricular septal defect-aorta anterior to pulmonary artery-no left ventricular outflow tract obstruction.   3. 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 Low normal systemic RV systolic function.   4. Large secundum ASD. Status post extracardiac Fontan. Visualized portions of the Fontan appear patent, no obvious fenestration noted. Sherrine Maples shunt appears normal.   5. Left ventricular ejection fraction is approximately 55-60%.   6. Mildly enlarged aortic root (4.2 cm).   7. Mild tricuspid valve regurgitation.   8. A prior echo performed on 03/09/2016 was reviewed for comparison. No significant changes noted since the previous study.   9. 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     ECG 11/07/16: Ventricular paced. Ventricular rate of 75 bpn, QRS duration 184 ms. QT/QTc 498/556 ms    Liver Ultrasound 07/27/16 per report from OSH: Liver 17.5 cm in length. It is normal in echodensity. Liver contour is smooth. No focal lesion os identified. Hepatopedal flow is identifies in the main, right, and left portal veins. Gallbladder and biliary tree: There are several small mobile gallstones. There is no focal tenderness. The gallbladder wall is thickened measuring 0.7 cm. There is no pericholecystic fluid. No intra or extrahepatic biliary ductal dilatation.      Echocardiogram 03/09/16: CONOTRUNCAL ANATOMY: The cardiac structural malformations are consistent with a diagnosis of levo-transposition of the great arteries. The observed structural malformations are consistent with the diagnosis of double outlet right ventricle with doubly???commited ventricular septal defect. The aorta is anterior to the pulmonary artery. There is no sub-aortic obstruction of left ventricular outflow tract. LEFT VENTRICLE: Normal left ventricular size. Visually estimated left ventricular ejection fraction 55-60%. Small left ventricular size. RIGHT VENTRICLE: (DTI 6.0 cm/s). 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 Low normal systemic RV systolic function. LEFT ATRIUM: Mild left atrial enlargement. RIGHT ATRIUM: Right atrial pressure is estimated at 8 mmHg. Large secundum ASD. Status post extracardiac Fontan. Visualized portions of the Fontan appear patent, no obvious fenestration noted. Sherrine Maples shunt appears normal. MITRAL VALVE: No evidence of mitral valve regurgitation. TRICUSPID VALVE: Mild tricuspid valve regurgitation. AORTIC VALVE: The aortic valve is trileaflet and structurally normal, with normal leaflet excursion. Aorta is anterior of the pulmonic valve. No evidence of aortic regurgitation. PULMONIC VALVE: No indication of pulmonic valve regurgitation. Pulmonic is posterior of the aorta. AORTA: Normal mid ascending aorta and normal aortic arch. Mildly enlarged aortic root (3.9 cm). SYSTEMIC VEINS: The inferior vena cava is normal in size and exhibits less than 50% respiratory change. PERICARDIUM: There is no evidence of pericardial effusion. ?????? CONCLUSIONS: ???1. 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 Low normal systemic RV systolic function. ???2. Left ventricular ejection fraction is approximately 55-60%. ???3. Double outlet right ventricle. ???4. Double outlet right ventricle ????????? -doubly commited ventricular septal defect ????????? -aorta anterior to pulmonary artery ????????? -no left ventricular outflow tract obstruction. ???5. L-transposition of the great arteries. ???6. Mild left atrial enlargement. ???7. Large secundum ASD. Status post extracardiac Fontan. Visualized portions of the Fontan appear patent, no obvious fenestration noted.  Sherrine Maples shunt appears normal. ???8. Mild tricuspid valve regurgitation. ???9. Mildly enlarged aortic root (3.9 cm).    Abdominal US 06/21/15:  The pancreas is partially visualized and grossly unremarkable. The liver is mildly heterogeneous in echogenicity. There is no focal liver lesion. Portal vein demonstrates hepatopetal flow. No intra- or extrahepatic biliary dilation. The common bile duct is normal in caliber. The gallbladder is normally distended, containing gallstones. No gallbladder wall thickening or pericholecystic fluid. No sonographic Murphy's sign. The spleen is mildly enlarged. The kidneys are normal in size. Both kidneys demonstrate normal cortical thickness and echogenicity. No hydronephrosis. There is no ascites. The visualized proximal aorta and IVC are unremarkable. MEASUREMENTS: Liver: 15.1 cm Common Duct: 3 mm Right Kidney: 10.5 cm Left Kidney: 11.5 cm Spleen: 17 cm Aorta: 1.1 cm SHEAR WAVE LIVER STIFFNESS MEASUREMENTS: Mean 1.63 +/-0.12 m/sec, Median 1.66 m/sec, equating to 5.7-12 kPA. REFERENCE (m/s, kPa): Normal: 0.81-1.22 m/s 2.0-4.5 kPa (METAVIR F0) Normal/mild: 1.22-1.37 m/s 4.5-5.7 kPa (METAVIR F0-F1) Mild/moderate: 1.37-2 m/s 5.7-12.0 kPa (METAVIR F2-F3) Moderate/severe: 2-2.64 m/s 12.0-21.0 kPa (METAVIR F3-F4) Severe: >2.64 m/s >21.0 kPa (METAVIR F4) IMPRESSION: 1. Heterogenous liver echogenicity, suggesting diffuse liver disease. No suspicious liver lesions. Patent hepatic vasculature. 2. Shear wave liver stiffness measurement indicating mild/moderate liver fibrosis, MetaVir score of F2-3. 3. Splenomegaly. 4. Cholelithiasis without evidence of acute cholecystitis.    Echocardiogram 06/21/15:  2D AND M-MODE MEASUREMENTS (normal ranges within parentheses): Aorta/Left Atrium: Aortic Root, d (2D): 2.8 cm LV DIASTOLIC FUNCTION: MV Peak E: 1.61 m/s E/e' Ratio: 40.2 LV IVRT: 134 msec Decel Time: 229 msec Right Ventricle: TAPSE: 1.0 cm Aortic Valve: AoV Max Vel: 1.05 m/s AoV Peak PG: 4 mmHg AoV Mean PG: Tricuspid Valve and PA/RV Systolic Pressure: TR Max Velocity: 3.2 m/s RA Pressure: 8 mmHg RVSP/PASP: 49 mmHg Pulmonic Valve: PV Max Velocity: 0.8 m/s PV Max PG: 2 mmHg PV Mean PG: Aorta: Ao Asc: 2.4 cm. PHYSICIAN INTERPRETATION: CONOTRUNCAL ANATOMY: The cardiac structural malformations are consistent with a diagnosis of levo-transposition of the great arteries. 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 The aorta is anterior to the pulmonary artery. There is no sub-aortic obstruction of left ventricular outflow tract. LEFT VENTRICLE: Visually estimated left ventricular ejection fraction 55-60%. Small left ventricular size. RIGHT VENTRICLE: TAPSE 1.0 cm, (DTI 6.0 cm/s). 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 Low normal systemic RV systolic function. RIGHT ATRIUM: Right atrial pressure is estimated at 8 mmHg. Large secundum ASD. Status post extracardiac Fontan. Visualized portions of the Fontan appear patent, no obvious fenestration noted. Unable to obtain images of Glenn shunt. MITRAL VALVE: No evidence of mitral valve regurgitation. TRICUSPID VALVE: Mild tricuspid valve regurgitation. Tricuspid regurgitation velocity is not well seen. AORTIC VALVE: The aortic valve is trileaflet and structurally normal, with normal leaflet excursion. Aorta is anterior of the pulmonic valve. No evidence of aortic regurgitation. PULMONIC VALVE: No indication of pulmonic valve regurgitation. Pulmonic is posterior of the aorta. AORTA: The aortic root is normal in size and structure, normal mid ascending aorta and normal aortic arch. SYSTEMIC VEINS: The inferior vena cava is not well visualized. PERICARDIUM: There is no evidence of pericardial effusion. CONCLUSIONS: 1. Small left ventricular size 2. 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 3. L-transposition of the great arteries. 4. Mild tricuspid valve regurgitation.  5. 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 Low normal systemic RV systolic function.     CPEX/Echo 06/11/15:  BASELINE: Low normal systemic RV systolic function at rest. Hypoplastic LV. ADDITIONAL BASELINE FINDINGS: MITRAL VALVE: Trace mitral valve regurgitation. TRICUSPID VALVE: Trace tricuspid valve regurgitation. No new segmental wall motion abnormalities were seen. Global left ventricular systolic function at peak stress is normal (LVEF 60-65%). Improved systemic RV systolic function with stress. No increase in degree of atrioventricular valve regurgitation with exercise. SUMMARY: 1. Improved systemic RV systolic function with stress. No increase in degree of atrioventricular valve regurgitation with exercise. 2. Low normal systemic RV systolic function at rest. Hypoplastic LV. BASELINE ECG: Ventricular paced rhythm PROTOCOL: Bruce. CONTROL: HR: 74 BP: 120/74 O2 Sat: 95 % START: 1.7 MPH at 10% grade. STOP: 12.7 METS with 4.2 mph at 16 % grade x 1???33'' after 10???33'' total exercise time due to shortness of breath. Peak HR: 141 Peak BP: 136/56 Peak O2 Sat: 95 % Peak Double-Product: 19,176 Maximum VO2 = 1386 ml which is 77 % of predicted, maximum VO2/kg = 24.8 ml/kg which is 88 % of predicted (Note: The reference values are adjusted for weight and modality of exercise) VE/VO2 = 35 VE/VCO2 = 31 VE/VCO2 slope = 23.1 VO2/HR = 9.8 which is 99 % predicted Breathing Reserve = 50 RQ= 1.13 Anaerobic Threshold was reached at HR = 103 VO2 = 1094 ml VO2/kg = 19.6 ml/kg RESULTS: Symptoms: Shortness of breath and leg fatigue; no chest pain or discomfort. ST-T Changes: Uninterpretable due to paced rhythm. Dysrhythmias: Rare PVC during exercise BP Response: Appropriate. IMPRESSIONS: Good exercise tolerance achieving 77 % maximum predicted heart rate, limited by shortness of breath. Exercise induced ST changes are uninterpretable due to paced rhythm. No exercise induced chest pain at a low double product. Rare PVCs as described above. Maximum VO2 = 1386 ml which is 77 % of predicted, maximum VO2/kg = 24.8 ml/kg which is 88 % of predicted . VE/VCO2 slope = 23.1 which is 92 % of predicted. Compared with previous stress/CPX of 04/24/13 maximum VO2/kg had been 25.0 at 11.9 METS    Echocardiogram:  04/21/13: CONCLUSIONS:  1. Double outlet right ventricle.  2. Moderately enlarged right ventricular size and low normal systolic function. 3. Moderately increased RV wall thickness. 4. Mild tricuspid regurgitation. 5. Status post extracardiac Fontan, appears widely patent, no fenestration noted. Sherrine Maples shunt not well visualized.    Labs:      03/22/20- outside labs-  Protein 7.9, alb 4.4    Component      Latest Ref Rng & Units 07/29/2019   Sodium      135 - 146 mmol/L 136   Potassium      3.6 - 5.3 mmol/L 3.9   Chloride      96 - 106 mmol/L 97   Total CO2      20 - 30 mmol/L 22   Anion Gap      8 - 19 mmol/L 17   Glucose      65 - 99 mg/dL 70   GFR Est.for Non-African Americ      See GFR Additional Information mL/min/1.61m2 89   GFR Est.for African American      See GFR Additional Information mL/min/1.23m2 >89   GFR Additional Information       See Comment   Creatinine      0.60 - 1.30 mg/dL 7.82   Urea Nitrogen  7 - 22 mg/dL 16   Calcium      8.6 - 10.4 mg/dL 9.7   TOTAL PROTEIN      6.1 - 8.2 g/dL 8.3 (H)   Albumin      3.9 - 5.0 g/dL 5.3 (H)   Bilirubin,Total      0.1 - 1.2 mg/dL 1.0   Alkaline Phosphatase      37 - 113 U/L 69   AST (SGOT)      13 - 47 U/L 26   ALT (SGPT)      8 - 64 U/L 24   White Blood Cell Count      4.16 - 9.95 x10E3/uL 4.46   Red Blood Cell Count      3.96 - 5.09 x10E6/uL 5.13 (H)   Hemoglobin      11.6 - 15.2 g/dL 45.4   Hematocrit      34.9 - 45.2 % 45.7 (H)   Mean Corpuscular Volume      79.3 - 98.6 fL 89.1   Mean Corpuscular Hemoglobin      26.4 - 33.4 pg 29.4   MCH Concentration      31.5 - 35.5 g/dL 09.8   Red Cell Distribution Width-SD      36.9 - 48.3 fL 44.5   Red Cell Distribution Width-CV      11.1 - 15.5 % 13.6   Platelet Count, Auto      143 - 398 x10E3/uL 124 (L)   Mean Platelet Volume      9.3 - 13.0 fL 12.1   Nucleated RBC%, automated      No Ref. Range % 0.0   Absolute Nucleated RBC Count      0.00 - 0.00 x10E3/uL 0.00   Prothrombin Time      11.5 - 14.4 seconds 14.7 (H)   INR      . 1.2   TSH      0.3 - 4.7 mcIU/mL 3.7   GGT      7 - 68 U/L 129 (H)   AFP      0 - 6.7 ng/mL 2.9   BNP      <100 pg/mL 134 (H)     08/18/18: WBC 5.8 RBC 5.11 Hgb 15 Hct 44.5 Plt 89 NA 139 K 5.3 BUN 16 Creat 0.81 Gluc 71 BNP 149 TSH 2.39  08/08/17: Creat 0.72, BUN 12, K 3.8  07/01/17:  Chol 121, trig 58, HDL 40, LDL 69, Hgb A1c 5.4, INR 1.2, prealbumin 24, TSH 3.6, Hct 43.2, Plat 82k  07/26/16: WBC 3.0, HGB 4.69, HCT 40.7, PLT 75, NA 137, K 3.8, BUN 13, CREAT 0.7, ALK PHOS 57, AST 30, ALT 38, TBil 1.3, GGT 100, PREALB 24, BNP 113, INR 1.2, TSH 4.24, FT3 3.6, FT4 1.05.  04/01/13:  Hgb 14.7, Hct 42.4, MCV 85.1, plat ct 106k, Na 143, K 4.2, creat 0.8, BUN 19, AST 36, ALT 32    Impressions:  1. DORV (although recent echos appear to show a DILV with single LVEF 50%, and earlier surgical notes describe her anatomy as DILV), L-malposed great arteries: s/p RA-PA anastomosis Fontan at age 75 years, now s/p Extracardiac Fontan and bidirectional Sherrine Maples shunt on 11/21/07 by Dr. Richardo Hanks, including bi-atrial Maze procedure and placement of a permanent atrial pacing lead.  2. Postoperative junctional rhythm and bradycardia: re-admitted 4 days after discharge with large right pericardial effusion requiring thoracentesis and placement of an abdominal pacemaker generator and epicardial ventricular lead on 12/05/07 (  non-functioning atrial lead).  3. Ascites, improved on aldactone. No evidence of ascites on annual abdominal US.  4. Longstanding history of irregular and heavy menses, thus far unsuccessfully regulated on several types of hormonal therapy. Better controlled on Mirena since 2012.  Also has cervical HPV.  Now with CIN III, underwent LEEP procedure in Jan 2018  5. History of atrial fibrillation, but no post Fontan revision tachyarrhythmias thus far, formerly on coumadin (possible side effect of hair loss), now on ASA.  6. Longstanding elevated left hemidiapragm, s/p successful plication on 03/02/2009 by Dr. Larwance Sachs.  7. Liver US in 2014, 2016 and 2017, and 2018 shows no lesions, but 2017 and 2018 outside liver US report Metavir score of 4, which is consistent with cirrhosis.  2019 liver US at [facility] showed MetaVir stage F2-F3, although elastography impacted by congestive hepatopathy.   8. Cardiac cath and liver biopsy on 08/08/17, with mean Fontan pressure under sedation of , and liver biopsy showing extensive bridging fibrosis but no cirrhosis.  Spirinolactone was increased from 25 to 50mg  to 100mg  after this cath  9. Ventricular pacemaker, pacing 99% of time.   Generator replaced on 12/12/17 by Dr. Larwance Sachs. Deferred placement of transvenous atrial lead via her LPA to minimize V pacing, based on Bellamarie's wish not to be committed to anticoagulation  10. Stable exercise capacity in July 2019, with MVO2 23.7 (85% predicted), previously 24.8 and 88% predicted in 2016    [facility]    Labs (BMP, BNP, LFTs, GGT, AFP, INR, CBC, Prealbumin):  []  Within last year (date: 07/29/2019 )  [x]  Ordered today  []  Deferred:      Hep C screening  [x]  Prior HCV Ab negative (date: 06/21/15)  []  Not indicated (no surgery prior to early 1990s)  []  Ordered    Last hemodynamic cath:   [x]  Within last 10 years (date: 08/08/17), see above results (prior to Fontan revision)  []  Deferred. Reason:   []  Scheduled    Last Liver biopsy  [x]  Within last 10 years (date:08/08/17), see above results  []  Scheduled with cath    Liver Imaging:  []  MRI Abdomen within last 5 years (date: ), see above  []  Triple phase CT within last 5 years (date: ), see above  []  Liver ultrasound completed (date: 05/13/18), see above  [x]  Ordered today, see above.     Advanced cardiac imaging:  []  Cardiac MRI (date: ), see above  [x]  Cardiac CT (date: 12/12/2007), see above  [x]  Deferred. Reason: Pacemaker  []  Ordered today, see above     PDE-5 Inhibitor recommended:  [x]  On sildenafil []  On tadalafil  []  Not tolerated. Previously on []  sildenafil (Year: )  []  tadalafil (Year: )   Side effect:  []  Deferred. Reason:   []  Ordered today, see above    Echo  [x]  Within last year (date: 08/09/20), see above results  []  Deferred. Reason:   []  Ordered today    Cardiopulmonary exercise test  [x]  Within last 3 years (date: 05/13/18), see above results  []  Deferred. Reason:   []  Ordered today    Advanced care planning  [x]  Date: Advanced directive in CC from 12/27/2010  []  Deferred. Reason:       Recommendations:   1. We reviewed the results of today's echocardiogram, which is stable.   2. Regarding the abdominal symptoms, she has not had an abdominal US since 2019 and needs one done, she one ordered at [facility] but she has a  flight back to [place] that day so she wants to consider having this done in [place].    3. I have advised her to restart pantoprazole daily given GERD symptoms and voice hoarseness which may be related to GERD.  Additionally she has chronically been on ASA therefore this could be contributing to her abdominal discomfort.  We may need to stop ASA and try another antiplatelet or anticoagulant but first will have her start pantoprazole.    4. Will see what the abdominal US shows, previously she has not had ascites and she takes her diuretics diligently and tries to watch her salt intake.  She is also trying to limit free water intake.  5. Continue furosemide 80 daily, spirinolactone 100mg ,  and sildenafil 20mg  TID. We spent time today reviewing the rationale for diuretic and PDE-5 Inhibitors in Fontan patients.   6. Check updated labs via [facility], will send order today, including updated liver labs.  I explained that we recommend an abdominal US be done every 6 months and AFP be checked every 6 months per [facility] Fontan survivorship protocol.  Also, recommend that we have LFTs, CMP, CBC, GGT, AFP, BNP yearly.  7. Continue regular exercise as tolerated, as well as diaphragmatic conditioning exercises with inspirometer.  Recommend that she increase her conditioning by singing, yoga and using an inspirometer.   8. Continue regular pacemaker surveillance with Dr. Carollee Herter. She is aware of the issues with her current pacing system (single site pacing) and we have previously extensively reviewed the options for atrial pacing, including using transvenous leads in her LPA, with screw in lead to roof of atrium, but this would require anticoagulation longterm.  She was reluctant to consider this, since she bruises readily with ASA.  She understands that ultimately this may be required to prevent degeneration of ventricular function from single site pacing.    9. Regular dental care with biannual dental cleanings.   10. I have recommended that she establish care with Dr Whitney Post and the [facility] program, we will contact their team and send over records.  11. Recommend that she have a telehealth FU with me in 3-4 months, she will need an updated CPET echo next year in addition to the above.         Copy to:    Dr Whitney Post  Director, Adult Congenital Heart Program  [facility]  [address] [place], [address]  [place], [place] [address]  Fax [phone]

## 2020-08-13 ENCOUNTER — Telehealth: Payer: BLUE CROSS/BLUE SHIELD

## 2020-08-13 NOTE — Telephone Encounter
Patient sent email via outlook--communicating with her thru that.

## 2020-08-15 ENCOUNTER — Ambulatory Visit: Payer: BLUE CROSS/BLUE SHIELD | Attending: Cardiovascular Disease

## 2020-08-15 NOTE — Telephone Encounter
Patient would like to establish care with Dr. Whitney Post  at [facility].  Dr. Tobie Poet as emailed Dr. Adriana Simas   Introducing patient, records have been faxed, and echo CD mailed. Records are also available thru Epic/CAre Everywhere.

## 2020-08-16 ENCOUNTER — Ambulatory Visit: Payer: BLUE CROSS/BLUE SHIELD

## 2020-08-16 ENCOUNTER — Telehealth: Payer: BLUE CROSS/BLUE SHIELD

## 2020-08-16 DIAGNOSIS — Q204 Double inlet ventricle: Secondary | ICD-10-CM

## 2020-08-16 DIAGNOSIS — Q208 Other congenital malformations of cardiac chambers and connections: Secondary | ICD-10-CM

## 2020-08-16 NOTE — Addendum Note
Addended by: Perl Kerney [facility] on: 08/16/2020 01:12 PM     Modules accepted: Orders

## 2020-08-16 NOTE — Telephone Encounter
Call Back Request      Reason for call back: Patient would like her lab orders sent to [facility] center as she states that is the closest facility to her that can draw her labs. She did not have their fax number but provided their phone number as [phone]. She states she also emailed [place] regarding this and would like a call back from her.    Any Symptoms:  []  Yes  [x]  No      ? If yes, what symptoms are you experiencing:    o Duration of symptoms (how long):    o Have you taken medication for symptoms (OTC or Rx):      Patient or caller has been notified of the 24-48 hour turnaround time.

## 2020-08-16 NOTE — Telephone Encounter
I let pt know I was unable to get lab fax # - she will send [place] fax # via my chart.

## 2020-08-22 ENCOUNTER — Telehealth: Payer: PRIVATE HEALTH INSURANCE

## 2020-08-22 NOTE — Telephone Encounter
I left message w/[facility] nurse #.

## 2020-08-22 NOTE — Telephone Encounter
I spoke to pt - reviewed labs

## 2020-09-01 NOTE — Procedures
SEE MEDIA SECTION FOR PROGRAMMER PRINTOUT   CAR EP Device Clinic Pacemaker  ???  PATIENT: Ann Duran  MRN: [id number]  DOB: 11/14/1975  ???  DATE OF PROCEDURE: 08/09/2020  TIME OF PROCEDURE: 1408  ???  INDICATIONS:???Annual pacemaker evaluation   PROCEDURE:???Pacemaker evaluation  ???  Location: [facility] [place] [facility]  ???  Technician: Shanon Payor, RN  ???  READING PHYSICIAN:??????Dr. Charlesetta Garibaldi  ???  Device:  Device Manufacturer: Medtronic  Device Model Name: Jamse Mead DR MRI  Model Number: [id number]  Device Serial Number: [id number]  Device Implant Date: 12/12/2017  Indication for Implant:   ???  RA lead:   Manufacturer: Medtronic   Model Number: [id number]  Serial Number: [id number]  Date of Implant: 12/05/2007  ???  RV lead:  Manufacturer: Medtronic   Model Number: [id number]  Serial Number: [id number]  Date of Implant: 12/05/2007  ???  Battery: 4.5 years  ???  Underlying Rhythm: Ventricular rate ~56 bpm  R wave: 11.6 mV  ???  Impedance:  A: 399 ohms  V: 551 ohms  ???  Capture threshold  V: 2.5 V @ 0.8 ms  ???  Since 05/13/2018  ???  Tachy episode summary:  VT/VF: 1- 04/16/2020 @ 1926 addressed with 08/06/2020 Carelink transmission  ???  AP: 0%  VP: 99.7%  ???  Programmed changes:   None  ???  Settings:   Mode: VVIR  Rate: Lower/Upper Sensor: 70/150 bpm  RV Output: 3.5 V @ 0.8 ms  RV Sensitivity: 2.0 mV  ???  Assessment  1. 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 S/p Fontan conversion to extracardiac baffle on 11/21/07.  2. Underwent surgical placement of abdominal pacemaker generator on 12/05/07, but atrial lead was non-capturing, left with single site ventricular pacing. Generator change 12/12/2017 by Dr. Larwance Sachs  3. Patient is not pacemaker dependent at this time, underlying ventricular rate ~50 bpm.  4. Normal pacemaker function.  5. One ventricular high rate episode noted from 04/16/2020, addressed with 08/06/2020 Carelink transmission.  ???  Plan:  1. Patient seen today by Dr. Charlesetta Garibaldi, see separate EP note.  2. No programming changes made.  3. Remote monitoring every 3 months.  4. Return for device evaluation and follow up with Dr. Carollee Herter in 1 year, sooner for any symptoms.  ???  Shanon Payor, RN

## 2020-09-21 ENCOUNTER — Ambulatory Visit: Payer: BLUE CROSS/BLUE SHIELD

## 2020-09-21 MED ORDER — PANTOPRAZOLE SODIUM 20 MG PO TBEC
20 mg | ORAL_TABLET | Freq: Every day | ORAL | 1 refills | Status: AC | PRN
Start: 2020-09-21 — End: ?

## 2020-10-10 ENCOUNTER — Ambulatory Visit: Payer: BLUE CROSS/BLUE SHIELD

## 2020-10-10 DIAGNOSIS — I2721 Secondary pulmonary arterial hypertension: Secondary | ICD-10-CM

## 2020-10-10 MED ORDER — REVATIO 20 MG PO TABS
20 mg | ORAL_TABLET | Freq: Three times a day (TID) | ORAL | 3 refills | Status: AC
Start: 2020-10-10 — End: ?

## 2020-10-12 ENCOUNTER — Ambulatory Visit: Payer: BLUE CROSS/BLUE SHIELD

## 2020-11-09 ENCOUNTER — Inpatient Hospital Stay: Payer: BLUE CROSS/BLUE SHIELD | Attending: Pediatric Cardiology

## 2020-11-09 DIAGNOSIS — R001 Bradycardia, unspecified: Secondary | ICD-10-CM

## 2020-11-09 DIAGNOSIS — Z950 Presence of cardiac pacemaker: Secondary | ICD-10-CM

## 2020-11-09 NOTE — Procedures
SEE MEDIA SECTION FOR DOWNLOAD PRINTOUT     Ann Duran is a 44 y.o. female  [id number]  Reading MD:  Dr     A Medtronic Carelink remote monitor was received as a routine download. See Media for full report.

## 2020-11-15 ENCOUNTER — Telehealth: Payer: BLUE CROSS/BLUE SHIELD

## 2020-11-22 ENCOUNTER — Ambulatory Visit: Payer: BLUE CROSS/BLUE SHIELD

## 2020-11-22 MED ORDER — POTASSIUM CHLORIDE ER 10 MEQ PO TBCR
20 meq | ORAL_TABLET | Freq: Two times a day (BID) | ORAL | 3 refills | Status: AC
Start: 2020-11-22 — End: 2020-11-25

## 2020-11-24 ENCOUNTER — Ambulatory Visit: Payer: BLUE CROSS/BLUE SHIELD

## 2020-11-25 MED ORDER — POTASSIUM CHLORIDE ER 10 MEQ PO TBCR
10 meq | ORAL_TABLET | Freq: Every day | ORAL | 3 refills | Status: AC
Start: 2020-11-25 — End: ?

## 2020-11-25 NOTE — Addendum Note
Addended by: Devota Pace on: 11/25/2020 10:48 AM     Modules accepted: Level of Service

## 2021-01-02 ENCOUNTER — Ambulatory Visit: Payer: BLUE CROSS/BLUE SHIELD

## 2021-01-02 DIAGNOSIS — R001 Bradycardia, unspecified: Secondary | ICD-10-CM

## 2021-01-02 DIAGNOSIS — Z950 Presence of cardiac pacemaker: Secondary | ICD-10-CM

## 2021-01-09 ENCOUNTER — Telehealth: Payer: BLUE CROSS/BLUE SHIELD

## 2021-01-09 NOTE — Telephone Encounter
Appointment Accommodation Request      Appointment Type: Video appointment    Reason for sooner request: Patient is calling in and found out recently that she had polyps on her vocal chords and edema and they need to remove them but she wanted to speak to Dr. Tobie Poet prior in regards to her cardiac issues. Next appointment I had was to far out. Thank you  Date/Time Requested (If any):     Last seen by MD: 07/29/19    Any Symptoms:  []  Yes  [x]  No       If yes, what symptoms are you experiencing:   o Duration of symptoms (how long):     Patient or caller was offered an appointment but declined.    Patient or caller was advised to seek emergency services if conditions are urgent or emergent.    Patient has been notified of the 24-48 hour turnaround time.

## 2021-01-10 NOTE — Telephone Encounter
Called and spoke to the patient. It is being recommended she have an ENT procedure. She is requesting a referral from Dr. Tobie Poet and cardiac clearance. Will review and follow up.

## 2021-01-13 ENCOUNTER — Ambulatory Visit: Payer: BLUE CROSS/BLUE SHIELD | Attending: Cardiovascular Disease

## 2021-01-17 ENCOUNTER — Telehealth: Payer: BLUE CROSS/BLUE SHIELD | Attending: Cardiovascular Disease

## 2021-01-17 NOTE — Progress Notes
Patient Consent to Telehealth   The patient agreed to participate in the video visit prior to joining the visit.        [facility]     Date of Visit: 01/17/2021   Reason for Visit: Follow up s/p cardiac catheterization on 08/08/17, in setting of DORV s/p Fontan conversion to extracardiac baffle on 11/21/07, and subsequent placement of epicardial permanent pacemaker.     HPI: Ann Duran is a 45 y.o. woman with the following cardiac history:  1. 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  2. Underwent modified Fontan procedure (patch connection of the IVC/SVC via the right atrium to MPA) at age 68 yrs at the [facility].  3. Postoperative chylothorax [phone], subsequently requiring exploration by a left thoracotomy, with ligation of lymphatic vessel and thoracic duct.  4. Suffered fatigue and exercise intolerance in November 2008, and was found to be in atrial fibrillation.  5. Underwent cardioversion at [facility] by Dr. Charlesetta Garibaldi on 10/14/07, along with cardiac cath which revealed excellent RA/Fontan pressures with a mean of 12-78mmHg.  6. Underwent Fontan conversion on 11/21/07, involving takedown of previous Fontan and placement of an extracardiac Fontan and bidirectional Glenn shunt and placement of epicardial permanent pacing leads. Discharged on 11/29/07  7. Re-admitted on 12/03/07 for thoracentesis of large right pleural effusion, in the setting of bradycardia and junctional rhythm  8. Underwent surgical placement of abdominal pacemaker generator on 12/05/07, but atrial lead was non-capturing, left with single site ventricular pacing.  9. Discharged on 12/12/07, after aggressive diuresis in the setting of significant ascites and perineal edema.  10. Subsequent diuresis over the next 6 weeks with resolution of ascites on moderate dose lasix and moderate dose aldactone.  11. underwent left hemidiaphragm plication by Dr. Larwance Sachs on 03/02/2009, unsuccessful attempt at placement of an atrial lead.  12. PPM rate set at VVI 90 bpm during the hospitalization, reduced to 60 bpm by Dr. Carollee Herter on 03/29/2009.  13. In the setting of poor chronotropic response to exercise, rate response activated by Dr. Carollee Herter in July 2011 with significantly improved exercise ability.  14. Underwent placement of Mirena IUD and D & C by Dr. Ardyth Man Parvatenini in February 2012. No cardiovascular complications.  Mirena IUD replaced with LEEP procedure in January 2018  15. 15. Good functional capacity with MVO2 of 24.8 (88% predicted) in August 2016.  Low normal single RV function, improves with exercise.  No exercise induced arrhythmias  16. 16. V-paced 99% of time at rate of 70, generator nearing ERI as of May 2018 remote check, requiring monthly checks to detect ERI.   17. 17. Liver US in Sept 2017 showed no lesions but METAVIR score was 4 consistent with cirrhosis.  18. 18. Underwent cardiac cath on 08/08/17 by Dr. Tobie Poet, showing mean Fontan pressures of , and liver biopsy showing extensive bridging fibrosis, but no cirrhosis.  Spirinolactone dose was doubled from 25mg  daily to 50mg  daily in an attempt to reduce Fontan volume pressure.  19. 19. Underwent pacemaker generator replacement ([employer]tr[employer]) on 12/12/17 by Dr. Larwance Sachs  20. Moved to [place] for work in early 2020, had issues with the humidity, insomnia and had some exertional decline.    Interval History: Ann Duran presents today for a follow up.  She was seen by Dr Adriana Simas at the [facility] in September of 2021 to establish care.  She had a telehealth visit with their liver specialist in December of 2021.  She is scheduled to have a return appointment in September of 2022.  She was told she had reduced WBC count and has been referred to a hematologist but has not seen them.      She reports having mild URI symptoms starting in January 2021, had one positive COVID test with a follow up test three days later that was negative.  Had her booster shot at around the same time.  Was seen in an ER at the end of February 2022 with uvuleitis- for this she received amoxicillin for a 9 day course and a 5 day course of prednisone- she felt better after 5 days but has had intermittent sore throat since that time.  She was referred to an ENT and underwent laryngoscope which showed polyps and nodules on her vocal chords- the ENT recommended removal.  She came back to [place] and saw her ENT here and he told her that these have been there since 2016 so she decided not to get them removed.  She was told she continues to have evidence of laryngo-pharyngeal inflammation.    She was also told by her OB that they would recommend oral estrogen supplementation for her and she wanted to ask about that before starting treatment.  She also asked about possibly using a stimulant for treatment of ADD which has been suggested to her.    Exercise: Walking, yoga for 20-30 minutes but sporadic in frequency.   Dental: Last seen earlier this year. Does take SBE prophylaxis.       Past Medical History: As above.  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 Darlyn Chamber here at [facility].  LEEP in Jan 2018.  Mirena IUD replaced in Jan 2018.  Nose bleeds s/p bipolar cauterization 10/2016    Allergies: No Known Allergies     Medications:   ???  furosemide 80 mg tablet, Take 1 tablet (80 mg total) by mouth daily.  ???  pantoprazole 20 mg DR tablet, Take 1 tablet (20 mg total) by mouth daily as needed.  ???  potassium chloride 10 MEQ tablet, Take 2 tablets (20 mEq total) by mouth two (2) times daily.  ???  sildenafil 20 mg tablet, TAKE 1 TABLET (20MG ) BY MOUTH THREE TIMES DAILY. GENERIC FOR REVATIO.CALL [phone] TO REFILL.  ???  spironolactone 50 mg tablet, Take 2 tablets (100 mg total) by mouth daily.  ???  alprazolam 0.5 mg tablet, Take 0.25 mg by mouth at bedtime as needed.  ???  Ascorbic Acid (VITAMIN C) 1000 MG tablet, Take 1,000 mg by mouth.  ???  aspirin (ASPIRIN) 81 mg EC tablet, Take 81 mg by mouth daily  ???  cyanocobalamin 1000 mcg tablet, Take 1,000 mcg by mouth as needed for   ???  fluticasone 50 mcg/act nasal spray, instill 1 spray into each nostril once daily  ???  LORazepam 0.5 mg tablet, Take 0.5 mg by mouth as needed for for Sleep.  ???  Omega-3 Fatty Acids (FISH OIL) 500 mg CAPS capsule, Take 500 mg by mouth as needed for.  ???  zolpidem (AMBIEN) 5 mg tablet, Take 2.5 mg by mouth at bedtime as needed for Sleep.      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  Nonsmoker, occas ETOH.    Family History: Mother and father in their 54s in good health. Siblings, she has1 brother who is in good health. There is no  history of congenital heart disease in the family.    ROS: Negative and noncontributory except as noted above in HPI and Interval Events.     Physical Exam:  VS: There were no vitals taken for this visit.     At last in person visit:  General: Pleasant thin woman, in NAD.  Skin: No rashes. Sternotomy and left thoracotomy scars well healed.  Head and Neck: Pupils are equal and reacting.   Mouth: Normal oral mucosa. Excellent dentition  Neck:Jugular venous pressure ~12cm  Chest:  Lungs clear bilaterally   Heart: Single S1 with no systolic murmurs or rubs. No diastolic murmurs or gallops.  Abdomen: Not distended, tympanic to percussion, nontender, liver not enlarged, no ascites noted.  Extremities: No edema, mild varicosity on inner thighs and lower right leg. Resolving bruise at right upper thigh  Neurologic: Alert and oriented x 4.  Psychologic: Normal mood and affect.      Cardiac Diagnostic Data:  Echocardiogram 08/09/20- reviewed by me:1. 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   2. 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 Low normal systemic RV systolic function.   3. Large secundum ASD. Status post extracardiac Fontan. Visualized portions of the Fontan appear patent, no obvious fenestration noted. Sherrine Maples shunt appears normal.   4. L-transposition of the great arteries.   5. Large secundum ASD. Status post extracardiac Fontan. Visualized portions of the Fontan appear patent, no obvious fenestration noted. Sherrine Maples shunt appears widely patent with low velocity flow noted.   6. A prior echo performed on 07/29/2019 was reviewed for comparison. No significant changes noted since the previous study.    Pacemaker remote transmission 05/07/20:  4.9 years longevity, VVIR mode, device function normal, one episode of possible NSVT (3 beats) on 6/12 after walking 5K, asymptomatic, 99% V pacing.      Echocardiogram 07/29/2019:  2D AND M-MODE MEASUREMENTS (normal ranges within parentheses): Aorta/Left Atrium: Aortic Root, d (2D): 3.0 cm Aortic Valve: AoV Max Vel: 0.84 m/s AoV Peak PG: 3 mmHg AoV Mean PG: Tricuspid Valve and PA/RV Systolic Pressure: RA Pressure: 8 mmHg Aorta: Ao Asc: 2.4 cm Ao Arch: 2.2 cm Ao Desc: 1.7 cm   PHYSICIAN INTERPRETATION: CONOTRUNCAL ANATOMY: The cardiac structural malformations are consistent with a diagnosis of levo-transposition of the great arteries. LEFT VENTRICLE: Patient demonstrated normal sinus rhythm during echocardiogram. 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 RIGHT VENTRICLE: Double out right ventricle with hypoplastic LV and large VSD with '' functional single ventricle'' Right ventricular hypertrophy. Low normal systemic RV systolic function. RIGHT ATRIUM: Right atrial pressure is estimated at 8 mmHg. Right atrial pressure is estimated at 8 mmHg. Large secundum ASD. Status post extracardiac Fontan. Visualized portions of the Fontan appear patent, no obvious fenestration noted. Sherrine Maples shunt appears normal. MITRAL VALVE: No evidence of mitral valve regurgitation. TRICUSPID VALVE: Trace tricuspid valve regurgitation. AORTIC VALVE: The aortic valve was not well visualized. Aorta is anterior of the pulmonic valve. No evidence of aortic valve regurgitation. PULMONIC VALVE: The pulmonic valve was not well visualized. AORTA: No coarctation of the aorta. PULMONARY ARTERY: The pulmonary artery is not well seen. SYSTEMIC VEINS: The inferior vena cava is normal in size and exhibits less than 50% respiratory change. PERICARDIUM: There is no evidence of pericardial effusion. CONCLUSIONS :  1. L-transposition of the great arteries.  2. Double out right ventricle with hypoplastic LV and large VSD with '' functional single ventricle'' Right ventricular  hypertrophy. Low normal systemic RV systolic function.  3. Double outlet right ventricle-doubly commited ventricular septal defect-aorta anterior to pulmonary artery-no left ventricular outflow tract obstruction.  4. 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  5. Large secundum ASD. Status post extracardiac Fontan. Visualized portions of the Fontan appear patent, no obvious fenestration noted. Sherrine Maples shunt appears normal.  6. A prior echo performed on 05/13/2018 was reviewed for comparison. No significant changes noted since the previous study.    CAM  Monitor 04/12/17:  Predominant rhythm: Paced  ??? Paced rhythm  ??? Ventricular Ectopy = (<1%)  1802 isolated, unifocal PVCs and 208 pairs  ??? Atrial Ectopy = (<1%)  1 isolated PAC  Predominantly V paced rhythm with rare supraventricular beats  (most are labeled PVC's). Symptoms of ''Chest discomfort'  correlated with 2 narrow complex beats/. Symptoms of  Dizziness/Lightheadedness correlated with ventricular pacing.  One of four button presses correlated with a narrow complex  beat, the remaining button presses correlated with ventricular  pacing.      Echocardiogram:  05/13/18  PHYSICIAN INTERPRETATION:  CONOTRUNCAL ANATOMY: The cardiac structural malformations are consistent with a diagnosis of levo-transposition of the great arteries.  LEFT VENTRICLE: Patient demonstrated normal sinus rhythm during echocardiogram. 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  RIGHT VENTRICLE: Double out right ventricle with hypoplastic LV and large VSD with '' functional single ventricle'' Right ventricular hypertrophy. Low normal systemic RV systolic function.  LEFT ATRIUM:  RIGHT ATRIUM: Right atrial pressure is estimated at 8 mmHg. Right atrial pressure is estimated at 8 mmHg. Large secundum ASD. Status post extracardiac Fontan. Visualized portions of the Fontan appear patent, no obvious fenestration noted. Sherrine Maples shunt   appears normal.  MITRAL VALVE: No evidence of mitral valve regurgitation.  TRICUSPID VALVE: Trace tricuspid valve regurgitation.  AORTIC VALVE: The aortic valve was not well visualized. Aortic valve area was not quantified by continuity equation on this study. Aorta is anterior of the pulmonic valve.  No evidence of aortic regurgitation.  PULMONIC VALVE: No indication of pulmonic valve regurgitation. Pulmonic is posterior of the aorta.  AORTA: Normal aortic arch, normal descending aorta, normal mid ascending aorta and the aortic root is normal in size and structure. No coarctation of the aorta.  PULMONARY ARTERY: The pulmonary artery is not well seen.  SYSTEMIC VEINS: The inferior vena cava is normal in size and exhibits less than 50% respiratory change.  PERICARDIUM: There is no evidence of pericardial effusion.  CONCLUSIONS:   1. L-transposition of the great arteries.   2. 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 Low normal systemic RV systolic function.   3. Double outlet right ventricle-doubly commited ventricular septal defect-aorta anterior to pulmonary artery-no left ventricular outflow tract obstruction.   4. 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   5. Large secundum ASD. Status post extracardiac Fontan. Visualized portions of the Fontan appear patent, no obvious fenestration noted. Sherrine Maples shunt appears normal.   6. A prior echo performed on 05/07/2017 was reviewed for comparison. No significant changes noted since the previous study.    Stress Echo/CPX:  05/13/18  BASELINE ECG:   Ventricular paced with biphasic T wave in lead V2  PROTOCOL: Bruce. (Treadmill advanced to stage 3 on its own during stage 2 of exercise after 1')  CONTROL:   HR:  72    BP: 110/64     O2 Sat:  98% (forehead) 92% (finger)   START:  1.7 MPH at 10% grade.   STOP: 11.4 METS with 3.4 mph at 14% grade x 2???37 after 7???37'' total exercise time due to shortness of breath.  Peak HR: 129      Peak BP: 128/66         Peak O2 Sat:  94%   Peak Double-Product: 16,512    Maximum VO2 = 1391 ml which is 77% of predicted, maximum VO2/kg = 23.7 ml/kg which is 85% of predicted (Note: The reference values are adjusted for weight and modality of exercise)  VE/VO2 =  40   VE/VCO2 = 36      VE/VCO2 slope = 30.1  VO2/HR =11.1 which is 111% predicted  Breathing Reserve = 41  RQ= 1.12  Anaerobic Threshold was reached at HR = 103 VO2 =  1144 ml     VO2/kg =  19.5 ml/kg  RESULTS:   Symptoms:  Shortness of breath and leg fatigue, denied chest pain  ST-T Changes: Uninterruptable in the presence of ventricular pacing.  Dysrhythmias:  Rare PVCs during exercise  BP Response:  Blunted.  IMPRESSIONS: Fair exercise tolerance achieving 71% maximum predicted heart rate, limited by shortness of breath. ST changes during exercise are uninterpretable in the presence of ventricular pacing. No chest pain. Rare PVCs during exercise. Blunted BP at a low double product. Maximum VO2/kg = 23.7 ml/kg which is 85% of predicted. VE/VCO2 slope = 30.1 which is 119% of predicted. Compared with previous stress/CPX of 06/21/15 exercise tolerance has decreased from 12.7 METS, MVO2/kg had been 24.8, VE/VCO2 slope had been 23.1.  BASELINE:  Baseline: Single ventricle with low normal systolic function.  ADDITIONAL BASELINE FINDINGS:  LEFT VENTRICLE: Global left ventricular systolic function is mildly decreased (LVEF 40-49%).  MITRAL VALVE: Trace mitral valve regurgitation.  TRICUSPID VALVE: Trace tricuspid valve regurgitation.     Global left ventricular function increased appropriately with stress. No new segmental wall motion abnormalities were seen. Global left ventricular systolic function at peak stress is lower limits of normal (LVEF 50-55%). Mild mitral valve regurgitation.   Mild tricuspid regurgitation. Post-exercise: mild increase in single ventricle contractility.     SUMMARY:   1. Please see separately resulted cardiopulmonary exercise test for details of exercise performance.   2. DORV, L-TGA, hypoplastic LV, large VSD, large ASD.   3. Baseline: Single ventricle with low normal systolic function.   4. Post-exercise: mild increase in single ventricle contractility    Liver US:  05/13/18  IMPRESSION:  1. Irregular liver contour and coarsened in echotexture representing underlying chronic liver disease/fibrosis. No focal mass. Cholelithiasis. Splenomegaly supports portal hypertension. Normal liver Doppler.  2. Shear wave liver stiffness measurement indicating moderate to severe fibrosis (MetaVir stages F2-F3). Note, the morphologic appearance of the liver appears more fibrotic than Elastography measurements indicate.    Cardiac Cath:  08/08/17:      Liver US  07/01/17 (outside report) shows no lesions, Metavir score of 4.    Echocardiogram:  05/07/17 PHYSICIAN INTERPRETATION:  CONOTRUNCAL ANATOMY: The cardiac structural malformations are consistent with a diagnosis of levo-transposition of the great arteries.  LEFT VENTRICLE: Visually estimated left ventricular ejection fraction 55-60%. Patient demonstrated normal sinus rhythm during echocardiogram. 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  RIGHT VENTRICLE: Double out right ventricle with hypoplastic LV and large VSD with '' functional single ventricle'' Right ventricular hypertrophy. Low normal systemic RV systolic function.  RIGHT ATRIUM: Right atrial pressure is estimated at 8 mmHg. Right atrial pressure is estimated at 8 mmHg. Large  secundum ASD. Status post extracardiac Fontan. Visualized portions of the Fontan appear patent, no obvious fenestration noted. Sherrine Maples shunt   appears normal.  MITRAL VALVE: No evidence of mitral valve regurgitation.  TRICUSPID VALVE: Mild tricuspid valve regurgitation.  AORTIC VALVE: The aortic valve was not well visualized. Aorta is anterior of the pulmonic valve.  No evidence of aortic regurgitation.  PULMONIC VALVE: No indication of pulmonic valve regurgitation. Pulmonic is posterior of the aorta.  AORTA: Normal aortic arch and normal descending aorta. The ascending aorta was not well visualized. Mildly enlarged aortic root (4.2 cm). No coarctation of the aorta.  PULMONARY ARTERY: The pulmonary artery is not well seen.  SYSTEMIC VEINS: The inferior vena cava is normal in size and exhibits less than 50% respiratory change.  PERICARDIUM: There is no evidence of pericardial effusion.  CONCLUSIONS:   1. L-transposition of the great arteries.   2. Double outlet right ventricle-doubly commited ventricular septal defect-aorta anterior to pulmonary artery-no left ventricular outflow tract obstruction.   3. 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 Low normal systemic RV systolic function.   4. Large secundum ASD. Status post extracardiac Fontan. Visualized portions of the Fontan appear patent, no obvious fenestration noted. Sherrine Maples shunt appears normal.   5. Left ventricular ejection fraction is approximately 55-60%.   6. Mildly enlarged aortic root (4.2 cm).   7. Mild tricuspid valve regurgitation.   8. A prior echo performed on 03/09/2016 was reviewed for comparison. No significant changes noted since the previous study.   9. 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     ECG 11/07/16: Ventricular paced. Ventricular rate of 75 bpn, QRS duration 184 ms. QT/QTc 498/556 ms    Liver Ultrasound 07/27/16 per report from OSH: Liver 17.5 cm in length. It is normal in echodensity. Liver contour is smooth. No focal lesion os identified. Hepatopedal flow is identifies in the main, right, and left portal veins. Gallbladder and biliary tree: There are several small mobile gallstones. There is no focal tenderness. The gallbladder wall is thickened measuring 0.7 cm. There is no pericholecystic fluid. No intra or extrahepatic biliary ductal dilatation.      Echocardiogram 03/09/16: CONOTRUNCAL ANATOMY: The cardiac structural malformations are consistent with a diagnosis of levo-transposition of the great arteries. The observed structural malformations are consistent with the diagnosis of double outlet right ventricle with doubly???commited ventricular septal defect. The aorta is anterior to the pulmonary artery. There is no sub-aortic obstruction of left ventricular outflow tract. LEFT VENTRICLE: Normal left ventricular size. Visually estimated left ventricular ejection fraction 55-60%. Small left ventricular size. RIGHT VENTRICLE: (DTI 6.0 cm/s). 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 Low normal systemic RV systolic function. LEFT ATRIUM: Mild left atrial enlargement. RIGHT ATRIUM: Right atrial pressure is estimated at 8 mmHg. Large secundum ASD. Status post extracardiac Fontan. Visualized portions of the Fontan appear patent, no obvious fenestration noted. Sherrine Maples shunt appears normal. MITRAL VALVE: No evidence of mitral valve regurgitation. TRICUSPID VALVE: Mild tricuspid valve regurgitation. AORTIC VALVE: The aortic valve is trileaflet and structurally normal, with normal leaflet excursion. Aorta is anterior of the pulmonic valve. No evidence of aortic regurgitation. PULMONIC VALVE: No indication of pulmonic valve regurgitation. Pulmonic is posterior of the aorta. AORTA: Normal mid ascending aorta and normal aortic arch. Mildly enlarged aortic root (3.9 cm). SYSTEMIC VEINS: The inferior vena cava is normal in size  and exhibits less than 50% respiratory change. PERICARDIUM: There is no evidence of pericardial effusion. ?????? CONCLUSIONS: ???1. 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 Low normal systemic RV systolic function. ???2. Left ventricular ejection fraction is approximately 55-60%. ???3. Double outlet right ventricle. ???4. Double outlet right ventricle ????????? -doubly commited ventricular septal defect ????????? -aorta anterior to pulmonary artery ????????? -no left ventricular outflow tract obstruction. ???5. L-transposition of the great arteries. ???6. Mild left atrial enlargement. ???7. Large secundum ASD. Status post extracardiac Fontan. Visualized portions of the Fontan appear patent, no obvious fenestration noted. Sherrine Maples shunt appears normal. ???8. Mild tricuspid valve regurgitation. ???9. Mildly enlarged aortic root (3.9 cm).    Abdominal US 06/21/15:  The pancreas is partially visualized and grossly unremarkable. The liver is mildly heterogeneous in echogenicity. There is no focal liver lesion. Portal vein demonstrates hepatopetal flow. No intra- or extrahepatic biliary dilation. The common bile duct is normal in caliber. The gallbladder is normally distended, containing gallstones. No gallbladder wall thickening or pericholecystic fluid. No sonographic Murphy's sign. The spleen is mildly enlarged. The kidneys are normal in size. Both kidneys demonstrate normal cortical thickness and echogenicity. No hydronephrosis. There is no ascites. The visualized proximal aorta and IVC are unremarkable. MEASUREMENTS: Liver: 15.1 cm Common Duct: 3 mm Right Kidney: 10.5 cm Left Kidney: 11.5 cm Spleen: 17 cm Aorta: 1.1 cm SHEAR WAVE LIVER STIFFNESS MEASUREMENTS: Mean 1.63 +/-0.12 m/sec, Median 1.66 m/sec, equating to 5.7-12 kPA. REFERENCE (m/s, kPa): Normal: 0.81-1.22 m/s 2.0-4.5 kPa (METAVIR F0) Normal/mild: 1.22-1.37 m/s 4.5-5.7 kPa (METAVIR F0-F1) Mild/moderate: 1.37-2 m/s 5.7-12.0 kPa (METAVIR F2-F3) Moderate/severe: 2-2.64 m/s 12.0-21.0 kPa (METAVIR F3-F4) Severe: >2.64 m/s >21.0 kPa (METAVIR F4) IMPRESSION: 1. Heterogenous liver echogenicity, suggesting diffuse liver disease. No suspicious liver lesions. Patent hepatic vasculature. 2. Shear wave liver stiffness measurement indicating mild/moderate liver fibrosis, MetaVir score of F2-3. 3. Splenomegaly. 4. Cholelithiasis without evidence of acute cholecystitis.    Echocardiogram 06/21/15:  2D AND M-MODE MEASUREMENTS (normal ranges within parentheses): Aorta/Left Atrium: Aortic Root, d (2D): 2.8 cm LV DIASTOLIC FUNCTION: MV Peak E: 1.61 m/s E/e' Ratio: 40.2 LV IVRT: 134 msec Decel Time: 229 msec Right Ventricle: TAPSE: 1.0 cm Aortic Valve: AoV Max Vel: 1.05 m/s AoV Peak PG: 4 mmHg AoV Mean PG: Tricuspid Valve and PA/RV Systolic Pressure: TR Max Velocity: 3.2 m/s RA Pressure: 8 mmHg RVSP/PASP: 49 mmHg Pulmonic Valve: PV Max Velocity: 0.8 m/s PV Max PG: 2 mmHg PV Mean PG: Aorta: Ao Asc: 2.4 cm. PHYSICIAN INTERPRETATION: CONOTRUNCAL ANATOMY: The cardiac structural malformations are consistent with a diagnosis of levo-transposition of the great arteries. 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 The aorta is anterior to the pulmonary artery. There is no sub-aortic obstruction of left ventricular outflow tract. LEFT VENTRICLE: Visually estimated left ventricular ejection fraction 55-60%. Small left ventricular size. RIGHT VENTRICLE: TAPSE 1.0 cm, (DTI 6.0 cm/s). 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 Low normal systemic RV systolic function. RIGHT ATRIUM: Right atrial pressure is estimated at 8 mmHg. Large secundum ASD. Status post extracardiac Fontan. Visualized portions of the Fontan appear patent, no obvious fenestration noted. Unable to obtain images of Glenn shunt. MITRAL VALVE: No evidence of mitral valve regurgitation. TRICUSPID VALVE: Mild tricuspid valve regurgitation. Tricuspid regurgitation velocity is not well seen. AORTIC VALVE: The aortic valve  is trileaflet and structurally normal, with normal leaflet excursion. Aorta is anterior of the pulmonic valve. No evidence of aortic regurgitation. PULMONIC VALVE: No indication of pulmonic valve regurgitation. Pulmonic is posterior of the aorta. AORTA: The aortic root is normal in size and structure, normal mid ascending aorta and normal aortic arch. SYSTEMIC VEINS: The inferior vena cava is not well visualized. PERICARDIUM: There is no evidence of pericardial effusion. CONCLUSIONS: 1. Small left ventricular size 2. 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 3. L-transposition of the great arteries. 4. Mild tricuspid valve regurgitation. 5. 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 Low normal systemic RV systolic function.     CPEX/Echo 06/11/15:  BASELINE: Low normal systemic RV systolic function at rest. Hypoplastic LV. ADDITIONAL BASELINE FINDINGS: MITRAL VALVE: Trace mitral valve regurgitation. TRICUSPID VALVE: Trace tricuspid valve regurgitation. No new segmental wall motion abnormalities were seen. Global left ventricular systolic function at peak stress is normal (LVEF 60-65%). Improved systemic RV systolic function with stress. No increase in degree of atrioventricular valve regurgitation with exercise. SUMMARY: 1. Improved systemic RV systolic function with stress. No increase in degree of atrioventricular valve regurgitation with exercise. 2. Low normal systemic RV systolic function at rest. Hypoplastic LV. BASELINE ECG: Ventricular paced rhythm PROTOCOL: Bruce. CONTROL: HR: 74 BP: 120/74 O2 Sat: 95 % START: 1.7 MPH at 10% grade. STOP: 12.7 METS with 4.2 mph at 16 % grade x 1???33'' after 10???33'' total exercise time due to shortness of breath. Peak HR: 141 Peak BP: 136/56 Peak O2 Sat: 95 % Peak Double-Product: 19,176 Maximum VO2 = 1386 ml which is 77 % of predicted, maximum VO2/kg = 24.8 ml/kg which is 88 % of predicted (Note: The reference values are adjusted for weight and modality of exercise) VE/VO2 = 35 VE/VCO2 = 31 VE/VCO2 slope = 23.1 VO2/HR = 9.8 which is 99 % predicted Breathing Reserve = 50 RQ= 1.13 Anaerobic Threshold was reached at HR = 103 VO2 = 1094 ml VO2/kg = 19.6 ml/kg RESULTS: Symptoms: Shortness of breath and leg fatigue; no chest pain or discomfort. ST-T Changes: Uninterpretable due to paced rhythm. Dysrhythmias: Rare PVC during exercise BP Response: Appropriate. IMPRESSIONS: Good exercise tolerance achieving 77 % maximum predicted heart rate, limited by shortness of breath. Exercise induced ST changes are uninterpretable due to paced rhythm. No exercise induced chest pain at a low double product. Rare PVCs as described above. Maximum VO2 = 1386 ml which is 77 % of predicted, maximum VO2/kg = 24.8 ml/kg which is 88 % of predicted . VE/VCO2 slope = 23.1 which is 92 % of predicted. Compared with previous stress/CPX of 04/24/13 maximum VO2/kg had been 25.0 at 11.9 METS    Echocardiogram:  04/21/13: CONCLUSIONS:  1. Double outlet right ventricle.  2. Moderately enlarged right ventricular size and low normal systolic function. 3. Moderately increased RV wall thickness. 4. Mild tricuspid regurgitation. 5. Status post extracardiac Fontan, appears widely patent, no fenestration noted. Sherrine Maples shunt not well visualized.    Labs:      03/22/20- outside labs-  Protein 7.9, alb 4.4    Component      Latest Ref Rng & Units 07/29/2019   Sodium      135 - 146 mmol/L 136   Potassium      3.6 - 5.3 mmol/L 3.9   Chloride      96 -  106 mmol/L 97   Total CO2      20 - 30 mmol/L 22   Anion Gap      8 - 19 mmol/L 17   Glucose      65 - 99 mg/dL 70   GFR Est.for Non-African Americ      See GFR Additional Information mL/min/1.53m2 89   GFR Est.for African American      See GFR Additional Information mL/min/1.73m2 >89   GFR Additional Information       See Comment   Creatinine      0.60 - 1.30 mg/dL 1.61   Urea Nitrogen      7 - 22 mg/dL 16   Calcium      8.6 - 10.4 mg/dL 9.7   TOTAL PROTEIN      6.1 - 8.2 g/dL 8.3 (H)   Albumin      3.9 - 5.0 g/dL 5.3 (H)   Bilirubin,Total      0.1 - 1.2 mg/dL 1.0   Alkaline Phosphatase      37 - 113 U/L 69   AST (SGOT)      13 - 47 U/L 26   ALT (SGPT)      8 - 64 U/L 24   White Blood Cell Count      4.16 - 9.95 x10E3/uL 4.46   Red Blood Cell Count      3.96 - 5.09 x10E6/uL 5.13 (H)   Hemoglobin      11.6 - 15.2 g/dL 09.6   Hematocrit      34.9 - 45.2 % 45.7 (H)   Mean Corpuscular Volume      79.3 - 98.6 fL 89.1   Mean Corpuscular Hemoglobin      26.4 - 33.4 pg 29.4   MCH Concentration      31.5 - 35.5 g/dL 04.5   Red Cell Distribution Width-SD      36.9 - 48.3 fL 44.5   Red Cell Distribution Width-CV      11.1 - 15.5 % 13.6   Platelet Count, Auto      143 - 398 x10E3/uL 124 (L)   Mean Platelet Volume      9.3 - 13.0 fL 12.1   Nucleated RBC%, automated      No Ref. Range % 0.0   Absolute Nucleated RBC Count      0.00 - 0.00 x10E3/uL 0.00   Prothrombin Time      11.5 - 14.4 seconds 14.7 (H)   INR      . 1.2   TSH      0.3 - 4.7 mcIU/mL 3.7   GGT      7 - 68 U/L 129 (H)   AFP      0 - 6.7 ng/mL 2.9   BNP      <100 pg/mL 134 (H)     08/18/18: WBC 5.8 RBC 5.11 Hgb 15 Hct 44.5 Plt 89 NA 139 K 5.3 BUN 16 Creat 0.81 Gluc 71 BNP 149 TSH 2.39  08/08/17: Creat 0.72, BUN 12, K 3.8  07/01/17:  Chol 121, trig 58, HDL 40, LDL 69, Hgb A1c 5.4, INR 1.2, prealbumin 24, TSH 3.6, Hct 43.2, Plat 82k  07/26/16: WBC 3.0, HGB 4.69, HCT 40.7, PLT 75, NA 137, K 3.8, BUN 13, CREAT 0.7, ALK PHOS 57, AST 30, ALT 38, TBil 1.3, GGT 100, PREALB 24, BNP 113, INR 1.2, TSH 4.24, FT3 3.6, FT4 1.05.  04/01/13:  Hgb 14.7, Hct 42.4, MCV  85.1, plat ct 106k, Na 143, K 4.2, creat 0.8, BUN 19, AST 36, ALT 32    Impressions:  1. DORV (although recent echos appear to show a DILV with single LVEF 50%, and earlier surgical notes describe her anatomy as DILV), L-malposed great arteries: s/p RA-PA anastomosis Fontan at age 44 years, now s/p Extracardiac Fontan and bidirectional Sherrine Maples shunt on 11/21/07 by Dr. Richardo Hanks, including bi-atrial Maze procedure and placement of a permanent atrial pacing lead.  2. Postoperative junctional rhythm and bradycardia: re-admitted 4 days after discharge with large right pericardial effusion requiring thoracentesis and placement of an abdominal pacemaker generator and epicardial ventricular lead on 12/05/07 (non-functioning atrial lead).  3. Ascites, improved on aldactone. No evidence of ascites on annual abdominal US.  4. Longstanding history of irregular and heavy menses, thus far unsuccessfully regulated on several types of hormonal therapy. Better controlled on Mirena since 2012.  Also has cervical HPV.  Now with CIN III, underwent LEEP procedure in Jan 2018  5. History of atrial fibrillation, but no post Fontan revision tachyarrhythmias thus far, formerly on coumadin (possible side effect of hair loss), now on ASA.  6. Longstanding elevated left hemidiapragm, s/p successful plication on 03/02/2009 by Dr. Larwance Sachs.  7. Liver US in 2014, 2016 and 2017, and 2018 shows no lesions, but 2017 and 2018 outside liver US report Metavir score of 4, which is consistent with cirrhosis.  2019 liver US at [facility] showed MetaVir stage F2-F3, although elastography impacted by congestive hepatopathy.   8. Cardiac cath and liver biopsy on 08/08/17, with mean Fontan pressure under sedation of , and liver biopsy showing extensive bridging fibrosis but no cirrhosis.  Spirinolactone was increased from 25 to 50mg  to 100mg  after this cath  9. Ventricular pacemaker, pacing 99% of time.   Generator replaced on 12/12/17 by Dr. Larwance Sachs. Deferred placement of transvenous atrial lead via her LPA to minimize V pacing, based on Vasti's wish not to be committed to anticoagulation  10. Stable exercise capacity in July 2019, with MVO2 23.7 (85% predicted), previously 24.8 and 88% predicted in 2016    [facility]    Labs (BMP, BNP, LFTs, GGT, AFP, INR, CBC, Prealbumin):  []  Within last year (date: 07/29/2019 )  [x]  Ordered today  []  Deferred:      Hep C screening  [x]  Prior HCV Ab negative (date: 06/21/15)  []  Not indicated (no surgery prior to early 1990s)  []  Ordered    Last hemodynamic cath:   [x]  Within last 10 years (date: 08/08/17), see above results (prior to Fontan revision)  []  Deferred. Reason:   []  Scheduled    Last Liver biopsy  [x]  Within last 10 years (date:08/08/17), see above results  []  Scheduled with cath    Liver Imaging:  []  MRI Abdomen within last 5 years (date: ), see above  []  Triple phase CT within last 5 years (date: ), see above  []  Liver ultrasound completed (date: 05/13/18), see above  [x]  Ordered today, see above.     Advanced cardiac imaging:  []  Cardiac MRI (date: ), see above  [x]  Cardiac CT (date: 12/12/2007), see above  [x]  Deferred. Reason: Pacemaker  []  Ordered today, see above     PDE-5 Inhibitor recommended:  [x]  On sildenafil []  On tadalafil  []  Not tolerated. Previously on []  sildenafil (Year: )  []  tadalafil (Year: )   Side effect:  []  Deferred. Reason:   []  Ordered today, see above    Echo  [  x] Within last year (date: 08/09/20), see above results  []  Deferred. Reason:   []  Ordered today    Cardiopulmonary exercise test  [x]  Within last 3 years (date: 05/13/18), see above results  []  Deferred. Reason:   []  Ordered today    Advanced care planning  [x]  Date: Advanced directive in CC from 12/27/2010  []  Deferred. Reason:       Recommendations:   1. We had a long conversation today regarding her various questions and issues as detailed in HPI- telehealth visit was 50 minutes.  2. Regarding the issue of ENT surgery for vocal chord polyp removal, she was told this would need to be done under deep sedation or possibly general anesthesia, therefore, would recommend this be done at an institution with [facility] expertise given her complex congenital cardiac history.  3. Regarding the low WBC count, I have encouraged her to be seen by hematology as recommended by the [facility] program.  4. Regarding estrogen supplementation, I informed her that my preference would be for her to be on full anticoagulation (preferably with warfarin) if she is going to be on estrogen supplementation given the thromboembolic risks.  She will also set up a visit with Dr Whitney Post at [facility] to get his input regarding this issue.  5. Regarding the use of stimulant medications for ADD, I recommend against this given arrhythmic risk in a Fontan patient.  6. She will continue regular in person follow-up with the [facility] program, we remain available to assist as needed but will defer the usual Fontan health testing to our colleagues at [facility].  I will send a copy of my note today to Dr Adriana Simas.           Copy to:    Dr Whitney Post  Director, Adult Congenital Heart Program  [facility]  [address] [place], [address]  [place], [place] [address]  Fax [phone]

## 2021-02-03 MED ORDER — PANTOPRAZOLE SODIUM 20 MG PO TBEC
ORAL_TABLET | 0 refills | Status: AC
Start: 2021-02-03 — End: ?

## 2021-02-08 ENCOUNTER — Inpatient Hospital Stay: Payer: BLUE CROSS/BLUE SHIELD | Attending: Pediatric Cardiology

## 2021-02-08 DIAGNOSIS — R001 Bradycardia, unspecified: Secondary | ICD-10-CM

## 2021-02-08 DIAGNOSIS — Z950 Presence of cardiac pacemaker: Secondary | ICD-10-CM

## 2021-02-09 NOTE — Procedures
SEE MEDIA SECTION FOR DOWNLOAD PRINTOUT     Ann Duran is a 44 y.o. female  [id number]  Reading MD:  Dr Granite Godman    A Medtronic Carelink remote monitor was received as a routine download. See Media for full report.

## 2021-03-02 ENCOUNTER — Telehealth: Payer: BLUE CROSS/BLUE SHIELD

## 2021-03-10 MED ORDER — FUROSEMIDE 80 MG PO TABS
ORAL_TABLET | 3 refills
Start: 2021-03-10 — End: ?

## 2021-03-14 MED ORDER — FUROSEMIDE 80 MG PO TABS
ORAL_TABLET | 3 refills | Status: AC
Start: 2021-03-14 — End: ?

## 2021-05-10 ENCOUNTER — Inpatient Hospital Stay: Payer: BLUE CROSS/BLUE SHIELD | Attending: Pediatric Cardiology

## 2021-05-10 DIAGNOSIS — R001 Bradycardia, unspecified: Secondary | ICD-10-CM

## 2021-05-10 DIAGNOSIS — Z950 Presence of cardiac pacemaker: Secondary | ICD-10-CM

## 2021-05-10 NOTE — Procedures
SEE MEDIA SECTION FOR DOWNLOAD PRINTOUT     Ann Duran is a 44 y.o. female  [id number]  Reading MD:  Dr Shannon    A Medtronic Carelink remote monitor was received as a routine download. See Media for full report.

## 2021-05-22 MED ORDER — SPIRONOLACTONE 50 MG PO TABS
ORAL_TABLET | ORAL | 3 refills | 30.00000 days | Status: AC
Start: 2021-05-22 — End: ?

## 2021-05-26 ENCOUNTER — Ambulatory Visit: Payer: BLUE CROSS/BLUE SHIELD

## 2021-06-22 ENCOUNTER — Telehealth: Payer: BLUE CROSS/BLUE SHIELD

## 2021-06-22 NOTE — Telephone Encounter
Left message on patients cell to check status if images will be sent to [place] by [facility] before we schedule visit

## 2021-06-22 NOTE — Telephone Encounter
Hi, please move forward with obtaining records/images and then scheduling telehealth visit. I won't comment on outside studies myself. Thanks

## 2021-06-22 NOTE — Telephone Encounter
Results Request - The patient would like to discuss the results of their recent tests. If patient does not her cell phone okay to leave a voice mail.    1) What type of test(s)? Heart cath and liver biopsy    2) When was it performed? 06/01/21    3) Where was it performed? [facility]    If [facility], are results available in [facility]? Yes  If outside facility, what is their phone number?     Patient or caller has been notified of the 24-48 hour turnaround time.

## 2021-08-08 ENCOUNTER — Telehealth: Payer: PRIVATE HEALTH INSURANCE

## 2021-08-09 ENCOUNTER — Inpatient Hospital Stay: Payer: BLUE CROSS/BLUE SHIELD

## 2021-08-09 DIAGNOSIS — Z950 Presence of cardiac pacemaker: Secondary | ICD-10-CM

## 2021-08-09 DIAGNOSIS — Q204 Double inlet ventricle: Secondary | ICD-10-CM

## 2021-08-09 DIAGNOSIS — R001 Bradycardia, unspecified: Secondary | ICD-10-CM

## 2021-08-09 NOTE — Procedures
SEE MEDIA SECTION FOR DOWNLOAD PRINTOUT     Ann Duran is a 44 y.o. female  [id number]  Reading MD:  Dr Najma Bozarth    A Medtronic Carelink remote monitor was received as a routine download. See Media for full report.

## 2021-09-04 MED ORDER — PANTOPRAZOLE SODIUM 20 MG PO TBEC
ORAL_TABLET | 1 refills
Start: 2021-09-04 — End: ?

## 2021-09-05 MED ORDER — PANTOPRAZOLE SODIUM 20 MG PO TBEC
ORAL_TABLET | 1 refills
Start: 2021-09-05 — End: ?

## 2021-10-04 ENCOUNTER — Telehealth: Payer: BLUE CROSS/BLUE SHIELD

## 2021-10-06 ENCOUNTER — Non-Acute Institutional Stay: Payer: BLUE CROSS/BLUE SHIELD

## 2021-10-06 DIAGNOSIS — R001 Bradycardia, unspecified: Secondary | ICD-10-CM

## 2021-10-06 DIAGNOSIS — Z950 Presence of cardiac pacemaker: Secondary | ICD-10-CM

## 2021-10-06 NOTE — Telephone Encounter
Spoke with patient, does not need a monitor, needed an appointment for follow up with Dr. Carollee Herter and pacemaker evaluation.  Appointment scheduled for 12/20 @ 10 am.  All questions answered.

## 2021-10-24 ENCOUNTER — Ambulatory Visit: Payer: BLUE CROSS/BLUE SHIELD | Attending: Pediatric Cardiology

## 2021-10-24 ENCOUNTER — Inpatient Hospital Stay: Payer: BLUE CROSS/BLUE SHIELD

## 2021-10-24 DIAGNOSIS — Z950 Presence of cardiac pacemaker: Secondary | ICD-10-CM

## 2021-10-24 DIAGNOSIS — Q201 Double outlet right ventricle: Secondary | ICD-10-CM

## 2021-10-24 DIAGNOSIS — R001 Bradycardia, unspecified: Secondary | ICD-10-CM

## 2021-10-31 ENCOUNTER — Telehealth: Payer: BLUE CROSS/BLUE SHIELD

## 2021-10-31 DIAGNOSIS — Q204 Double inlet ventricle: Secondary | ICD-10-CM

## 2021-10-31 DIAGNOSIS — Z9889 Other specified postprocedural states: Secondary | ICD-10-CM

## 2021-10-31 DIAGNOSIS — Q201 Double outlet right ventricle: Secondary | ICD-10-CM

## 2021-10-31 DIAGNOSIS — Z950 Presence of cardiac pacemaker: Secondary | ICD-10-CM

## 2021-10-31 DIAGNOSIS — I2721 Secondary pulmonary arterial hypertension: Secondary | ICD-10-CM

## 2021-10-31 MED ORDER — PANTOPRAZOLE SODIUM 20 MG PO TBEC
ORAL_TABLET | 0 refills | Status: AC
Start: 2021-10-31 — End: ?

## 2021-10-31 MED ORDER — SPIRONOLACTONE 50 MG PO TABS
100 mg | ORAL_TABLET | Freq: Every day | ORAL | 0 refills | Status: AC
Start: 2021-10-31 — End: ?

## 2021-10-31 MED ORDER — FUROSEMIDE 80 MG PO TABS
80 mg | ORAL_TABLET | Freq: Every day | ORAL | 0 refills | Status: AC
Start: 2021-10-31 — End: ?

## 2021-10-31 MED ORDER — SILDENAFIL CITRATE 20 MG PO TABS
20 mg | ORAL_TABLET | Freq: Three times a day (TID) | ORAL | 0 refills | Status: AC
Start: 2021-10-31 — End: 2021-11-08

## 2021-10-31 MED ORDER — POTASSIUM CHLORIDE ER 10 MEQ PO TBCR
10 meq | ORAL_TABLET | Freq: Every day | ORAL | 0 refills | Status: AC
Start: 2021-10-31 — End: ?

## 2021-10-31 NOTE — Telephone Encounter
Message to Practice/Provider      Message: pt on vacation out of town, doesnt have enough rx to last till shes back home. requesting a 10 day refill to nearby pharmacy    -FUROSEMIDE 80 mg tablet [[id number]]    -Sildenafil  -SPIRONOLACTONE 50 mg tablet [[id number]]    -PANTOPRAZOLE 20 mg DR tablet [[id number]]   -potassium chloride 10 mEq CR tablet [[id number]]       [facility]  [address]  [place], [place]  Fax: [phone]    Return call is not being requested by the patient or caller.    Patient or caller has been notified of the turnaround time of 1-2 business day(s).

## 2021-10-31 NOTE — Telephone Encounter
I spoke to pt, we will send in 10 days of refills

## 2021-11-03 MED ORDER — REVATIO 20 MG PO TABS
ORAL_TABLET | 3 refills
Start: 2021-11-03 — End: ?

## 2021-11-07 ENCOUNTER — Ambulatory Visit: Payer: BLUE CROSS/BLUE SHIELD

## 2021-11-07 ENCOUNTER — Non-Acute Institutional Stay: Payer: BLUE CROSS/BLUE SHIELD

## 2021-11-07 ENCOUNTER — Telehealth: Payer: BLUE CROSS/BLUE SHIELD

## 2021-11-07 DIAGNOSIS — I2721 Secondary pulmonary arterial hypertension: Secondary | ICD-10-CM

## 2021-11-07 MED ORDER — REVATIO 20 MG PO TABS
ORAL_TABLET | 3 refills
Start: 2021-11-07 — End: ?

## 2021-11-07 MED ORDER — SILDENAFIL CITRATE 20 MG PO TABS
20 mg | ORAL_TABLET | Freq: Three times a day (TID) | ORAL | 3 refills | Status: AC
Start: 2021-11-07 — End: ?

## 2021-11-07 NOTE — Telephone Encounter
Rx refill for revatio received. Pt has shared care with [facility] and [facility]. Her last visit with our team in 3/22 says to continue. Her last visit with [facility] in Nov 2022, she reports no change in medication.     Pt had a recent visit with Dr. Carollee Herter at [facility] and plans to make an appt with Dr. Tobie Poet for the late this year. She will call to schedule.

## 2021-11-08 ENCOUNTER — Inpatient Hospital Stay: Payer: BLUE CROSS/BLUE SHIELD

## 2021-11-08 DIAGNOSIS — Z950 Presence of cardiac pacemaker: Secondary | ICD-10-CM

## 2021-11-08 DIAGNOSIS — R001 Bradycardia, unspecified: Secondary | ICD-10-CM

## 2021-11-09 NOTE — Procedures
SEE MEDIA SECTION FOR DOWNLOAD PRINTOUT     Ann Duran is a 45 y.o. female  [id number]  Reading MD:  Dr Aastha Dayley    A Medtronic Carelink remote monitor was received as a routine download. See Media for full report.

## 2021-11-15 ENCOUNTER — Telehealth: Payer: BLUE CROSS/BLUE SHIELD

## 2021-11-15 ENCOUNTER — Non-Acute Institutional Stay: Payer: BLUE CROSS/BLUE SHIELD

## 2021-11-15 DIAGNOSIS — Q204 Double inlet ventricle: Secondary | ICD-10-CM

## 2021-11-15 DIAGNOSIS — Q201 Double outlet right ventricle: Secondary | ICD-10-CM

## 2021-11-15 DIAGNOSIS — Z950 Presence of cardiac pacemaker: Secondary | ICD-10-CM

## 2021-11-15 DIAGNOSIS — Z9889 Other specified postprocedural states: Secondary | ICD-10-CM

## 2021-11-15 MED ORDER — SPIRONOLACTONE 50 MG PO TABS
100 mg | ORAL_TABLET | Freq: Every day | ORAL | 3 refills | 30.00000 days | Status: AC
Start: 2021-11-15 — End: ?

## 2021-11-15 MED ORDER — SPIRONOLACTONE 50 MG PO TABS
100 mg | ORAL_TABLET | Freq: Every day | ORAL | 3 refills | 30.00000 days
Start: 2021-11-15 — End: ?

## 2021-11-15 MED ORDER — FUROSEMIDE 80 MG PO TABS
80 mg | ORAL_TABLET | Freq: Every day | ORAL | 3 refills
Start: 2021-11-15 — End: ?

## 2021-11-15 MED ORDER — FUROSEMIDE 80 MG PO TABS
80 mg | ORAL_TABLET | Freq: Every day | ORAL | 3 refills | Status: AC
Start: 2021-11-15 — End: ?

## 2021-11-15 MED ORDER — PANTOPRAZOLE SODIUM 20 MG PO TBEC
ORAL_TABLET | 3 refills
Start: 2021-11-15 — End: ?

## 2021-11-15 MED ORDER — PANTOPRAZOLE SODIUM 20 MG PO TBEC
ORAL_TABLET | 3 refills | Status: AC
Start: 2021-11-15 — End: ?

## 2021-11-15 MED ORDER — POTASSIUM CHLORIDE ER 10 MEQ PO TBCR
10 meq | ORAL_TABLET | Freq: Every day | ORAL | 3 refills
Start: 2021-11-15 — End: ?

## 2021-11-15 MED ORDER — POTASSIUM CHLORIDE ER 10 MEQ PO TBCR
10 meq | ORAL_TABLET | Freq: Every day | ORAL | 3 refills | Status: AC
Start: 2021-11-15 — End: ?

## 2021-11-29 NOTE — Procedures
SEE MEDIA SECTION FOR PROGRAMMER PRINTOUT   [facility] Pacemaker  ?  PATIENT: Ann Duran  MRN: [id number]  DOB: 11/14/1975  ?  DATE OF PROCEDURE:?10/24/2021  TIME OF PROCEDURE:?1015  ?  INDICATIONS:?Annual pacemaker evaluation   PROCEDURE:?Pacemaker evaluation  ?  Location: [facility]  ?  Technician: Candie Mile, NP   ?  READING PHYSICIAN:??Dr. Charlesetta Garibaldi  ?  Device:  Device Manufacturer: Medtronic  Device Model Name: Jamse Mead DR MRI  Model Number: [id number]  Device Serial Number: [id number]  Device Implant Date: 12/12/2017  Indication for Implant:   ?  RA lead:   Manufacturer: Medtronic   Model Number: [id number]  Serial Number: [id number]  Date of Implant: 12/05/2007  ?  RV lead:  Manufacturer: Medtronic   Model Number: [id number]  Serial Number: [id number]  Date of Implant: 12/05/2007  ?  Battery: 2.5 years  ?  Underlying Rhythm: Ventricular rate ~50 bpm  R wave: 16.9?mV  ?  Impedance:  A: 399 ohms  V: 570?ohms  ?  Capture threshold  V:?2.5?V @ 0.8 ms  ?  Since 08/09/2020  ?  Tachy episode summary:  VT/VF: 0  ?  AP: 0%  VP: 99.8%  ?  Programmed changes:?  VT monitor/alerts turned on   ?  Settings:   Mode: VVIR  Rate: Lower/Upper Sensor: 70/150 bpm  RV Output: 3.5 V @ 0.8 ms  RV Sensitivity: 2.0 mV  ?  Assessment  1. 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 S/p Fontan conversion to extracardiac baffle on 11/21/07.  2. Underwent surgical placement of abdominal pacemaker generator on 12/05/07, but atrial lead was non-capturing, left with single site ventricular pacing. Generator change 12/12/2017 by Dr. Larwance Sachs  3. Patient is not pacemaker dependent at this time, underlying ventricular rate ~50 bpm.  4. Normal pacemaker function.  5. No high rate episodes.  ?  Plan:  1. Patient seen today by Dr. Charlesetta Garibaldi, see separate EP note.  2. Programmed changes:?VT monitor/alerts turned on   3. Remote monitoring every 3 months.  4. Return for device evaluation and follow up with Dr. Carollee Herter in 1 year, sooner for any symptoms.      Candie Mile, NP

## 2021-12-01 NOTE — Progress Notes
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    Subjective:   Ann Duran is a 46 y.o. female who was seen in clinic for pacemaker evaluation. He cardiac history is remarkable foir a diagnosis of L-transposition/ single ventricle. Her first cardiac surgery was a Modified RA to pA Fonatn procedure at age 7. She required a thoracic duct ligation after her Fontan and then reportedly did well until age 25 when she presented with a complaint of fatigue and was diagnosed with atrial arrhythmias. She then underwent a Fontan conversion in Jan 2009 at which time pacemaker leads were placed but the device was placed 2 weeks later after she developed bradycardia and fluid retention. The atrial lead was non functional and so she was VVI paced. She underwent attempted atrial lead revision with diaphragm plication in April of 2010, but the new atrial lead also failed to function. She was tried on VVI backup pacing but was eventually changed to VVIR at 70 bpm lower rate, which she has tolerated well since 2011, but has had increasing requirement for ventricular pacing.    Interval Events:  [name] has been doing well. She denies any recent palpitations, dizziness, syncope, chest pain or shortness of breath.     Past Medical History:  Past Medical History:   Diagnosis Date   ? Anxiety     controlled per pt   ? Cholelithiases     Noted on ultrasound, asymptomatic    ? Delayed emergence from general anesthesia 2010    pacemaker insertion, per patient   ? DORV (double outlet right ventricle)    ? GERD (gastroesophageal reflux disease)     well controlled w/med and diet   ? History of blood transfusion    ? HPV (human papilloma virus) infection    ? IUD (intrauterine device) in place     Mirena placed 12/27/10, replaced 12/2015, replaced November 16, 2016   ? 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  Negative tobacco use.  Rare EtOH.    Review of Systems:  14 point review of system negative other than as stated above    Medications:  Outpatient Medications Prior to Visit   Medication Sig Dispense Refill   ? Ascorbic Acid (VITAMIN C) 1000 MG tablet Take 1,000 mg by mouth as needed for .     ? aspirin 81 mg EC tablet Take 81 mg by mouth daily     ? clonazePAM 0.5 mg tablet TAKE 1 TABLET(0.5 MG) BY MOUTH AT NIGHT AS NEEDED FOR INSOMNIA     ? cyanocobalamin 1000 mcg tablet Take 1,000 mcg by mouth as needed for .     ? ibuprofen 200 mg tablet Take 400 mg by mouth every six (6) hours as needed.     ? lisinopril 2.5 mg tablet Take 1 tablet (2.5 mg total) by mouth daily.     ? sertraline 25 mg tablet Take 1 tablet (25 mg total) by mouth daily.     ? valacyclovir 500 mg tablet ValACYclovir HCl - 500 MG Oral Tablet     ? zolpidem 5 mg tablet Take 2.5  mg by mouth at bedtime as needed for Sleep.     ? FUROSEMIDE 80 mg tablet TAKE 1 TABLET BY MOUTH  DAILY 90 tablet 3   ? PANTOPRAZOLE 20 mg DR tablet TAKE 1 TABLET BY MOUTH  DAILY AS NEEDED 90 tablet 0   ? potassium chloride 10 mEq CR tablet Take 1 tablet (10 mEq total) by mouth daily. 30 tablet 3   ? SPIRONOLACTONE 50 mg tablet TAKE 2 TABLETS BY MOUTH  DAILY 180 tablet 3   ? alprazolam 0.5 mg tablet Take 0.25 mg by mouth at bedtime as needed. (Patient not taking: Reported on 10/24/2021.)     ? fluticasone 50 mcg/act nasal spray instill 1 spray into each nostril once daily (Patient not taking: Reported on 10/24/2021.)  0   ? LORazepam 0.5 mg tablet Take 0.5 mg by mouth as needed for for Sleep. (Patient not taking: Reported on 10/24/2021.)       No facility-administered medications prior to visit.     Allergies:  No Known Allergies      Objective:     Vitals:  BP 114/70 (BP Location: Right arm, Patient Position: Sitting, Cuff Size: Regular)  ~ Pulse 77  ~ Temp 36.6 ?C (97.9 ?F) (Tympanic)  ~ Resp 18  ~ Ht 1.651 m (5' 5'')  ~ Wt 61.3 kg (135 lb 2.3 oz)  ~ SpO2 93%  ~ BMI 22.49 kg/m?  Facility age limit for growth percentiles is 20 years. Facility age limit for growth percentiles is 20 years. Facility age limit for growth percentiles is 20 years.    General:   alert and no distress   Skin:   warm and dry, no hyperpigmentation, vitiligo, or suspicious lesions   Nodes:   cervical, supraclavicular, and axillary nodes normal.   Eyes:   No conjunctival injection, sclerae anicteric   Oropharynx:  lips, mucosa, and tongue normal; teeth and gums normal   Neck:  no adenopathy, no carotid bruit, no JVD, supple, symmetrical, trachea midline and thyroid not enlarged, symmetric, no tenderness/mass/nodules   Lungs:   clear to auscultation bilaterally   Heart:   regular rate and rhythm, S1: single and S2: normal   Abdomen:   soft, non-tender; bowel sounds normal; no masses,  no organomegaly   Extremities:  extremities normal, atraumatic, no cyanosis or edema   Psychiatric:   normal mood, behavior, speech, dress, and thought processes     Pacemaker Evaluation 10/24/21:  Underlying Rhythm: Bradycardia ~50?s bpm  Underlying Rhythm: Ventricular rate ~50 bpm  R wave: 11.6 mV  Impedance:  V: 570 ohms?  Capture threshold  V: 2.5 V @ 0.8 ms  ??  AP: 0%  VP: 99.8%  ?  Programmed changes: VT monitor alerts turned on  ?  Settings:   Mode: VVIR  Rate: Lower/Max Track: 70/ 150 bpm  RV Output: 3.5 V @ 0.8 ms  RV Sensitivity: 2.0 mV    Assessment:   46 y.o. female with DORV status post Fontan conversion to extracardiac baffle on 11/21/2007, with subsequent placement of an epicardial pacemaker for sinus node dysfunction.  She had atrial lead failure shortly after initial placement.  In 2010 she underwent an unsuccessful attempt at placement of a new atrial lead.    She returns today for routine follow up. She underwent pacemaker generator replacement (Medtronic) on 12/12/17 by Dr. Larwance Sachs. She remains in VVI mode. She continues to be stable with ventricular pacing, no indicatioon for intervention at this time.  Plan/ Recommendation:     Remote monitoring every three months and return to clinic in one year.    Vania Rea. Carollee Herter M.D.  Pediatric Cardiology

## 2021-12-21 ENCOUNTER — Telehealth: Payer: BLUE CROSS/BLUE SHIELD

## 2021-12-21 DIAGNOSIS — I2721 Secondary pulmonary arterial hypertension: Secondary | ICD-10-CM

## 2021-12-21 MED ORDER — SILDENAFIL CITRATE 20 MG PO TABS
20 mg | ORAL_TABLET | Freq: Three times a day (TID) | ORAL | 1 refills | Status: AC
Start: 2021-12-21 — End: 2021-12-28

## 2021-12-21 NOTE — Telephone Encounter
Brand Revatio sent in, pt is enrolled in assist program

## 2021-12-21 NOTE — Telephone Encounter
New Rx Request      Last seen by MD: 3.15.22    Reason for the request:  Patient was prescribed the generic Rx for Revatio - (Sildenafil) but the patient would like to have the name brand Rx prescribed for her. Pharmacist from [facility] is calling on behalf of the patient for the request.    Any Symptoms:  []  Yes  [x]  No       If yes, what symptoms are you experiencing:    o Duration of symptoms (how long):    o Have you taken medication for symptoms (OTC or Rx):      Is a particular medication being requested? Revatio    Was an appointment offered? No    Patient or caller has been notified of the turnaround time of 1-2 business day(s).

## 2021-12-27 ENCOUNTER — Non-Acute Institutional Stay: Payer: BLUE CROSS/BLUE SHIELD

## 2021-12-27 ENCOUNTER — Telehealth: Payer: BLUE CROSS/BLUE SHIELD

## 2021-12-27 DIAGNOSIS — I2721 Secondary pulmonary arterial hypertension: Secondary | ICD-10-CM

## 2021-12-27 MED ORDER — SILDENAFIL CITRATE 20 MG PO TABS
20 mg | ORAL_TABLET | Freq: Three times a day (TID) | ORAL | 3 refills | Status: AC
Start: 2021-12-27 — End: 2021-12-28

## 2021-12-27 MED ORDER — SILDENAFIL CITRATE 20 MG PO TABS
20 mg | ORAL_TABLET | Freq: Three times a day (TID) | ORAL | 3 refills | Status: AC
Start: 2021-12-27 — End: 2021-12-29

## 2021-12-27 NOTE — Telephone Encounter
Confirmed Rx sent. Their pharmacy confirmed. Cost is $200 for 90 days or $95 for 30 days. Confirmed previously, lower cost last year. There was a change at the first of the year. Pt instructed to contact her insurance company to verify change in Rx cost.     Informed of [employer] drug program if needed.

## 2021-12-27 NOTE — Telephone Encounter
Message to Practice/Provider      Message: Anderson Malta from [facility] wanted to let Elmyra Ricks know that for brand name Revatio prior authorization is needed for  non-formulary . Prior authorization phone # [phone].    Return call is not being requested by the patient or caller.    Patient or caller has been notified of the turnaround time of 1-2 business day(s).

## 2021-12-27 NOTE — Telephone Encounter
PDL Call to Clinic    Reason for Call:    [facility] Ramyah Duran c/b# [phone]  Is calling a about a refill request for:  sildenafil (REVATIO) 20 mg tablet   F/u on request.   Appointment Related?  []  Yes  [x]  No     If yes;  Date:  Time:    Call warm transferred to PDL: [x]  Yes  []  No    Call Received by Clinic Representative:  Adriana  If call not answered/not accepted, call received by Patient Services Representative:

## 2021-12-28 ENCOUNTER — Ambulatory Visit: Payer: BLUE CROSS/BLUE SHIELD

## 2021-12-28 ENCOUNTER — Telehealth: Payer: BLUE CROSS/BLUE SHIELD

## 2021-12-28 DIAGNOSIS — I2721 Secondary pulmonary arterial hypertension: Secondary | ICD-10-CM

## 2021-12-28 MED ORDER — SILDENAFIL CITRATE 20 MG PO TABS
20 mg | ORAL_TABLET | Freq: Three times a day (TID) | ORAL | 3 refills | Status: AC
Start: 2021-12-28 — End: ?

## 2021-12-28 NOTE — Telephone Encounter
PDL Call to Clinic    Reason for Call: [facility] calling in regards to medication for sildenafil (REVATIO) 20 mg tablet    Appointment Related?  []  Yes  [x]  No     If yes;  Date:  Time:    Call warm transferred to PDL: [x]  Yes  []  No    Call Received by Clinic Representative: andrew    If call not answered/not accepted, call received by Patient Services Representative:

## 2022-01-01 ENCOUNTER — Telehealth: Payer: BLUE CROSS/BLUE SHIELD

## 2022-01-01 NOTE — Telephone Encounter
Call connected    [facility] BC agent Lafonda Mosses looking to speak to [place] regarding patients brand name- REVATIO    Patient was also on the line looking to speak to [place] as well, advised Joni Reining is gone for the day but can take a message     Lafonda Mosses can be reach back at [phone]- 7am to 6pm [place] time    If she does not answer OK to leave a message, and will CB if VM left    Lafonda Mosses wishes to clear this up with [facility] soon

## 2022-01-01 NOTE — Telephone Encounter
PDL Call to Clinic    Reason for Call:[employer] company, patient, and [facility] are all on the phone asking for a prescription to be sent to [facility]. Patient says she has been getting her medication for 14 years with [facility] and doesn't understand the issue now.    Appointment Related?  []  Yes  []  No     If yes;  Date:  Time:    Call warm transferred to PDL: [x]  Yes  []  No    Call Received by Clinic Representative: Adriana    If call not answered/not accepted, call received by Patient Services Representative:

## 2022-01-02 ENCOUNTER — Inpatient Hospital Stay: Payer: BLUE CROSS/BLUE SHIELD

## 2022-01-02 ENCOUNTER — Telehealth: Payer: BLUE CROSS/BLUE SHIELD

## 2022-01-02 DIAGNOSIS — R001 Bradycardia, unspecified: Secondary | ICD-10-CM

## 2022-01-02 DIAGNOSIS — Z950 Presence of cardiac pacemaker: Secondary | ICD-10-CM

## 2022-01-02 NOTE — Telephone Encounter
Called and left a message to follow up on status of her sildenafil. Last spoke changed to a local pharmacy to use a coupon code as [facility] was too expensive and requesting Brand, which pt does not qualify for brand only and pt states she does not need brand only.

## 2022-01-02 NOTE — Procedures
SEE MEDIA SECTION FOR DOWNLOAD PRINTOUT     Ann Duran is a 46 y.o. female  [phone]  Reading MD:  Dr Carollee Herter    A Medtronic Carelink remote monitor was received as a patient initiated download Due to complaints of: flu-like symptoms for the past ''couple of weeks'', waxes and wanes. Now with vague mid to lower back achy pain, and feels she needs to ''bend over to get a breath'' x3 days. Did go for a long walk over the weekend, denies any symptoms at that time, but does feel overall she is tiring more easily. Denies lower extremity and abdominal swelling, does not regularly weigh herself, does not need to elevate at night to sleep, but states she struggles to fall asleep and this has worsened over the past year.     Of note her Revatio was changed to Sildenafil ~1 week ago Joni Reining has been helping with this).     Patient informed information forwarded to [facility].     See Media for full report.

## 2022-01-02 NOTE — Telephone Encounter
Spoke with patient regarding unscheduled Carelink transmission.  Patient with complaints of: flu-like symptoms for the past ''couple of weeks'', waxes and wanes. Now with vague mid to lower back achy pain, and feels she needs to ''bend over to get a breath'' x3 days. Did go for a long walk over the weekend, denies any symptoms at that time, but does feel overall she is tiring more easily. Denies lower extremity and abdominal swelling, does not regularly weigh herself, does not need to elevate at night to sleep, but states she struggles to fall asleep and this has worsened over the past year.     Of note her Revatio was changed to Sildenafil ~1 week ago Joni Reining has been helping with this).     Patient informed information forwarded to [facility].

## 2022-01-10 NOTE — Telephone Encounter
PDL Call to Clinic    Reason for Call: Lafonda Mosses from [facility] calling for update on medication sildenafil    Appointment Related?  [x]  Yes  []  No     If yes;  Date:  Time:    Call warm transferred to PDL: [x]  Yes  []  No    Call Received by Clinic Representative: Adrianna    If call not answered/not accepted, call received by Patient Services Representative:

## 2022-01-10 NOTE — Telephone Encounter
I spoke to Lafonda Mosses  We will complete PA for brand after pt lets [place] know what SE she is having.

## 2022-01-10 NOTE — Telephone Encounter
Lafonda Mosses would like to speak to [place] or [place]. Patient called Lafonda Mosses upset earlier    Patient does not want the generic medication , and wants the brand name due to the side effects.    Lafonda Mosses would like to speak to either Joni Reining or [employer] to get this approved    Patient stated she has been talking to both nurses, and they know whats been happening with medication/ coverage.     Lafonda Mosses stated if her phone goes to VM to please leave a detailed message and will call back asap. Lafonda Mosses would like to get this cleared today. Patient has been calling her line for an update    [phone]

## 2022-02-07 ENCOUNTER — Inpatient Hospital Stay: Payer: BLUE CROSS/BLUE SHIELD

## 2022-02-07 DIAGNOSIS — R001 Bradycardia, unspecified: Secondary | ICD-10-CM

## 2022-02-07 DIAGNOSIS — Z950 Presence of cardiac pacemaker: Secondary | ICD-10-CM

## 2022-02-08 NOTE — Procedures
SEE MEDIA SECTION FOR DOWNLOAD PRINTOUT     Ann Duran is a 45 y.o. female  [id number]  Reading MD:  Dr Shyheem Whitham    A Medtronic Carelink remote monitor was received as a routine download. See Media for full report.

## 2022-03-30 ENCOUNTER — Ambulatory Visit: Payer: BLUE CROSS/BLUE SHIELD

## 2022-03-30 MED ORDER — SILDENAFIL CITRATE 20 MG PO TABS
20 mg | ORAL_TABLET | Freq: Three times a day (TID) | ORAL | 3 refills | Status: AC
Start: 2022-03-30 — End: ?

## 2022-04-18 ENCOUNTER — Telehealth: Payer: BLUE CROSS/BLUE SHIELD

## 2022-04-18 NOTE — Telephone Encounter
Pt needs assistance in rescheduling her appts. Transferred to [place].

## 2022-04-18 NOTE — Telephone Encounter
Call Back Request      Reason for call back:  pt looking to r/s ultrasound, echo, and md return appt all at once. pt states Greig Castilla was able to schedule all of them. [facility] cant schedule [place]    CB: [phone]    Any Symptoms:  []  Yes  [x]  No       If yes, what symptoms are you experiencing:    o Duration of symptoms (how long):    o Have you taken medication for symptoms (OTC or Rx):      If call was taken outside of clinic hours:    [] Patient or caller has been notified that this message was sent outside of normal clinic hours.     [] Patient or caller has been warm transferred to the physician's answering service. If applicable, patient or caller informed to please call back if symptoms progress.  Patient or caller has been notified of the turnaround time of 1-2 business day(s).

## 2022-04-18 NOTE — Telephone Encounter
Patient decided to keep appointment. 

## 2022-05-09 ENCOUNTER — Inpatient Hospital Stay: Payer: BLUE CROSS/BLUE SHIELD | Attending: Pediatric Cardiology

## 2022-05-09 DIAGNOSIS — Z950 Presence of cardiac pacemaker: Secondary | ICD-10-CM

## 2022-05-09 DIAGNOSIS — R001 Bradycardia, unspecified: Secondary | ICD-10-CM

## 2022-05-09 NOTE — Procedures
SEE MEDIA SECTION FOR DOWNLOAD PRINTOUT     Ann Duran is a 45 y.o. female  [id number]  Reading MD:  Dr Shannon    A Medtronic Carelink remote monitor was received as a routine download. See Media for full report.

## 2022-05-10 ENCOUNTER — Telehealth: Payer: BLUE CROSS/BLUE SHIELD

## 2022-05-10 NOTE — Telephone Encounter
Attempted to return patient's call. Transmission shows normal device function, no episodes noted and battery with ~2 years until ERI. Encouraged patient to call for any further questions, phone number [phone] provided.

## 2022-05-25 ENCOUNTER — Inpatient Hospital Stay: Payer: BLUE CROSS/BLUE SHIELD | Attending: Cardiovascular Disease

## 2022-05-25 ENCOUNTER — Ambulatory Visit: Payer: BLUE CROSS/BLUE SHIELD | Attending: Cardiovascular Disease

## 2022-05-25 DIAGNOSIS — Q201 Double outlet right ventricle: Secondary | ICD-10-CM

## 2022-05-25 DIAGNOSIS — I2721 Secondary pulmonary arterial hypertension: Secondary | ICD-10-CM

## 2022-05-25 DIAGNOSIS — Z9889 Other specified postprocedural states: Secondary | ICD-10-CM

## 2022-05-25 DIAGNOSIS — Q204 Double inlet ventricle: Secondary | ICD-10-CM

## 2022-05-25 DIAGNOSIS — Z950 Presence of cardiac pacemaker: Secondary | ICD-10-CM

## 2022-05-25 MED ORDER — SPIRONOLACTONE 50 MG PO TABS
150 mg | ORAL_TABLET | Freq: Every day | ORAL | 3 refills | Status: AC
Start: 2022-05-25 — End: ?

## 2022-05-25 NOTE — Progress Notes
[facility]     Date of Visit: 05/25/2022   Reason for Visit: Follow up s/p cardiac catheterization on 08/08/17, in setting of DORV s/p Fontan conversion to extracardiac baffle on 11/21/07, and subsequent placement of epicardial permanent pacemaker.     HPI: Ann Duran is a 46 y.o. woman with the following cardiac history:  1. 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  2. Underwent modified Fontan procedure (patch connection of the IVC/SVC via the right atrium to MPA) at age 82 yrs at the [facility].  3. Postoperative chylothorax [phone], subsequently requiring exploration by a left thoracotomy, with ligation of lymphatic vessel and thoracic duct.  4. Suffered fatigue and exercise intolerance in November 2008, and was found to be in atrial fibrillation.  5. Underwent cardioversion at [facility] by Dr. Charlesetta Garibaldi on 10/14/07, along with cardiac cath which revealed excellent RA/Fontan pressures with a mean of 12-40mmHg.  6. Underwent Fontan conversion on 11/21/07, involving takedown of previous Fontan and placement of an extracardiac Fontan and bidirectional Glenn shunt and placement of epicardial permanent pacing leads. Discharged on 11/29/07  7. Re-admitted on 12/03/07 for thoracentesis of large right pleural effusion, in the setting of bradycardia and junctional rhythm  8. Underwent surgical placement of abdominal pacemaker generator on 12/05/07, but atrial lead was non-capturing, left with single site ventricular pacing.  9. Discharged on 12/12/07, after aggressive diuresis in the setting of significant ascites and perineal edema.  10. Subsequent diuresis over the next 6 weeks with resolution of ascites on moderate dose lasix and moderate dose aldactone.  11. Underwent left hemidiaphragm plication by Dr. Larwance Sachs on 03/02/2009, unsuccessful attempt at placement of an atrial lead.  12. PPM rate set at VVI 90 bpm during the hospitalization, reduced to 60 bpm by Dr. Carollee Herter on 03/29/2009.  13. In the setting of poor chronotropic response to exercise, rate response activated by Dr. Carollee Herter in July 2011 with significantly improved exercise ability.  14. Underwent placement of Mirena IUD and D & C by Dr. Ardyth Man Parvatenini in February 2012. No cardiovascular complications.  Mirena IUD replaced with LEEP procedure in January 2018  15. Good functional capacity with MVO2 of 24.8 (88% predicted) in August 2016.  Low normal single RV function, improves with exercise.  No exercise induced arrhythmias  16. V-paced 99% of time at rate of 70, generator nearing ERI as of May 2018 remote check, requiring monthly checks to detect ERI.   17. Liver US in Sept 2017 showed no lesions but METAVIR score was 4 consistent with cirrhosis.  18. Underwent cardiac cath on 08/08/17 by Dr. Tobie Poet, showing mean Fontan pressures of , and liver biopsy showing extensive bridging fibrosis, but no cirrhosis.  Spirinolactone dose was doubled from 25mg  daily to 50mg  daily in an attempt to reduce Fontan volume pressure.  19. Underwent pacemaker generator replacement ([employer]tr[employer]) on 12/12/17 by Dr. Larwance Sachs  20. Moved to [place] for work in early 2020, had issues with the humidity, insomnia and had some exertional decline.  21.  06/01/2021  cardiac catheterization & Liver Biopsy  by Dr. 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     Interval History: Ann Duran presents today for a follow up. She was last seen by Dr. Tobie Poet on 01/17/2021 via telemedicine visit.  She is followed by Dr. Adriana Simas in [place] and underwent hemodynamic cath 06/01/2021 with mean fontan pressure of 11 mmHg, she was noted to have an persistent left sided SVC draining into the coronary sinus that was closed as above.     Today she reports 3 months of abdominal distension and early satiety. Her weight has increased 2 lbs since December 2022, She is still active, walks 2-3 miles and enjoys taking gym classes (HIIT). She has not attempted to increase her diuretics, she is adherent to a mostly low salt diet.     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 She visits [facility] frequently. She currently has an IUD in place.     Exercise: Walking, yoga or HIT classes for 20-30 minutes but sporadic in frequency.   Dental: Last seen earlier this year. Does take SBE prophylaxis.       Past Medical History: As above.  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 Darlyn Chamber here at [facility].  LEEP in Jan 2018.  Mirena IUD replaced in Jan 2018.  Nose bleeds s/p bipolar cauterization 10/2016    Allergies: No Known Allergies     Medications:   Medications that the patient states to be currently taking   Medication Sig   ? Ascorbic Acid (VITAMIN C) 1000 MG tablet Take 1 tablet (1,000 mg total) by mouth as needed for.   ? aspirin 81 mg EC tablet Take 1 tablet (81 mg total) by mouth daily.   ? clonazePAM 0.5 mg tablet TAKE 1 TABLET(0.5 MG) BY MOUTH AT NIGHT AS NEEDED FOR INSOMNIA   ? cyanocobalamin 1000 mcg tablet Take 1 tablet (1,000 mcg total) by mouth as needed for.   ? fluticasone 50 mcg/act nasal spray    ? furosemide 80 mg tablet Take 1 tablet (80 mg total) by mouth daily.   ? lisinopril 2.5 mg tablet Take 1 tablet (2.5 mg total) by mouth daily.   ? pantoprazole 20 mg DR tablet TAKE 1 TABLET BY MOUTH  DAILY AS NEEDED.   ? potassium chloride 10 mEq CR tablet Take 1 tablet (10 mEq total) by mouth daily.   ? sildenafil 20 mg tablet Take 1 tablet (20 mg total) by mouth three (3) times daily.   ? spironolactone 50 mg tablet Take 2 tablets (100 mg total) by mouth daily.   ? valacyclovir 500 mg tablet ValACYclovir HCl - 500 MG Oral Tablet   ? zolpidem 5 mg tablet Take 0.5 tablets (2.5 mg total) by mouth at bedtime as needed for Sleep.   ? [DISCONTINUED] ibuprofen 200 mg tablet Take 2 tablets (400 mg total) by mouth every six (6) hours as needed.   ? [DISCONTINUED] sildenafil (REVATIO) 20 mg tablet Take 1 tablet (20 mg total) by mouth three (3) times daily.         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  Nonsmoker, occas ETOH.    Family History: Mother and father in their 42s in good health. Siblings, she has1 brother who is in good health. There is no history of congenital heart disease in the family.    ROS: Negative and noncontributory except as noted above in HPI and Interval Events.     Physical Exam:  VS: BP 108/63 (BP Location: Left arm, Patient Position: Sitting, Cuff Size: Regular)  ~ Pulse 74  ~ Temp 36.7 ?C (98.1 ?F) (Forehead)  ~  Resp 16  ~ Ht 5' 5'' (1.651 m)  ~ Wt 137 lb 1.6 oz (62.2 kg)  ~ SpO2 99% Comment: room air ~ BMI 22.81 kg/m?    General: Pleasant woman, in NAD.  Skin: No rashes. Sternotomy and left thoracotomy scars well healed.  Head and Neck: Pupils are equal and reacting.   Mouth: Normal oral mucosa. Excellent dentition  Neck:Jugular venous pressure ~12cm  Chest:  Lungs clear bilaterally   Heart: Single S1 with no systolic murmurs or rubs. No diastolic murmurs or gallops.  Abdomen: Mildly distended, no fluid wave, nontender, non tympanitic, mild distended veins noted.   Extremities: No edema, mild varicosity on inner thighs and lower right leg.   Neurologic: Alert and oriented x 4.  Psychologic: Normal mood and affect.    Cardiac Diagnostic Data:    Abdominal Ultrasound 05/25/2022  CLINICAL HISTORY: s/p Fontan, please assess for nodules, lesions and absesses. ? COMPARISON: Abdominal ultrasound dated 05/23/2018. ? TECHNIQUE: Real time grayscale and color Doppler images of the abdominal organs were obtained using a curved transducer. ? FINDINGS: ? Pancreas: Partially visualized and unremarkable. ? Liver: Coarse in echotexture with irregular surface contour. ? Focal liver lesions: None. ? Portal vein: Normal, hepatopetal flow. ? Gallbladder: Normally distended, containing gallstones. No gallbladder wall thickening. No sonographic Murphy's sign. ? Bile ducts: Normal in caliber. ? Kidneys: Normal in size with normal cortical thickness and echogenicity. No hydronephrosis. ? Spleen: Splenomegaly. ? Ascites: None in the upper abdomen. ? Visualized proximal aorta and IVC: Unremarkable. ? MEASUREMENTS: Liver: 14.6 cm Common Duct: 4.8 mm Right Kidney: 11.5 cm Left Kidney: 12.1 cm Spleen: 17.8 cm Aorta: 1.6 cm ?    IMPRESSION: ?  1. Coarsening of hepatic echotexture with irregularity in surface contour suggesting fibrosis/cirrhosis. No suspicious liver lesions. ?  2. Cholelithiasis without evidence of cholecystitis. ?  3. Splenomegaly suggesting portal hypertension.     Echocardiogram 05/25/2022  PHYSICIAN INTERPRETATION: CONOTRUNCAL ANATOMY: The cardiac structural malformations are consistent with a diagnosis of levo-transposition of the great arteries. LEFT VENTRICLE: Patient demonstrated normal sinus rhythm during echocardiogram. 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 RIGHT ATRIUM: Large secundum ASD. Status post extracardiac Fontan. Visualized portions of the Fontan appear patent, no obvious fenestration noted. Sherrine Maples shunt appears widely patent with low velocity flow noted. MITRAL VALVE: Trace mitral valve regurgitation. TRICUSPID VALVE: Trace tricuspid valve regurgitation. AORTIC VALVE: No evidence of aortic valve regurgitation. AORTA: No coarctation of the aorta. PERICARDIUM: There is no evidence of pericardial effusion. CONCLUSIONS  1. 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  2. 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 Low normal systemic RV systolic function.  3. Large secundum ASD. Status post extracardiac Fontan. Visualized portions of the Fontan appear patent, no obvious fenestration noted. Sherrine Maples shunt appears normal.  4. L-transposition of the great arteries.  5. A prior echo performed on 08/09/2020 was reviewed for comparison. No significant changes noted since the previous study.    06/01/2021 Cardiac Catheterization [facility]- Dr. Bradd Canary                 Liver Biopsy 06/01/2021 [facility]- Dr. Bradd Canary       Echocardiogram 08/09/20- reviewed by me:  1. 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   2. Double out right ventricle with hypoplastic LV and large VSD with'' functional single ventricle'' Right ventricular hypertrophy. Low normal systemic RV systolic function.  3. Large secundum ASD. Status post extracardiac Fontan. Visualized portions of the Fontan appear patent, no obvious fenestration noted. Sherrine Maples shunt appears normal.   4. L-transposition of the great arteries.   5. Large secundum ASD. Status post extracardiac Fontan. Visualized portions of the Fontan appear patent, no obvious fenestration noted. Sherrine Maples shunt appears widely patent with low velocity flow noted.   6. A prior echo performed on 07/29/2019 was reviewed for comparison. No significant changes noted since the previous study.    Pacemaker remote transmission 05/07/20:  4.9 years longevity, VVIR mode, device function normal, one episode of possible NSVT (3 beats) on 6/12 after walking 5K, asymptomatic, 99% V pacing.    Echocardiogram 07/29/2019:  2D AND M-MODE MEASUREMENTS (normal ranges within parentheses): Aorta/Left Atrium: Aortic Root, d (2D): 3.0 cm Aortic Valve: AoV Max Vel: 0.84 m/s AoV Peak PG: 3 mmHg AoV Mean PG: Tricuspid Valve and PA/RV Systolic Pressure: RA Pressure: 8 mmHg Aorta: Ao Asc: 2.4 cm Ao Arch: 2.2 cm Ao Desc: 1.7 cm   PHYSICIAN INTERPRETATION: CONOTRUNCAL ANATOMY: The cardiac structural malformations are consistent with a diagnosis of levo-transposition of the great arteries. LEFT VENTRICLE: Patient demonstrated normal sinus rhythm during echocardiogram. 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 RIGHT VENTRICLE: Double out right ventricle with hypoplastic LV and large VSD with '' functional single ventricle'' Right ventricular hypertrophy. Low normal systemic RV systolic function. RIGHT ATRIUM: Right atrial pressure is estimated at 8 mmHg. Right atrial pressure is estimated at 8 mmHg. Large secundum ASD. Status post extracardiac Fontan. Visualized portions of the Fontan appear patent, no obvious fenestration noted. Sherrine Maples shunt appears normal. MITRAL VALVE: No evidence of mitral valve regurgitation. TRICUSPID VALVE: Trace tricuspid valve regurgitation. AORTIC VALVE: The aortic valve was not well visualized. Aorta is anterior of the pulmonic valve. No evidence of aortic valve regurgitation. PULMONIC VALVE: The pulmonic valve was not well visualized. AORTA: No coarctation of the aorta. PULMONARY ARTERY: The pulmonary artery is not well seen. SYSTEMIC VEINS: The inferior vena cava is normal in size and exhibits less than 50% respiratory change. PERICARDIUM: There is no evidence of pericardial effusion. CONCLUSIONS :  1. L-transposition of the great arteries.  2. 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 Low normal systemic RV systolic function.  3. Double outlet right ventricle-doubly commited ventricular septal defect-aorta anterior to pulmonary artery-no left ventricular outflow tract obstruction.  4. 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  5. Large secundum ASD. Status post extracardiac Fontan. Visualized portions of the Fontan appear patent, no obvious fenestration noted. Sherrine Maples shunt appears normal.  6. A prior echo performed on 05/13/2018 was reviewed for comparison. No significant changes noted since the previous study.    CAM  Monitor 04/12/17:  Predominant rhythm: Paced  ? Paced rhythm  ? Ventricular Ectopy = (<1%)  1802 isolated, unifocal PVCs and 208 pairs  ? Atrial Ectopy = (<1%)  1 isolated PAC  Predominantly V paced rhythm with rare supraventricular beats  (most are labeled PVC's). Symptoms of ''Chest discomfort'  correlated with 2 narrow complex beats/. Symptoms of  Dizziness/Lightheadedness correlated with ventricular pacing.  One of four button presses correlated with a narrow complex  beat, the remaining button presses correlated with ventricular  pacing.    Echocardiogram:  05/13/18  PHYSICIAN INTERPRETATION:  CONOTRUNCAL ANATOMY: The cardiac structural malformations are consistent with a diagnosis of levo-transposition of the great arteries.  LEFT VENTRICLE: Patient demonstrated normal sinus rhythm during  echocardiogram. 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  RIGHT VENTRICLE: Double out right ventricle with hypoplastic LV and large VSD with '' functional single ventricle'' Right ventricular hypertrophy. Low normal systemic RV systolic function.  LEFT ATRIUM:  RIGHT ATRIUM: Right atrial pressure is estimated at 8 mmHg. Right atrial pressure is estimated at 8 mmHg. Large secundum ASD. Status post extracardiac Fontan. Visualized portions of the Fontan appear patent, no obvious fenestration noted. Sherrine Maples shunt   appears normal.  MITRAL VALVE: No evidence of mitral valve regurgitation.  TRICUSPID VALVE: Trace tricuspid valve regurgitation.  AORTIC VALVE: The aortic valve was not well visualized. Aortic valve area was not quantified by continuity equation on this study. Aorta is anterior of the pulmonic valve.  No evidence of aortic regurgitation.  PULMONIC VALVE: No indication of pulmonic valve regurgitation. Pulmonic is posterior of the aorta.  AORTA: Normal aortic arch, normal descending aorta, normal mid ascending aorta and the aortic root is normal in size and structure. No coarctation of the aorta.  PULMONARY ARTERY: The pulmonary artery is not well seen.  SYSTEMIC VEINS: The inferior vena cava is normal in size and exhibits less than 50% respiratory change.  PERICARDIUM: There is no evidence of pericardial effusion.  CONCLUSIONS:   1. L-transposition of the great arteries.   2. 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 Low normal systemic RV systolic function.   3. Double outlet right ventricle-doubly commited ventricular septal defect-aorta anterior to pulmonary artery-no left ventricular outflow tract obstruction.   4. 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   5. Large secundum ASD. Status post extracardiac Fontan. Visualized portions of the Fontan appear patent, no obvious fenestration noted. Sherrine Maples shunt appears normal.   6. A prior echo performed on 05/07/2017 was reviewed for comparison. No significant changes noted since the previous study.    Stress Echo/CPX:  05/13/18  BASELINE ECG:   Ventricular paced with biphasic T wave in lead V2  PROTOCOL: Bruce. (Treadmill advanced to stage 3 on its own during stage 2 of exercise after 1')  CONTROL:   HR:  72    BP: 110/64     O2 Sat:  98% (forehead) 92% (finger)   START:  1.7 MPH at 10% grade.   STOP: 11.4 METS with 3.4 mph at 14% grade x 2?37 after 7?37'' total exercise time due to shortness of breath.  Peak HR: 129      Peak BP: 128/66         Peak O2 Sat:  94%   Peak Double-Product: 16,512    Maximum VO2 = 1391 ml which is 77% of predicted, maximum VO2/kg = 23.7 ml/kg which is 85% of predicted (Note: The reference values are adjusted for weight and modality of exercise)  VE/VO2 =  40   VE/VCO2 = 36      VE/VCO2 slope = 30.1  VO2/HR =11.1 which is 111% predicted  Breathing Reserve = 41  RQ= 1.12  Anaerobic Threshold was reached at HR = 103 VO2 =  1144 ml     VO2/kg =  19.5 ml/kg  RESULTS:   Symptoms:  Shortness of breath and leg fatigue, denied chest pain  ST-T Changes: Uninterruptable in the presence of ventricular pacing.  Dysrhythmias:  Rare PVCs during exercise  BP Response:  Blunted.  IMPRESSIONS: Fair exercise tolerance achieving 71% maximum predicted heart rate, limited by shortness of breath. ST changes during exercise are uninterpretable in  the presence of ventricular pacing. No chest pain. Rare PVCs during exercise. Blunted BP at a low double product. Maximum VO2/kg = 23.7 ml/kg which is 85% of predicted. VE/VCO2 slope = 30.1 which is 119% of predicted. Compared with previous stress/CPX of 06/21/15 exercise tolerance has decreased from 12.7 METS, MVO2/kg had been 24.8, VE/VCO2 slope had been 23.1.  BASELINE:  Baseline: Single ventricle with low normal systolic function.  ADDITIONAL BASELINE FINDINGS:  LEFT VENTRICLE: Global left ventricular systolic function is mildly decreased (LVEF 40-49%).  MITRAL VALVE: Trace mitral valve regurgitation.  TRICUSPID VALVE: Trace tricuspid valve regurgitation.     Global left ventricular function increased appropriately with stress. No new segmental wall motion abnormalities were seen. Global left ventricular systolic function at peak stress is lower limits of normal (LVEF 50-55%). Mild mitral valve regurgitation.   Mild tricuspid regurgitation. Post-exercise: mild increase in single ventricle contractility.     SUMMARY:   1. Please see separately resulted cardiopulmonary exercise test for details of exercise performance.   2. DORV, L-TGA, hypoplastic LV, large VSD, large ASD.   3. Baseline: Single ventricle with low normal systolic function.   4. Post-exercise: mild increase in single ventricle contractility    Liver US:  05/13/18  IMPRESSION:  1. Irregular liver contour and coarsened in echotexture representing underlying chronic liver disease/fibrosis. No focal mass. Cholelithiasis. Splenomegaly supports portal hypertension. Normal liver Doppler.  2. Shear wave liver stiffness measurement indicating moderate to severe fibrosis (MetaVir stages F2-F3). Note, the morphologic appearance of the liver appears more fibrotic than Elastography measurements indicate.    Cardiac Cath:  08/08/17:      Liver US  07/01/17 (outside report) shows no lesions, Metavir score of 4.    Echocardiogram:  05/07/17 PHYSICIAN INTERPRETATION:  CONOTRUNCAL ANATOMY: The cardiac structural malformations are consistent with a diagnosis of levo-transposition of the great arteries.  LEFT VENTRICLE: Visually estimated left ventricular ejection fraction 55-60%. Patient demonstrated normal sinus rhythm during echocardiogram. 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  RIGHT VENTRICLE: Double out right ventricle with hypoplastic LV and large VSD with '' functional single ventricle'' Right ventricular hypertrophy. Low normal systemic RV systolic function.  RIGHT ATRIUM: Right atrial pressure is estimated at 8 mmHg. Right atrial pressure is estimated at 8 mmHg. Large secundum ASD. Status post extracardiac Fontan. Visualized portions of the Fontan appear patent, no obvious fenestration noted. Sherrine Maples shunt   appears normal.  MITRAL VALVE: No evidence of mitral valve regurgitation.  TRICUSPID VALVE: Mild tricuspid valve regurgitation.  AORTIC VALVE: The aortic valve was not well visualized. Aorta is anterior of the pulmonic valve.  No evidence of aortic regurgitation.  PULMONIC VALVE: No indication of pulmonic valve regurgitation. Pulmonic is posterior of the aorta.  AORTA: Normal aortic arch and normal descending aorta. The ascending aorta was not well visualized. Mildly enlarged aortic root (4.2 cm). No coarctation of the aorta.  PULMONARY ARTERY: The pulmonary artery is not well seen.  SYSTEMIC VEINS: The inferior vena cava is normal in size and exhibits less than 50% respiratory change.  PERICARDIUM: There is no evidence of pericardial effusion.  CONCLUSIONS:   1. L-transposition of the great arteries.   2. Double outlet right ventricle-doubly commited ventricular septal defect-aorta anterior to pulmonary artery-no left ventricular outflow tract obstruction.   3. 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 Low normal systemic RV systolic function.   4. Large secundum ASD. Status post extracardiac Fontan. Visualized portions  of the Fontan appear patent, no obvious fenestration noted. Sherrine Maples shunt appears normal.   5. Left ventricular ejection fraction is approximately 55-60%.   6. Mildly enlarged aortic root (4.2 cm).   7. Mild tricuspid valve regurgitation.   8. A prior echo performed on 03/09/2016 was reviewed for comparison. No significant changes noted since the previous study.   9. 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     ECG 11/07/16: Ventricular paced. Ventricular rate of 75 bpn, QRS duration 184 ms. QT/QTc 498/556 ms    Liver Ultrasound 07/27/16 per report from OSH: Liver 17.5 cm in length. It is normal in echodensity. Liver contour is smooth. No focal lesion os identified. Hepatopedal flow is identifies in the main, right, and left portal veins. Gallbladder and biliary tree: There are several small mobile gallstones. There is no focal tenderness. The gallbladder wall is thickened measuring 0.7 cm. There is no pericholecystic fluid. No intra or extrahepatic biliary ductal dilatation.      Echocardiogram 03/09/16: CONOTRUNCAL ANATOMY: The cardiac structural malformations are consistent with a diagnosis of levo-transposition of the great arteries. The observed structural malformations are consistent with the diagnosis of double outlet right ventricle with doubly?commited ventricular septal defect. The aorta is anterior to the pulmonary artery. There is no sub-aortic obstruction of left ventricular outflow tract. LEFT VENTRICLE: Normal left ventricular size. Visually estimated left ventricular ejection fraction 55-60%. Small left ventricular size. RIGHT VENTRICLE: (DTI 6.0 cm/s). 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 Low normal systemic RV systolic function. LEFT ATRIUM: Mild left atrial enlargement. RIGHT ATRIUM: Right atrial pressure is estimated at 8 mmHg. Large secundum ASD. Status post extracardiac Fontan. Visualized portions of the Fontan appear patent, no obvious fenestration noted. Sherrine Maples shunt appears normal. MITRAL VALVE: No evidence of mitral valve regurgitation. TRICUSPID VALVE: Mild tricuspid valve regurgitation. AORTIC VALVE: The aortic valve is trileaflet and structurally normal, with normal leaflet excursion. Aorta is anterior of the pulmonic valve. No evidence of aortic regurgitation. PULMONIC VALVE: No indication of pulmonic valve regurgitation. Pulmonic is posterior of the aorta. AORTA: Normal mid ascending aorta and normal aortic arch. Mildly enlarged aortic root (3.9 cm). SYSTEMIC VEINS: The inferior vena cava is normal in size and exhibits less than 50% respiratory change. PERICARDIUM: There is no evidence of pericardial effusion. ?? CONCLUSIONS: ?1. 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 Low normal systemic RV systolic function. ?2. Left ventricular ejection fraction is approximately 55-60%. ?3. Double outlet right ventricle. ?4. Double outlet right ventricle ??? -doubly commited ventricular septal defect ??? -aorta anterior to pulmonary artery ??? -no left ventricular outflow tract obstruction. ?5. L-transposition of the great arteries. ?6. Mild left atrial enlargement. ?7. Large secundum ASD. Status post extracardiac Fontan. Visualized portions of the Fontan appear patent, no obvious fenestration noted. Sherrine Maples shunt appears normal. ?8. Mild tricuspid valve regurgitation. ?9. Mildly enlarged aortic root (3.9 cm).    Abdominal US 06/21/15:  The pancreas is partially visualized and grossly unremarkable. The liver is mildly heterogeneous in echogenicity. There is no focal liver lesion. Portal vein demonstrates hepatopetal flow. No intra- or extrahepatic biliary dilation. The common bile duct is normal in caliber. The gallbladder is normally distended, containing gallstones. No gallbladder wall thickening or pericholecystic fluid. No sonographic Murphy's sign. The spleen is mildly enlarged. The kidneys are normal in size. Both kidneys demonstrate normal cortical thickness and echogenicity. No  hydronephrosis. There is no ascites. The visualized proximal aorta and IVC are unremarkable. MEASUREMENTS: Liver: 15.1 cm Common Duct: 3 mm Right Kidney: 10.5 cm Left Kidney: 11.5 cm Spleen: 17 cm Aorta: 1.1 cm SHEAR WAVE LIVER STIFFNESS MEASUREMENTS: Mean 1.63 +/-0.12 m/sec, Median 1.66 m/sec, equating to 5.7-12 kPA. REFERENCE (m/s, kPa): Normal: 0.81-1.22 m/s 2.0-4.5 kPa (METAVIR F0) Normal/mild: 1.22-1.37 m/s 4.5-5.7 kPa (METAVIR F0-F1) Mild/moderate: 1.37-2 m/s 5.7-12.0 kPa (METAVIR F2-F3) Moderate/severe: 2-2.64 m/s 12.0-21.0 kPa (METAVIR F3-F4) Severe: >2.64 m/s >21.0 kPa (METAVIR F4) IMPRESSION: 1. Heterogenous liver echogenicity, suggesting diffuse liver disease. No suspicious liver lesions. Patent hepatic vasculature. 2. Shear wave liver stiffness measurement indicating mild/moderate liver fibrosis, MetaVir score of F2-3. 3. Splenomegaly. 4. Cholelithiasis without evidence of acute cholecystitis.    Echocardiogram 06/21/15:  2D AND M-MODE MEASUREMENTS (normal ranges within parentheses): Aorta/Left Atrium: Aortic Root, d (2D): 2.8 cm LV DIASTOLIC FUNCTION: MV Peak E: 0.98 m/s E/e' Ratio: 40.2 LV IVRT: 134 msec Decel Time: 229 msec Right Ventricle: TAPSE: 1.0 cm Aortic Valve: AoV Max Vel: 1.05 m/s AoV Peak PG: 4 mmHg AoV Mean PG: Tricuspid Valve and PA/RV Systolic Pressure: TR Max Velocity: 3.2 m/s RA Pressure: 8 mmHg RVSP/PASP: 49 mmHg Pulmonic Valve: PV Max Velocity: 0.8 m/s PV Max PG: 2 mmHg PV Mean PG: Aorta: Ao Asc: 2.4 cm. PHYSICIAN INTERPRETATION: CONOTRUNCAL ANATOMY: The cardiac structural malformations are consistent with a diagnosis of levo-transposition of the great arteries. 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 The aorta is anterior to the pulmonary artery. There is no sub-aortic obstruction of left ventricular outflow tract. LEFT VENTRICLE: Visually estimated left ventricular ejection fraction 55-60%. Small left ventricular size. RIGHT VENTRICLE: TAPSE 1.0 cm, (DTI 6.0 cm/s). 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 Low normal systemic RV systolic function. RIGHT ATRIUM: Right atrial pressure is estimated at 8 mmHg. Large secundum ASD. Status post extracardiac Fontan. Visualized portions of the Fontan appear patent, no obvious fenestration noted. Unable to obtain images of Glenn shunt. MITRAL VALVE: No evidence of mitral valve regurgitation. TRICUSPID VALVE: Mild tricuspid valve regurgitation. Tricuspid regurgitation velocity is not well seen. AORTIC VALVE: The aortic valve is trileaflet and structurally normal, with normal leaflet excursion. Aorta is anterior of the pulmonic valve. No evidence of aortic regurgitation. PULMONIC VALVE: No indication of pulmonic valve regurgitation. Pulmonic is posterior of the aorta. AORTA: The aortic root is normal in size and structure, normal mid ascending aorta and normal aortic arch. SYSTEMIC VEINS: The inferior vena cava is not well visualized. PERICARDIUM: There is no evidence of pericardial effusion. CONCLUSIONS: 1. Small left ventricular size 2. 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 3. L-transposition of the great arteries. 4. Mild tricuspid valve regurgitation. 5. 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 Low normal systemic RV systolic function.     CPEX/Echo 06/11/15:  BASELINE: Low normal systemic RV systolic function at rest. Hypoplastic LV. ADDITIONAL BASELINE FINDINGS: MITRAL VALVE: Trace mitral valve regurgitation. TRICUSPID VALVE: Trace tricuspid valve regurgitation. No new segmental wall motion abnormalities were seen. Global left ventricular systolic function at peak stress is normal (LVEF 60-65%). Improved systemic RV systolic function with stress. No increase in degree of atrioventricular valve regurgitation with exercise. SUMMARY: 1. Improved systemic RV systolic  function with stress. No increase in degree of atrioventricular valve regurgitation with exercise. 2. Low normal systemic RV systolic function at rest. Hypoplastic LV. BASELINE ECG: Ventricular paced rhythm PROTOCOL: Bruce. CONTROL: HR: 74 BP: 120/74 O2 Sat: 95 % START: 1.7 MPH at 10% grade. STOP: 12.7 METS with 4.2 mph at 16 % grade x 1?33'' after 10?33'' total exercise time due to shortness of breath. Peak HR: 141 Peak BP: 136/56 Peak O2 Sat: 95 % Peak Double-Product: 19,176 Maximum VO2 = 1386 ml which is 77 % of predicted, maximum VO2/kg = 24.8 ml/kg which is 88 % of predicted (Note: The reference values are adjusted for weight and modality of exercise) VE/VO2 = 35 VE/VCO2 = 31 VE/VCO2 slope = 23.1 VO2/HR = 9.8 which is 99 % predicted Breathing Reserve = 50 RQ= 1.13 Anaerobic Threshold was reached at HR = 103 VO2 = 1094 ml VO2/kg = 19.6 ml/kg RESULTS: Symptoms: Shortness of breath and leg fatigue; no chest pain or discomfort. ST-T Changes: Uninterpretable due to paced rhythm. Dysrhythmias: Rare PVC during exercise BP Response: Appropriate. IMPRESSIONS: Good exercise tolerance achieving 77 % maximum predicted heart rate, limited by shortness of breath. Exercise induced ST changes are uninterpretable due to paced rhythm. No exercise induced chest pain at a low double product. Rare PVCs as described above. Maximum VO2 = 1386 ml which is 77 % of predicted, maximum VO2/kg = 24.8 ml/kg which is 88 % of predicted . VE/VCO2 slope = 23.1 which is 92 % of predicted. Compared with previous stress/CPX of 04/24/13 maximum VO2/kg had been 25.0 at 11.9 METS    Echocardiogram:  04/21/13: CONCLUSIONS:  1. Double outlet right ventricle.  2. Moderately enlarged right ventricular size and low normal systolic function. 3. Moderately increased RV wall thickness. 4. Mild tricuspid regurgitation. 5. Status post extracardiac Fontan, appears widely patent, no fenestration noted. Sherrine Maples shunt not well visualized.    Labs:      05/02/2022 WBC 2.9 Hgb 13.1 Plts 90 Na 135  K 4.3 BUN 19 Cr 0.84 Alb 4.9 AST 24 ALT 19 ALP 74      Component 05/02/22 08/25/20 08/17/20 08/18/18   Auto WBC 2.9?Low? 3.0?Low? 2.8?Low? 5.80   RBC 4.46 4.98 4.93 5.11   Hemoglobin 13.1 14.6 14.1 15.0   Hematocrit 38.7 43.5 43.0 44.5   MCV 87 87 87 87.1   MCH 29.4 29.3 28.6 29.4   MCHC 33.9 33.6 32.8 33.7   RDW 12.3 12.1 12.1 13.8   Platelets 90?Low Panic?90 101?Low?  90?Low Panic?  89?Low?   Hematology Comments: Note:  Note:  Note:  --     08/18/18: WBC 5.8 RBC 5.11 Hgb 15 Hct 44.5 Plt 89 NA 139 K 5.3 BUN 16 Creat 0.81 Gluc 71 BNP 149 TSH 2.39  08/08/17: Creat 0.72, BUN 12, K 3.8  07/01/17:  Chol 121, trig 58, HDL 40, LDL 69, Hgb A1c 5.4, INR 1.2, prealbumin 24, TSH 3.6, Hct 43.2, Plat 82k  07/26/16: WBC 3.0, HGB 4.69, HCT 40.7, PLT 75, NA 137, K 3.8, BUN 13, CREAT 0.7, ALK PHOS 57, AST 30, ALT 38, TBil 1.3, GGT 100, PREALB 24, BNP 113, INR 1.2, TSH 4.24, FT3 3.6, FT4 1.05.  04/01/13:  Hgb 14.7, Hct 42.4, MCV 85.1, plat ct 106k, Na 143, K 4.2, creat 0.8, BUN 19, AST 36, ALT 32    Impressions:  1. DORV (although recent echos appear to show a DILV with single LVEF 50%, and earlier surgical notes describe her anatomy as DILV), L-malposed  great arteries: s/p RA-PA anastomosis Fontan at age 21 years, now s/p Extracardiac Fontan and bidirectional Sherrine Maples shunt on 11/21/07 by Dr. Richardo Hanks, including bi-atrial Maze procedure and placement of a permanent atrial pacing lead.  2. Postoperative junctional rhythm and bradycardia: re-admitted 4 days after discharge with large right pericardial effusion requiring thoracentesis and placement of an abdominal pacemaker generator and epicardial ventricular lead on 12/05/07 (non-functioning atrial lead).  3. Ascites, improved on aldactone. No evidence of ascites on annual abdominal US.  4. Longstanding history of irregular and heavy menses, thus far unsuccessfully regulated on several types of hormonal therapy. Better controlled on Mirena since 2012.  Also has cervical HPV.  Now with CIN III, underwent LEEP procedure in Jan 2018  5. History of atrial fibrillation, but no post Fontan revision tachyarrhythmias thus far, formerly on coumadin (possible side effect of hair loss), now on ASA.  6. Longstanding elevated left hemidiapragm, s/p successful plication on 03/02/2009 by Dr. Larwance Sachs.  7. Liver US in 2014, 2016 and 2017, and 2018 shows no lesions, but 2017 and 2018 outside liver US report Metavir score of 4, which is consistent with cirrhosis.  2019 liver US at [facility] showed MetaVir stage F2-F3, although elastography impacted by congestive hepatopathy.   8. Cardiac cath and liver biopsy on 08/08/17, with mean Fontan pressure under sedation of , and liver biopsy showing extensive bridging fibrosis but no cirrhosis.  Spirinolactone was increased from 25 to 50mg  to 100mg  after this cath  9. Ventricular pacemaker, pacing 99% of time.   Generator replaced on 12/12/17 by Dr. Larwance Sachs. Deferred placement of transvenous atrial lead via her LPA to minimize V pacing, based on Leelah's wish not to be committed to anticoagulation  10. Stable exercise capacity in July 2019, with MVO2 23.7 (85% predicted), previously 24.8 and 88% predicted in 2016    [facility]    Labs (BMP, BNP, LFTs, GGT, AFP, INR, CBC, Prealbumin):  []  Within last year (date: 07/29/2019)  [x]  Ordered today: to be done next week   []  Deferred:      Hep C screening  [x]  Prior HCV Ab negative (date: 06/21/15)  []  Not indicated (no surgery prior to early 1990s)  []  Ordered    Last hemodynamic cath:   [x]  Within last 10 years (date: 06/01/2021), see above results   []  Deferred. Reason:   []  Scheduled    Last Liver biopsy  [x]  Within last 10 years (date: 06/01/2021), see above results   []  Scheduled with cath    Liver Imaging:  []  MRI Abdomen within last 5 years (date: ), see above  []  Triple phase CT within last 5 years (date: ), see above  [x]  Liver ultrasound completed (date: 05/25/2022), see above  []  Ordered today, see above.     Advanced cardiac imaging:  []  Cardiac MRI (date: ), see above  [x]  Cardiac CT (date: 12/12/2007), see above  [x]  Deferred. Reason: Pacemaker  []  Ordered today, see above     PDE-5 Inhibitor recommended:  [x]  On sildenafil []  On tadalafil  []  Not tolerated. Previously on []  sildenafil (Year: )  []  tadalafil (Year: )   Side effect:  []  Deferred. Reason:   []  Ordered today, see above    Echo  [x]  Within last year (date: 05/25/2022)   []  Deferred. Reason:   []  Ordered today    Cardiopulmonary exercise test  [x]  Within last 3 years (date: 05/13/18), see above results  []  Deferred. Reason:   []   Ordered today    Advanced care planning  [x]  Date: Advanced directive in CC from 12/27/2010  []  Deferred. Reason:       Recommendations:   1. Echocardiogram reviewed today which appears stable without changes. Abdominal ultrasound without masses.   2. She reports 3 months of abdominal distension with early satiety and on exam today mildly distended. The abdominal ultrasound from today 05/25/22 did not show ascites or masses. We have some labs from 05/02/2022 which are unremarkable. We will increase spironolactone from 100 mg daily to 150 mg and continue lasix 80 mg daily. She will obtain labs in 1 week and we will call her with results.   3. Full Fontan labs in 1 week.   4. We will defer to her primary care provider regarding possible change in her antacid medication, no preference for agent between an PPI and H2 blocker.   5. Given her new abdominal distension and titration of diuretics, she will need to be seen a close interval visit in 2 months with Dr. Tobie Poet.    Discussed and seen with [facility] Attending, Dr. Dyanne Iha     Juleen China MD PGY-7   [facility]   Adult Congenital Heart Disease Fellow   Pager [phone]        **Dr Whitney Post  Director, Adult Congenital Heart Program  [facility]  [address] [place], [address]  [place], [place] [address]  Fax [phone]

## 2022-05-25 NOTE — Patient Instructions
Continue to visit the dentist every 6 months, we recommend antibiotics 30 minutes to 1 hour prior to all dental cleaning and procedures.   Please contact our office with any questions, concerns or changes to your health via mychart or by telephone [phone].  Recommend 30-40 minutes of exercise at least 4 times per week.   Your next appointment will be

## 2022-06-01 ENCOUNTER — Telehealth: Payer: BLUE CROSS/BLUE SHIELD

## 2022-06-01 DIAGNOSIS — K769 Liver disease, unspecified: Secondary | ICD-10-CM

## 2022-06-01 DIAGNOSIS — T8189XA Other complications of procedures, not elsewhere classified, initial encounter: Secondary | ICD-10-CM

## 2022-06-01 NOTE — Telephone Encounter
Spoke with patient and reviewed labs which all look stable. Her abd discomfort does not seem to be from any cardiac or liver cause. We have advised that she follow up with primary care provider for further workup. She is in agreement with this plan and all questions answered.

## 2022-06-04 NOTE — Addendum Note
Addended by: Tacey Ruiz on: 06/04/2022 09:22 AM     Modules accepted: Orders

## 2022-06-04 NOTE — Telephone Encounter
Thank you for placing referral to gastro. Pt has been scheduled and confirmed with her on 9/13 appt with Dr. Emerson Monte.

## 2022-06-05 ENCOUNTER — Telehealth: Payer: BLUE CROSS/BLUE SHIELD

## 2022-06-05 NOTE — Telephone Encounter
Call Back Request      Reason for call back:   Patient is requesting to speak with Katrina.  Liver specialist reached out- Dr.Venick, and she has an appointment on Wed. Sept.13, and Katrina was going to see if ''Dr.A'' can do an appointment that same day or prior and she would like to know if she can meet with ''Dr.A'' that same day because she lives far?    Any Symptoms:  []  Yes  [x]  No       If yes, what symptoms are you experiencing:    o Duration of symptoms (how long):    o Have you taken medication for symptoms (OTC or Rx):      If call was taken outside of clinic hours:    [] Patient or caller has been notified that this message was sent outside of normal clinic hours.     [] Patient or caller has been warm transferred to the physician's answering service. If applicable, patient or caller informed to please call back if symptoms progress.  Patient or caller has been notified of the turnaround time of 1-2 business day(s).

## 2022-06-06 NOTE — Telephone Encounter
Scheduled    LM

## 2022-06-06 NOTE — Telephone Encounter
I spoke to patient -- no openings for 9/13-- pt wanted to know if Video OK for a return visit for the end of the month. Pt is only here 13 and 14 of Sept.

## 2022-06-22 ENCOUNTER — Ambulatory Visit: Payer: BLUE CROSS/BLUE SHIELD | Attending: Cardiovascular Disease

## 2022-07-02 ENCOUNTER — Telehealth: Payer: BLUE CROSS/BLUE SHIELD

## 2022-07-02 NOTE — Telephone Encounter
Spoke with pt--she took care of appt

## 2022-07-02 NOTE — Telephone Encounter
Call Back Request      Reason for call back: Patient is requesting a call back from [facility]. Patient is needing to reschedule appt with Laurette Schimke Doctor and Katrina has been helping her with that. Please assist.     Any Symptoms:  []  Yes  [x]  No       If yes, what symptoms are you experiencing:    o Duration of symptoms (how long):    o Have you taken medication for symptoms (OTC or Rx):      If call was taken outside of clinic hours:    [] Patient or caller has been notified that this message was sent outside of normal clinic hours.     [] Patient or caller has been warm transferred to the physician's answering service. If applicable, patient or caller informed to please call back if symptoms progress.  Patient or caller has been notified of the turnaround time of 1-2 business day(s).

## 2022-07-18 ENCOUNTER — Ambulatory Visit: Payer: BLUE CROSS/BLUE SHIELD | Attending: Pediatric Gastroenterology

## 2022-08-03 ENCOUNTER — Telehealth: Payer: BLUE CROSS/BLUE SHIELD | Attending: Cardiovascular Disease

## 2022-08-03 DIAGNOSIS — Q208 Other congenital malformations of cardiac chambers and connections: Secondary | ICD-10-CM

## 2022-08-03 DIAGNOSIS — Q204 Double inlet ventricle: Secondary | ICD-10-CM

## 2022-08-03 NOTE — Progress Notes
Patient Consent to Telehealth   The patient agreed to participate in the video visit prior to joining the visit.        [facility]     Date of Visit: 08/03/2022   Reason for Visit: Follow up s/p cardiac catheterization on 08/08/17, in setting of DORV s/p Fontan conversion to extracardiac baffle on 11/21/07, and subsequent placement of epicardial permanent pacemaker.     HPI: Ann Duran is a 46 y.o. woman with the following cardiac history:  1. 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  2. Underwent modified Fontan procedure (patch connection of the IVC/SVC via the right atrium to MPA) at age 58 yrs at the [facility].  3. Postoperative chylothorax [phone], subsequently requiring exploration by a left thoracotomy, with ligation of lymphatic vessel and thoracic duct.  4. Suffered fatigue and exercise intolerance in November 2008, and was found to be in atrial fibrillation.  5. Underwent cardioversion at [facility] by Dr. Charlesetta Garibaldi on 10/14/07, along with cardiac cath which revealed excellent RA/Fontan pressures with a mean of 12-3mmHg.  6. Underwent Fontan conversion on 11/21/07, involving takedown of previous Fontan and placement of an extracardiac Fontan and bidirectional Glenn shunt and placement of epicardial permanent pacing leads. Discharged on 11/29/07  7. Re-admitted on 12/03/07 for thoracentesis of large right pleural effusion, in the setting of bradycardia and junctional rhythm  8. Underwent surgical placement of abdominal pacemaker generator on 12/05/07, but atrial lead was non-capturing, left with single site ventricular pacing.  9. Discharged on 12/12/07, after aggressive diuresis in the setting of significant ascites and perineal edema.  10. Subsequent diuresis over the next 6 weeks with resolution of ascites on moderate dose lasix and moderate dose aldactone.  11. Underwent left hemidiaphragm plication by Dr. Larwance Sachs on 03/02/2009, unsuccessful attempt at placement of an atrial lead.  12. PPM rate set at VVI 90 bpm during the hospitalization, reduced to 60 bpm by Dr. Carollee Herter on 03/29/2009.  13. In the setting of poor chronotropic response to exercise, rate response activated by Dr. Carollee Herter in July 2011 with significantly improved exercise ability.  14. Underwent placement of Mirena IUD and D & C by Dr. Ardyth Man Parvatenini in February 2012. No cardiovascular complications.  Mirena IUD replaced with LEEP procedure in January 2018  15. Good functional capacity with MVO2 of 24.8 (88% predicted) in August 2016.  Low normal single RV function, improves with exercise.  No exercise induced arrhythmias  16. V-paced 99% of time at rate of 70, generator nearing ERI as of May 2018 remote check, requiring monthly checks to detect ERI.   17. Liver US in Sept 2017 showed no lesions but METAVIR score was 4 consistent with cirrhosis.  18. Underwent cardiac cath on 08/08/17 by Dr. Tobie Poet, showing mean Fontan pressures of , and liver biopsy showing extensive bridging fibrosis, but no cirrhosis.  Spirinolactone dose was doubled from 25mg  daily to 50mg  daily in an attempt to reduce Fontan volume pressure.  19. Underwent pacemaker generator replacement ([employer]tr[employer]) on 12/12/17 by Dr. Larwance Sachs  20. Moved to [place] for work in early 2020, had issues with the humidity, insomnia and had some exertional decline.  21.  06/01/2021  cardiac catheterization & Liver Biopsy  by Dr. 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  Interval History: Ann Duran presents today for a follow up.     She will be seeing Dr Emerson Monte in hepatology on 08/29/22    She has been taking a supplement powder called athletic greens (AG1) which is a probiotic, she also takes daily dandelion detox tea, she is also eating Spanish black radish.  She is eating more organic foods.  All of these measures are helping her feel less distended.    Exercise: Walking, yoga    Dental: Up to date with dental. Does take SBE prophylaxis.       Past Medical History: As above.  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 Darlyn Chamber here at [facility].  LEEP in Jan 2018.  Mirena IUD replaced in Jan 2018.  Nose bleeds s/p bipolar cauterization 10/2016    Allergies: No Known Allergies     Medications:   Medications that the patient states to be currently taking   Medication Sig   ? Ascorbic Acid (VITAMIN C) 1000 MG tablet Take 1 tablet (1,000 mg total) by mouth as needed for.   ? aspirin 81 mg EC tablet Take 1 tablet (81 mg total) by mouth daily.   ? clonazePAM 0.5 mg tablet TAKE 1 TABLET(0.5 MG) BY MOUTH AT NIGHT AS NEEDED FOR INSOMNIA   ? cyanocobalamin 1000 mcg tablet Take 1 tablet (1,000 mcg total) by mouth as needed for.   ? fluticasone 50 mcg/act nasal spray    ? furosemide 80 mg tablet Take 1 tablet (80 mg total) by mouth daily.   ? lisinopril 2.5 mg tablet Take 1 tablet (2.5 mg total) by mouth daily.   ? pantoprazole 20 mg DR tablet TAKE 1 TABLET BY MOUTH  DAILY AS NEEDED.   ? potassium chloride 10 mEq CR tablet Take 1 tablet (10 mEq total) by mouth daily.   ? sildenafil 20 mg tablet Take 1 tablet (20 mg total) by mouth three (3) times daily.   ? spironolactone 50 mg tablet Take 3 tablets (150 mg total) by mouth daily.   ? zolpidem 5 mg tablet Take 0.5 tablets (2.5 mg total) by mouth at bedtime as needed for Sleep.       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  Nonsmoker, occas ETOH.    Family History: Mother and father in their 28s in good health. Siblings, she has1 brother who is in good health. There is no history of congenital heart disease in the family.    ROS: Negative and noncontributory except as noted above in HPI and Interval Events.     Physical Exam:  VS: There were no vitals taken for this visit.   General: Pleasant woman, in NAD.    At last in person visit-  Skin: No rashes. Sternotomy and left thoracotomy scars well healed.  Head and Neck: Pupils are equal and reacting.   Mouth: Normal oral mucosa. Excellent dentition  Neck:Jugular venous pressure ~12cm  Chest:  Lungs clear bilaterally   Heart: Single S1 with no systolic murmurs or rubs. No diastolic murmurs or gallops.  Abdomen: Mildly distended, no fluid wave, nontender, non tympanitic, mild distended veins noted.   Extremities: No edema, mild varicosity on inner thighs and lower right leg.   Neurologic: Alert and oriented x 4.  Psychologic: Normal mood and affect.    Cardiac Diagnostic Data:    Abdominal Ultrasound 05/25/2022  CLINICAL HISTORY: s/p Fontan, please assess for  nodules, lesions and absesses. ? COMPARISON: Abdominal ultrasound dated 05/23/2018. ? TECHNIQUE: Real time grayscale and color Doppler images of the abdominal organs were obtained using a curved transducer. ? FINDINGS: ? Pancreas: Partially visualized and unremarkable. ? Liver: Coarse in echotexture with irregular surface contour. ? Focal liver lesions: None. ? Portal vein: Normal, hepatopetal flow. ? Gallbladder: Normally distended, containing gallstones. No gallbladder wall thickening. No sonographic Murphy's sign. ? Bile ducts: Normal in caliber. ? Kidneys: Normal in size with normal cortical thickness and echogenicity. No hydronephrosis. ? Spleen: Splenomegaly. ? Ascites: None in the upper abdomen. ? Visualized proximal aorta and IVC: Unremarkable. ? MEASUREMENTS: Liver: 14.6 cm Common Duct: 4.8 mm Right Kidney: 11.5 cm Left Kidney: 12.1 cm Spleen: 17.8 cm Aorta: 1.6 cm ?    IMPRESSION: ?  1. Coarsening of hepatic echotexture with irregularity in surface contour suggesting fibrosis/cirrhosis. No suspicious liver lesions. ?  2. Cholelithiasis without evidence of cholecystitis. ?  3. Splenomegaly suggesting portal hypertension.     Echocardiogram 05/25/2022  PHYSICIAN INTERPRETATION: CONOTRUNCAL ANATOMY: The cardiac structural malformations are consistent with a diagnosis of levo-transposition of the great arteries. LEFT VENTRICLE: Patient demonstrated normal sinus rhythm during echocardiogram. 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 RIGHT ATRIUM: Large secundum ASD. Status post extracardiac Fontan. Visualized portions of the Fontan appear patent, no obvious fenestration noted. Sherrine Maples shunt appears widely patent with low velocity flow noted. MITRAL VALVE: Trace mitral valve regurgitation. TRICUSPID VALVE: Trace tricuspid valve regurgitation. AORTIC VALVE: No evidence of aortic valve regurgitation. AORTA: No coarctation of the aorta. PERICARDIUM: There is no evidence of pericardial effusion. CONCLUSIONS  1. 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  2. 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 Low normal systemic RV systolic function.  3. Large secundum ASD. Status post extracardiac Fontan. Visualized portions of the Fontan appear patent, no obvious fenestration noted. Sherrine Maples shunt appears normal.  4. L-transposition of the great arteries.  5. A prior echo performed on 08/09/2020 was reviewed for comparison. No significant changes noted since the previous study.    06/01/2021 Cardiac Catheterization [facility]- Dr. Bradd Canary                 Liver Biopsy 06/01/2021 [facility]- Dr. Bradd Canary       Echocardiogram 08/09/20- reviewed by me:  1. 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   2. Double out right ventricle with hypoplastic LV and large VSD with'' functional single ventricle'' Right ventricular hypertrophy. Low normal systemic RV systolic function.   3. Large secundum ASD. Status post extracardiac Fontan. Visualized portions of the Fontan appear patent, no obvious fenestration noted. Sherrine Maples shunt appears normal.   4. L-transposition of the great arteries.   5. Large secundum ASD. Status post extracardiac Fontan. Visualized portions of the Fontan appear patent, no obvious fenestration noted. Sherrine Maples shunt appears widely patent with low velocity flow noted.   6. A prior echo performed on 07/29/2019 was reviewed for comparison. No significant changes noted since the previous study.    Pacemaker remote transmission 05/07/20:  4.9 years longevity, VVIR mode, device function normal, one episode of possible NSVT (3 beats) on 6/12 after walking 5K, asymptomatic, 99% V pacing.    Echocardiogram 07/29/2019:  2D AND M-MODE MEASUREMENTS (normal ranges within parentheses): Aorta/Left Atrium: Aortic Root, d (2D): 3.0 cm Aortic Valve: AoV Max Vel: 0.84 m/s AoV Peak PG: 3 mmHg AoV Mean PG:  Tricuspid Valve and PA/RV Systolic Pressure: RA Pressure: 8 mmHg Aorta: Ao Asc: 2.4 cm Ao Arch: 2.2 cm Ao Desc: 1.7 cm   PHYSICIAN INTERPRETATION: CONOTRUNCAL ANATOMY: The cardiac structural malformations are consistent with a diagnosis of levo-transposition of the great arteries. LEFT VENTRICLE: Patient demonstrated normal sinus rhythm during echocardiogram. 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 RIGHT VENTRICLE: Double out right ventricle with hypoplastic LV and large VSD with '' functional single ventricle'' Right ventricular hypertrophy. Low normal systemic RV systolic function. RIGHT ATRIUM: Right atrial pressure is estimated at 8 mmHg. Right atrial pressure is estimated at 8 mmHg. Large secundum ASD. Status post extracardiac Fontan. Visualized portions of the Fontan appear patent, no obvious fenestration noted. Sherrine Maples shunt appears normal. MITRAL VALVE: No evidence of mitral valve regurgitation. TRICUSPID VALVE: Trace tricuspid valve regurgitation. AORTIC VALVE: The aortic valve was not well visualized. Aorta is anterior of the pulmonic valve. No evidence of aortic valve regurgitation. PULMONIC VALVE: The pulmonic valve was not well visualized. AORTA: No coarctation of the aorta. PULMONARY ARTERY: The pulmonary artery is not well seen. SYSTEMIC VEINS: The inferior vena cava is normal in size and exhibits less than 50% respiratory change. PERICARDIUM: There is no evidence of pericardial effusion. CONCLUSIONS :  1. L-transposition of the great arteries.  2. 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 Low normal systemic RV systolic function.  3. Double outlet right ventricle-doubly commited ventricular septal defect-aorta anterior to pulmonary artery-no left ventricular outflow tract obstruction.  4. 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  5. Large secundum ASD. Status post extracardiac Fontan. Visualized portions of the Fontan appear patent, no obvious fenestration noted. Sherrine Maples shunt appears normal.  6. A prior echo performed on 05/13/2018 was reviewed for comparison. No significant changes noted since the previous study.    CAM  Monitor 04/12/17:  Predominant rhythm: Paced  ? Paced rhythm  ? Ventricular Ectopy = (<1%)  1802 isolated, unifocal PVCs and 208 pairs  ? Atrial Ectopy = (<1%)  1 isolated PAC  Predominantly V paced rhythm with rare supraventricular beats  (most are labeled PVC's). Symptoms of ''Chest discomfort'  correlated with 2 narrow complex beats/. Symptoms of  Dizziness/Lightheadedness correlated with ventricular pacing.  One of four button presses correlated with a narrow complex  beat, the remaining button presses correlated with ventricular  pacing.    Echocardiogram:  05/13/18  PHYSICIAN INTERPRETATION:  CONOTRUNCAL ANATOMY: The cardiac structural malformations are consistent with a diagnosis of levo-transposition of the great arteries.  LEFT VENTRICLE: Patient demonstrated normal sinus rhythm during echocardiogram. 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  RIGHT VENTRICLE: Double out right ventricle with hypoplastic LV and large VSD with '' functional single ventricle'' Right ventricular hypertrophy. Low normal systemic RV systolic function.  LEFT ATRIUM:  RIGHT ATRIUM: Right atrial pressure is estimated at 8 mmHg. Right atrial pressure is estimated at 8 mmHg. Large secundum ASD. Status post extracardiac Fontan. Visualized portions of the Fontan appear patent, no obvious fenestration noted. Sherrine Maples shunt   appears normal.  MITRAL VALVE: No evidence of mitral valve regurgitation.  TRICUSPID VALVE: Trace tricuspid valve regurgitation.  AORTIC VALVE: The aortic valve was not well visualized. Aortic valve area was not quantified by continuity equation on this study. Aorta is anterior of the pulmonic valve.  No evidence of aortic regurgitation.  PULMONIC VALVE: No indication of pulmonic valve regurgitation. Pulmonic is posterior of the aorta.  AORTA: Normal aortic arch,  normal descending aorta, normal mid ascending aorta and the aortic root is normal in size and structure. No coarctation of the aorta.  PULMONARY ARTERY: The pulmonary artery is not well seen.  SYSTEMIC VEINS: The inferior vena cava is normal in size and exhibits less than 50% respiratory change.  PERICARDIUM: There is no evidence of pericardial effusion.  CONCLUSIONS:   1. L-transposition of the great arteries.   2. 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 Low normal systemic RV systolic function.   3. Double outlet right ventricle-doubly commited ventricular septal defect-aorta anterior to pulmonary artery-no left ventricular outflow tract obstruction.   4. 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   5. Large secundum ASD. Status post extracardiac Fontan. Visualized portions of the Fontan appear patent, no obvious fenestration noted. Sherrine Maples shunt appears normal.   6. A prior echo performed on 05/07/2017 was reviewed for comparison. No significant changes noted since the previous study.    Stress Echo/CPX:  05/13/18  BASELINE ECG:   Ventricular paced with biphasic T wave in lead V2  PROTOCOL: Bruce. (Treadmill advanced to stage 3 on its own during stage 2 of exercise after 1')  CONTROL:   HR:  72    BP: 110/64     O2 Sat:  98% (forehead) 92% (finger)   START:  1.7 MPH at 10% grade.   STOP: 11.4 METS with 3.4 mph at 14% grade x 2?37 after 7?37'' total exercise time due to shortness of breath.  Peak HR: 129      Peak BP: 128/66         Peak O2 Sat:  94%   Peak Double-Product: 16,512    Maximum VO2 = 1391 ml which is 77% of predicted, maximum VO2/kg = 23.7 ml/kg which is 85% of predicted (Note: The reference values are adjusted for weight and modality of exercise)  VE/VO2 =  40   VE/VCO2 = 36      VE/VCO2 slope = 30.1  VO2/HR =11.1 which is 111% predicted  Breathing Reserve = 41  RQ= 1.12  Anaerobic Threshold was reached at HR = 103 VO2 =  1144 ml     VO2/kg =  19.5 ml/kg  RESULTS:   Symptoms:  Shortness of breath and leg fatigue, denied chest pain  ST-T Changes: Uninterruptable in the presence of ventricular pacing.  Dysrhythmias:  Rare PVCs during exercise  BP Response:  Blunted.  IMPRESSIONS: Fair exercise tolerance achieving 71% maximum predicted heart rate, limited by shortness of breath. ST changes during exercise are uninterpretable in the presence of ventricular pacing. No chest pain. Rare PVCs during exercise. Blunted BP at a low double product. Maximum VO2/kg = 23.7 ml/kg which is 85% of predicted. VE/VCO2 slope = 30.1 which is 119% of predicted. Compared with previous stress/CPX of 06/21/15 exercise tolerance has decreased from 12.7 METS, MVO2/kg had been 24.8, VE/VCO2 slope had been 23.1.  BASELINE:  Baseline: Single ventricle with low normal systolic function.  ADDITIONAL BASELINE FINDINGS:  LEFT VENTRICLE: Global left ventricular systolic function is mildly decreased (LVEF 40-49%).  MITRAL VALVE: Trace mitral valve regurgitation.  TRICUSPID VALVE: Trace tricuspid valve regurgitation.     Global left ventricular function increased appropriately with stress. No new segmental wall motion abnormalities were seen. Global left ventricular systolic function at peak stress is lower limits of normal (LVEF 50-55%). Mild mitral valve regurgitation.   Mild tricuspid regurgitation. Post-exercise: mild increase in single ventricle  contractility.     SUMMARY:   1. Please see separately resulted cardiopulmonary exercise test for details of exercise performance.   2. DORV, L-TGA, hypoplastic LV, large VSD, large ASD.   3. Baseline: Single ventricle with low normal systolic function.   4. Post-exercise: mild increase in single ventricle contractility    Liver US:  05/13/18  IMPRESSION:  1. Irregular liver contour and coarsened in echotexture representing underlying chronic liver disease/fibrosis. No focal mass. Cholelithiasis. Splenomegaly supports portal hypertension. Normal liver Doppler.  2. Shear wave liver stiffness measurement indicating moderate to severe fibrosis (MetaVir stages F2-F3). Note, the morphologic appearance of the liver appears more fibrotic than Elastography measurements indicate.    Cardiac Cath:  08/08/17:      Liver US  07/01/17 (outside report) shows no lesions, Metavir score of 4.    Echocardiogram:  05/07/17 PHYSICIAN INTERPRETATION:  CONOTRUNCAL ANATOMY: The cardiac structural malformations are consistent with a diagnosis of levo-transposition of the great arteries.  LEFT VENTRICLE: Visually estimated left ventricular ejection fraction 55-60%. Patient demonstrated normal sinus rhythm during echocardiogram. 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  RIGHT VENTRICLE: Double out right ventricle with hypoplastic LV and large VSD with '' functional single ventricle'' Right ventricular hypertrophy. Low normal systemic RV systolic function.  RIGHT ATRIUM: Right atrial pressure is estimated at 8 mmHg. Right atrial pressure is estimated at 8 mmHg. Large secundum ASD. Status post extracardiac Fontan. Visualized portions of the Fontan appear patent, no obvious fenestration noted. Sherrine Maples shunt   appears normal.  MITRAL VALVE: No evidence of mitral valve regurgitation.  TRICUSPID VALVE: Mild tricuspid valve regurgitation.  AORTIC VALVE: The aortic valve was not well visualized. Aorta is anterior of the pulmonic valve.  No evidence of aortic regurgitation.  PULMONIC VALVE: No indication of pulmonic valve regurgitation. Pulmonic is posterior of the aorta.  AORTA: Normal aortic arch and normal descending aorta. The ascending aorta was not well visualized. Mildly enlarged aortic root (4.2 cm). No coarctation of the aorta.  PULMONARY ARTERY: The pulmonary artery is not well seen.  SYSTEMIC VEINS: The inferior vena cava is normal in size and exhibits less than 50% respiratory change.  PERICARDIUM: There is no evidence of pericardial effusion.  CONCLUSIONS:   1. L-transposition of the great arteries.   2. Double outlet right ventricle-doubly commited ventricular septal defect-aorta anterior to pulmonary artery-no left ventricular outflow tract obstruction.   3. 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 Low normal systemic RV systolic function.   4. Large secundum ASD. Status post extracardiac Fontan. Visualized portions of the Fontan appear patent, no obvious fenestration noted. Sherrine Maples shunt appears normal.   5. Left ventricular ejection fraction is approximately 55-60%.   6. Mildly enlarged aortic root (4.2 cm).   7. Mild tricuspid valve regurgitation.   8. A prior echo performed on 03/09/2016 was reviewed for comparison. No significant changes noted since the previous study.   9. 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     ECG 11/07/16: Ventricular paced. Ventricular rate of 75 bpn, QRS duration 184 ms. QT/QTc 498/556 ms    Liver Ultrasound 07/27/16 per report from OSH: Liver 17.5 cm in length. It is normal in echodensity. Liver contour is smooth. No focal lesion os identified. Hepatopedal flow is identifies in the main, right, and left portal veins. Gallbladder and biliary tree: There are several small mobile gallstones. There is no focal tenderness. The gallbladder wall is thickened measuring  0.7 cm. There is no pericholecystic fluid. No intra or extrahepatic biliary ductal dilatation.      Echocardiogram 03/09/16: CONOTRUNCAL ANATOMY: The cardiac structural malformations are consistent with a diagnosis of levo-transposition of the great arteries. The observed structural malformations are consistent with the diagnosis of double outlet right ventricle with doubly?commited ventricular septal defect. The aorta is anterior to the pulmonary artery. There is no sub-aortic obstruction of left ventricular outflow tract. LEFT VENTRICLE: Normal left ventricular size. Visually estimated left ventricular ejection fraction 55-60%. Small left ventricular size. RIGHT VENTRICLE: (DTI 6.0 cm/s). 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 Low normal systemic RV systolic function. LEFT ATRIUM: Mild left atrial enlargement. RIGHT ATRIUM: Right atrial pressure is estimated at 8 mmHg. Large secundum ASD. Status post extracardiac Fontan. Visualized portions of the Fontan appear patent, no obvious fenestration noted. Sherrine Maples shunt appears normal. MITRAL VALVE: No evidence of mitral valve regurgitation. TRICUSPID VALVE: Mild tricuspid valve regurgitation. AORTIC VALVE: The aortic valve is trileaflet and structurally normal, with normal leaflet excursion. Aorta is anterior of the pulmonic valve. No evidence of aortic regurgitation. PULMONIC VALVE: No indication of pulmonic valve regurgitation. Pulmonic is posterior of the aorta. AORTA: Normal mid ascending aorta and normal aortic arch. Mildly enlarged aortic root (3.9 cm). SYSTEMIC VEINS: The inferior vena cava is normal in size and exhibits less than 50% respiratory change. PERICARDIUM: There is no evidence of pericardial effusion. ?? CONCLUSIONS: ?1. 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 Low normal systemic RV systolic function. ?2. Left ventricular ejection fraction is approximately 55-60%. ?3. Double outlet right ventricle. ?4. Double outlet right ventricle ??? -doubly commited ventricular septal defect ??? -aorta anterior to pulmonary artery ??? -no left ventricular outflow tract obstruction. ?5. L-transposition of the great arteries. ?6. Mild left atrial enlargement. ?7. Large secundum ASD. Status post extracardiac Fontan. Visualized portions of the Fontan appear patent, no obvious fenestration noted. Sherrine Maples shunt appears normal. ?8. Mild tricuspid valve regurgitation. ?9. Mildly enlarged aortic root (3.9 cm).    Abdominal US 06/21/15:  The pancreas is partially visualized and grossly unremarkable. The liver is mildly heterogeneous in echogenicity. There is no focal liver lesion. Portal vein demonstrates hepatopetal flow. No intra- or extrahepatic biliary dilation. The common bile duct is normal in caliber. The gallbladder is normally distended, containing gallstones. No gallbladder wall thickening or pericholecystic fluid. No sonographic Murphy's sign. The spleen is mildly enlarged. The kidneys are normal in size. Both kidneys demonstrate normal cortical thickness and echogenicity. No hydronephrosis. There is no ascites. The visualized proximal aorta and IVC are unremarkable. MEASUREMENTS: Liver: 15.1 cm Common Duct: 3 mm Right Kidney: 10.5 cm Left Kidney: 11.5 cm Spleen: 17 cm Aorta: 1.1 cm SHEAR WAVE LIVER STIFFNESS MEASUREMENTS: Mean 1.63 +/-0.12 m/sec, Median 1.66 m/sec, equating to 5.7-12 kPA. REFERENCE (m/s, kPa): Normal: 0.81-1.22 m/s 2.0-4.5 kPa (METAVIR F0) Normal/mild: 1.22-1.37 m/s 4.5-5.7 kPa (METAVIR F0-F1) Mild/moderate: 1.37-2 m/s 5.7-12.0 kPa (METAVIR F2-F3) Moderate/severe: 2-2.64 m/s 12.0-21.0 kPa (METAVIR F3-F4) Severe: >2.64 m/s >21.0 kPa (METAVIR F4) IMPRESSION: 1. Heterogenous liver echogenicity, suggesting diffuse liver disease. No suspicious liver lesions. Patent hepatic vasculature. 2. Shear wave liver stiffness measurement indicating mild/moderate liver fibrosis, MetaVir score of F2-3. 3. Splenomegaly. 4. Cholelithiasis without evidence of acute cholecystitis.    Echocardiogram 06/21/15:  2D AND M-MODE MEASUREMENTS (normal ranges within parentheses): Aorta/Left Atrium: Aortic Root, d (2D): 2.8 cm LV DIASTOLIC  FUNCTION: MV Peak E: 0.63 m/s E/e' Ratio: 40.2 LV IVRT: 134 msec Decel Time: 229 msec Right Ventricle: TAPSE: 1.0 cm Aortic Valve: AoV Max Vel: 1.05 m/s AoV Peak PG: 4 mmHg AoV Mean PG: Tricuspid Valve and PA/RV Systolic Pressure: TR Max Velocity: 3.2 m/s RA Pressure: 8 mmHg RVSP/PASP: 49 mmHg Pulmonic Valve: PV Max Velocity: 0.8 m/s PV Max PG: 2 mmHg PV Mean PG: Aorta: Ao Asc: 2.4 cm. PHYSICIAN INTERPRETATION: CONOTRUNCAL ANATOMY: The cardiac structural malformations are consistent with a diagnosis of levo-transposition of the great arteries. 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 The aorta is anterior to the pulmonary artery. There is no sub-aortic obstruction of left ventricular outflow tract. LEFT VENTRICLE: Visually estimated left ventricular ejection fraction 55-60%. Small left ventricular size. RIGHT VENTRICLE: TAPSE 1.0 cm, (DTI 6.0 cm/s). 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 Low normal systemic RV systolic function. RIGHT ATRIUM: Right atrial pressure is estimated at 8 mmHg. Large secundum ASD. Status post extracardiac Fontan. Visualized portions of the Fontan appear patent, no obvious fenestration noted. Unable to obtain images of Glenn shunt. MITRAL VALVE: No evidence of mitral valve regurgitation. TRICUSPID VALVE: Mild tricuspid valve regurgitation. Tricuspid regurgitation velocity is not well seen. AORTIC VALVE: The aortic valve is trileaflet and structurally normal, with normal leaflet excursion. Aorta is anterior of the pulmonic valve. No evidence of aortic regurgitation. PULMONIC VALVE: No indication of pulmonic valve regurgitation. Pulmonic is posterior of the aorta. AORTA: The aortic root is normal in size and structure, normal mid ascending aorta and normal aortic arch. SYSTEMIC VEINS: The inferior vena cava is not well visualized. PERICARDIUM: There is no evidence of pericardial effusion. CONCLUSIONS: 1. Small left ventricular size 2. 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 3. L-transposition of the great arteries. 4. Mild tricuspid valve regurgitation. 5. 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 Low normal systemic RV systolic function.     CPEX/Echo 06/11/15:  BASELINE: Low normal systemic RV systolic function at rest. Hypoplastic LV. ADDITIONAL BASELINE FINDINGS: MITRAL VALVE: Trace mitral valve regurgitation. TRICUSPID VALVE: Trace tricuspid valve regurgitation. No new segmental wall motion abnormalities were seen. Global left ventricular systolic function at peak stress is normal (LVEF 60-65%). Improved systemic RV systolic function with stress. No increase in degree of atrioventricular valve regurgitation with exercise. SUMMARY: 1. Improved systemic RV systolic function with stress. No increase in degree of atrioventricular valve regurgitation with exercise. 2. Low normal systemic RV systolic function at rest. Hypoplastic LV. BASELINE ECG: Ventricular paced rhythm PROTOCOL: Bruce. CONTROL: HR: 74 BP: 120/74 O2 Sat: 95 % START: 1.7 MPH at 10% grade. STOP: 12.7 METS with 4.2 mph at 16 % grade x 1?33'' after 10?33'' total exercise time due to shortness of breath. Peak HR: 141 Peak BP: 136/56 Peak O2 Sat: 95 % Peak Double-Product: 19,176 Maximum VO2 = 1386 ml which is 77 % of predicted, maximum VO2/kg = 24.8 ml/kg which is 88 % of predicted (Note: The reference values are adjusted for weight and modality of exercise) VE/VO2 = 35 VE/VCO2 = 31 VE/VCO2 slope = 23.1 VO2/HR = 9.8 which is 99 % predicted Breathing Reserve = 50 RQ= 1.13 Anaerobic Threshold was reached at HR = 103 VO2 = 1094 ml  VO2/kg = 19.6 ml/kg RESULTS: Symptoms: Shortness of breath and leg fatigue; no chest pain or discomfort. ST-T Changes: Uninterpretable due to paced rhythm. Dysrhythmias: Rare PVC during exercise BP Response: Appropriate. IMPRESSIONS: Good exercise tolerance achieving 77 % maximum predicted heart rate, limited by shortness of breath. Exercise induced ST changes are uninterpretable due to paced rhythm. No exercise induced chest pain at a low double product. Rare PVCs as described above. Maximum VO2 = 1386 ml which is 77 % of predicted, maximum VO2/kg = 24.8 ml/kg which is 88 % of predicted . VE/VCO2 slope = 23.1 which is 92 % of predicted. Compared with previous stress/CPX of 04/24/13 maximum VO2/kg had been 25.0 at 11.9 METS    Echocardiogram:  04/21/13: CONCLUSIONS:  1. Double outlet right ventricle.  2. Moderately enlarged right ventricular size and low normal systolic function. 3. Moderately increased RV wall thickness. 4. Mild tricuspid regurgitation. 5. Status post extracardiac Fontan, appears widely patent, no fenestration noted. Sherrine Maples shunt not well visualized.    Labs:      05/31/22:  WBC 3.3k, Hgb 13.2, plts 107k, INR 1.1, K 4.4, creat 0.86, TB 1, TP 8.2, AFP 3.6, Starr 19-9 14, prealbumin 27  05/02/2022 WBC 2.9 Hgb 13.1 Plts 90 Na 135  K 4.3 BUN 19 Cr 0.84 Alb 4.9 AST 24 ALT 19 ALP 74      Component 05/02/22 08/25/20 08/17/20 08/18/18   Auto WBC 2.9?Low? 3.0?Low? 2.8?Low? 5.80   RBC 4.46 4.98 4.93 5.11   Hemoglobin 13.1 14.6 14.1 15.0   Hematocrit 38.7 43.5 43.0 44.5   MCV 87 87 87 87.1   MCH 29.4 29.3 28.6 29.4   MCHC 33.9 33.6 32.8 33.7   RDW 12.3 12.1 12.1 13.8   Platelets 90?Low Panic?90 101?Low?  90?Low Panic?  89?Low?   Hematology Comments: Note:  Note:  Note:  --     08/18/18: WBC 5.8 RBC 5.11 Hgb 15 Hct 44.5 Plt 89 NA 139 K 5.3 BUN 16 Creat 0.81 Gluc 71 BNP 149 TSH 2.39  08/08/17: Creat 0.72, BUN 12, K 3.8  07/01/17:  Chol 121, trig 58, HDL 40, LDL 69, Hgb A1c 5.4, INR 1.2, prealbumin 24, TSH 3.6, Hct 43.2, Plat 82k  07/26/16: WBC 3.0, HGB 4.69, HCT 40.7, PLT 75, NA 137, K 3.8, BUN 13, CREAT 0.7, ALK PHOS 57, AST 30, ALT 38, TBil 1.3, GGT 100, PREALB 24, BNP 113, INR 1.2, TSH 4.24, FT3 3.6, FT4 1.05.  04/01/13:  Hgb 14.7, Hct 42.4, MCV 85.1, plat ct 106k, Na 143, K 4.2, creat 0.8, BUN 19, AST 36, ALT 32    Impressions:  1. DORV (although recent echos appear to show a DILV with single LVEF 50%, and earlier surgical notes describe her anatomy as DILV), L-malposed great arteries: s/p RA-PA anastomosis Fontan at age 83 years, now s/p Extracardiac Fontan and bidirectional Sherrine Maples shunt on 11/21/07 by Dr. Richardo Hanks, including bi-atrial Maze procedure and placement of a permanent atrial pacing lead.  2. Postoperative junctional rhythm and bradycardia: re-admitted 4 days after discharge with large right pericardial effusion requiring thoracentesis and placement of an abdominal pacemaker generator and epicardial ventricular lead on 12/05/07 (non-functioning atrial lead).  3. Ascites, improved on aldactone. No evidence of ascites on annual abdominal US.  4. Longstanding history of irregular and heavy menses, thus far unsuccessfully regulated on several types of hormonal therapy. Better controlled on Mirena since 2012.  Also has cervical HPV.  Now with CIN III, underwent LEEP procedure in Jan 2018  5. History of atrial fibrillation, but no post Fontan revision tachyarrhythmias thus far, formerly on coumadin (possible side effect of hair loss), now on ASA.  6. Longstanding elevated left hemidiapragm, s/p successful plication on 03/02/2009 by Dr. Larwance Sachs.  7. Liver US in 2014, 2016 and 2017, and 2018 shows no lesions, but 2017 and 2018 outside liver US report Metavir score of 4, which is consistent with cirrhosis.  2019 liver US at [facility] showed MetaVir stage F2-F3, although elastography impacted by congestive hepatopathy.   8. Cardiac cath and liver biopsy on 08/08/17, with mean Fontan pressure under sedation of , and liver biopsy showing extensive bridging fibrosis but no cirrhosis.  Spirinolactone was increased from 25 to 50mg  to 100mg  after this cath  9. Ventricular pacemaker, pacing 99% of time.   Generator replaced on 12/12/17 by Dr. Larwance Sachs. Deferred placement of transvenous atrial lead via her LPA to minimize V pacing, based on Mirta's wish not to be committed to anticoagulation  10. Stable exercise capacity in July 2019, with MVO2 23.7 (85% predicted), previously 24.8 and 88% predicted in 2016    [facility]    Labs (BMP, BNP, LFTs, GGT, AFP, INR, CBC, Prealbumin):  [x]  Within last year (date: 7/23)  []  Ordered today:   []  Deferred:      Hep C screening  [x]  Prior HCV Ab negative (date: 06/21/15)  []  Not indicated (no surgery prior to early 1990s)  []  Ordered    Last hemodynamic cath:   [x]  Within last 10 years (date: 06/01/2021), see above results   []  Deferred. Reason:   []  Scheduled    Last Liver biopsy  [x]  Within last 10 years (date: 06/01/2021), see above results   []  Scheduled with cath    Liver Imaging:  []  MRI Abdomen within last 5 years (date: ), see above  []  Triple phase CT within last 5 years (date: ), see above  [x]  Liver ultrasound completed (date: 05/25/2022), see above  []  Ordered today, see above.     Advanced cardiac imaging:  []  Cardiac MRI (date: ), see above  [x]  Cardiac CT (date: 12/12/2007), see above  [x]  Deferred. Reason: Pacemaker  []  Ordered today, see above     PDE-5 Inhibitor recommended:  [x]  On sildenafil []  On tadalafil  []  Not tolerated. Previously on []  sildenafil (Year: )  []  tadalafil (Year: )   Side effect:  []  Deferred. Reason:   []  Ordered today, see above    Echo  [x]  Within last year (date: 05/25/2022)   []  Deferred. Reason:   []  Ordered today    Cardiopulmonary exercise test  []  Within last 3 years (date: 05/13/18), see above results  []  Deferred. Reason:   []  Ordered today    Advanced care planning  [x]  Date: Advanced directive in CC from 12/27/2010  []  Deferred. Reason:       Recommendations:   1. She reports feeling much improved when it comes to abdominal distention, this may be due to her dietary changes and using probiotic supplements and/or increased dose of aldactone, be that as it may, will plan to continue current aldactone dose.   2. Agree with hepatology FU with Dr Emerson Monte.   3. Continue local FU with Dr Adriana Simas  4. RTC in 6-9 months with repeat liver US, Fontan labs, and echo at that time.

## 2022-08-08 ENCOUNTER — Inpatient Hospital Stay: Payer: BLUE CROSS/BLUE SHIELD

## 2022-08-08 DIAGNOSIS — Z950 Presence of cardiac pacemaker: Secondary | ICD-10-CM

## 2022-08-08 DIAGNOSIS — R001 Bradycardia, unspecified: Secondary | ICD-10-CM

## 2022-08-09 NOTE — Procedures
SEE MEDIA SECTION FOR DOWNLOAD PRINTOUT     Ann Duran is a 46 y.o. female  [id number]  Reading MD:  Dr Carollee Herter    A Medtronic Carelink remote monitor was received as a routine download. See Media for full report.

## 2022-08-15 ENCOUNTER — Ambulatory Visit: Payer: BLUE CROSS/BLUE SHIELD | Attending: Pediatric Gastroenterology

## 2022-08-29 ENCOUNTER — Ambulatory Visit: Payer: BLUE CROSS/BLUE SHIELD | Attending: Pediatric Gastroenterology

## 2022-08-29 DIAGNOSIS — R945 Abnormal results of liver function studies: Secondary | ICD-10-CM

## 2022-08-29 DIAGNOSIS — R932 Abnormal findings on diagnostic imaging of liver and biliary tract: Secondary | ICD-10-CM

## 2022-08-29 DIAGNOSIS — Q208 Other congenital malformations of cardiac chambers and connections: Secondary | ICD-10-CM

## 2022-08-29 NOTE — Progress Notes
OUTPATIENT HEPATOLOGY CONSULT  DATE OF SERVICE: 08/29/22  REFERRING MD: Dr. Tobie Poet  REASON FOR REFERRAL: Congenital Heart Disease, s/p Fontan, Evaluate for Fontan associated liver disease    HPI: Ann Duran is a 46 y.o. female with a h/o   1. 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  2. Underwent modified Fontan procedure (patch connection of the IVC/SVC via the right atrium to MPA) at age 29 yrs at the [facility].  3. Postoperative chylothorax [phone], subsequently requiring exploration by a left thoracotomy, with ligation of lymphatic vessel and thoracic duct.  4. Suffered fatigue and exercise intolerance in November 2008, and was found to be in atrial fibrillation.  5. Underwent cardioversion at [facility] by Dr. Charlesetta Garibaldi on 10/14/07, along with cardiac cath which revealed excellent RA/Fontan pressures with a mean of 12-60mmHg.  6. Underwent Fontan conversion on 11/21/07, involving takedown of previous Fontan and placement of an extracardiac Fontan and bidirectional Glenn shunt and placement of epicardial permanent pacing leads. Discharged on 11/29/07  7. Re-admitted on 12/03/07 for thoracentesis of large right pleural effusion, in the setting of bradycardia and junctional rhythm  8. Underwent surgical placement of abdominal pacemaker generator on 12/05/07, but atrial lead was non-capturing, left with single site ventricular pacing.  9. Discharged on 12/12/07, after aggressive diuresis in the setting of significant ascites and perineal edema.  10. Subsequent diuresis over the next 6 weeks with resolution of ascites on moderate dose lasix and moderate dose aldactone.  11. Underwent left hemidiaphragm plication by Dr. Larwance Sachs on 03/02/2009, unsuccessful attempt at placement of an atrial lead.  12. PPM rate set at VVI 90 bpm during the hospitalization, reduced to 60 bpm by Dr. Carollee Herter on 03/29/2009.  13. In the setting of poor chronotropic response to exercise, rate response activated by Dr. Carollee Herter in July 2011 with significantly improved exercise ability.  14. Underwent placement of Mirena IUD and D & C by Dr. Ardyth Man Parvatenini in February 2012. No cardiovascular complications.  Mirena IUD replaced with LEEP procedure in January 2018  15. Good functional capacity with MVO2 of 24.8 (88% predicted) in August 2016.  Low normal single RV function, improves with exercise.  No exercise induced arrhythmias  16. V-paced 99% of time at rate of 70, generator nearing ERI as of May 2018 remote check, requiring monthly checks to detect ERI.   17. Liver US in Sept 2017 showed no lesions but METAVIR score was 4 consistent with cirrhosis.  18. Underwent cardiac cath on 08/08/17 by Dr. Tobie Poet, showing mean Fontan pressures of , and liver biopsy showing extensive bridging fibrosis, but no cirrhosis.  Spirinolactone dose was doubled from 25mg  daily to 50mg  daily in an attempt to reduce Fontan volume pressure.  19. Underwent pacemaker generator replacement (Medtronic) on 12/12/17 by Dr. Larwance Sachs  20. Moved to [place] for work in early 2020, had issues with the humidity, insomnia and had some exertional decline.   06/01/2021  cardiac catheterization & Liver Biopsy  by Dr. 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       INTERVAL EVENTS:    ****  C/o bloating / abd distension --> Dr. Tobie Poet increased aldactone with some improvement    Ann Duran denies changes in exercise tolerance, but she does endorse she has not been consistently working out.  She continues to walk consistently, lift free weights, attend HIIT classes, and do yoga. Denies fatiguing easily and shortness of breath. No chest pain or palpitations.    In July 2023, she began experienced abdominal fullness and satiating quickly when eating. In July 2023, she began spirolactone and had labs drawn 1 week later which were unremarkable, per patient. She  then began to experience relief from abdominal fullness. She also reports that around this time she also started eating black spanish radish, athletic greens, and states she has had relief of symptoms. She also began to drink dandelion tea BID, eat organic foods, and avoid gluten. Ann Duran notes less abdominal bloating since implementing these lifestyle changes and reports 10 lbs weight loss. Denies making salt or fluid intake changes, however she reports she does monitor her salt intake.     No fluid goal in place by cardiology. In the past 3-4 weeks she reports an increase in urine output waking up up to 3 times at night to urinate. Lasix in the morning and spirolactone in the afternoon. No swelling in the feet noted when flying. No recent changes in medications.    Ann Duran does endorse nosebleeds, but states she lives in an area with dry weather in [place], but denies them occurring frequently. She also reports bruising easily. Denies history of GI bleed, no heme positive stools.      Liver biopsy done at [facility] last summer 2022.     ****    Review of Systems:  A complete review of systems was performed with pertinent positives noted above.      Past Medical History:   Diagnosis Date   ? Anxiety     controlled per pt   ? Cholelithiases     Noted on ultrasound, asymptomatic    ? Delayed emergence from general anesthesia 2010    pacemaker insertion, per patient   ? DORV (double outlet right ventricle)    ? GERD (gastroesophageal reflux disease)     well controlled w/med and diet   ? History of blood transfusion    ? HPV (human papilloma virus) infection    ? IUD (intrauterine device) in place     Mirena placed 12/27/10, replaced 12/2015, replaced November 16, 2016   ? Pacemaker     in abd         Past Surgical History:   Procedure Laterality Date   ? CERVICAL BIOPSY  W/ LOOP ELECTRODE EXCISION 11/16/2016    Also, Mirena IUD replacement as well as excision of vulvar skin   ? Diaphragmatic plication  2010   ? Exam under anesthesia; hysteroscopy; dilation and curettage; placement of a Mirena intrauterine device  12/27/10   ? FONTAN PROCEDURE  1982    Modified   ? INSERT / REPLACE / REMOVE PACEMAKER  2009    Revise Fontan, Maze procedure     Medications that the patient states to be currently taking   Medication Sig   ? Ascorbic Acid (VITAMIN C) 1000 MG tablet Take 1 tablet (1,000 mg total) by mouth as needed for.   ? aspirin 81 mg EC tablet Take 1 tablet (81 mg total) by mouth daily.   ? clonazePAM 0.5 mg tablet TAKE 1 TABLET(0.5 MG) BY MOUTH AT NIGHT AS NEEDED FOR INSOMNIA   ? cyanocobalamin 1000 mcg tablet Take 1 tablet (1,000 mcg total) by mouth as needed for.   ? fluticasone 50 mcg/act nasal spray    ? furosemide 80 mg tablet Take 1  tablet (80 mg total) by mouth daily.   ? lisinopril 2.5 mg tablet Take 1 tablet (2.5 mg total) by mouth daily.   ? pantoprazole 20 mg DR tablet TAKE 1 TABLET BY MOUTH  DAILY AS NEEDED.   ? potassium chloride 10 mEq CR tablet Take 1 tablet (10 mEq total) by mouth daily.   ? sildenafil 20 mg tablet Take 1 tablet (20 mg total) by mouth three (3) times daily.   ? spironolactone 50 mg tablet Take 3 tablets (150 mg total) by mouth daily.   ? valacyclovir 500 mg tablet ValACYclovir HCl - 500 MG Oral Tablet   ? zolpidem 5 mg tablet Take 0.5 tablets (2.5 mg total) by mouth at bedtime as needed for Sleep.           No Known Allergies          Labs:    Latest Reference Range & Units 05/31/22 11:19   White Blood Cell Count 3.8 - 10.8 Thousand/uL 3.3 (L)   Hemoglobin 11.7 - 15.5 g/dL 69.6   Hematocrit 29.5 - 45.0 % 39.9   Red Blood Cell Count 3.80 - 5.10 Million/uL 4.59   Mean Corpuscular Volume 80.0 - 100.0 fL 86.9   Mean Corpuscular Hemoglobin 27.0 - 33.0 pg 28.8   MCH Concentration 32.0 - 36.0 g/dL 28.4   Red Cell Distribution Width-CV 11.0 - 15.0 % 12.9   Platelet Count, Auto 140 - 400 Thousand/uL 107 (L)   Mean Platelet Volume 7.5 - 12.5 fL 12.0   Prothrombin Time 9.0 - 11.5 sec 11.6 (H)   INR  1.1   Sodium 135 - 146 mmol/L 135   Potassium 3.5 - 5.3 mmol/L 4.4   Chloride 98 - 110 mmol/L 101   Total CO2 20 - 32 mmol/L 25   Urea Nitrogen 7 - 25 mg/dL 21   Creatinine 1.32 - 0.99 mg/dL 4.40   BUN/Creatinine Ratio ([facility]) 6 - 22 (calc) NOT APPLICABLE   Glucose 65 - 99 mg/dL 65   Calcium 8.6 - 10.2 mg/dL 9.9   AST (SGOT) 10 - 30 U/L 21   ALT (SGPT) 6 - 29 U/L 19   Alkaline Phosphatase 31 - 125 U/L 68   Bilirubin, Direct ([facility]) < OR = 0.2 mg/dL 0.3 (H)   Bilirubin, Indirect (Quest) 0.2 - 1.2 mg/dL (calc) 0.7   Bilirubin,Total 0.2 - 1.2 mg/dL 1.0   BNP <725 pg/mL 68   Albumin 3.6 - 5.1 g/dL 5.5 (H)   Albumin/Globulin Ratio (Quest) 1.0 - 2.5 (calc) 2.0   TOTAL PROTEIN 6.1 - 8.1 g/dL 8.2 (H)   Globulin (Quest) 1.9 - 3.7 g/dL (calc) 2.7   Alpha Fetoprotein, Tumor Marker (Quest) ng/mL 3.6   CA 19-9 (Quest) <34 U/mL 14   eGFR > OR = 60 mL/min/1.60m2 85   GGT (Quest) 3 - 55 U/L 108 (H)   Prealbumin (Quest) 17 - 34 mg/dL 27   (L): Data is abnormally low  (H): Data is abnormally high      Imaging:   UTZ 04/2022  IMPRESSION:  ?  1. Coarsening of hepatic echotexture with irregularity in surface contour suggesting fibrosis/cirrhosis. No suspicious liver lesions.  ?  2. Cholelithiasis without evidence of cholecystitis.  ?  3. Splenomegaly suggesting portal hypertension.     PATHOLOGY:   08/2017  LIVER (TRANSJUGULAR BIOPSY):  - Congestive hepatopathy with extensive bridging fibrosis

## 2022-08-29 NOTE — Patient Instructions
Labs drawn next visit   Continue to eat healthy and exercise regularly  In case of headache you can take Tylenol   Alcohol in moderation  If you have any questions about medications prescribed please contact me via email or MyChart  Follow up in 1 year

## 2022-10-12 ENCOUNTER — Ambulatory Visit: Payer: BLUE CROSS/BLUE SHIELD

## 2022-10-12 DIAGNOSIS — R001 Bradycardia, unspecified: Secondary | ICD-10-CM

## 2022-10-12 DIAGNOSIS — Z950 Presence of cardiac pacemaker: Secondary | ICD-10-CM

## 2022-10-13 MED ORDER — SPIRONOLACTONE 50 MG PO TABS
ORAL_TABLET | ORAL | 3 refills | 30.00000 days
Start: 2022-10-13 — End: ?

## 2022-10-15 MED ORDER — SPIRONOLACTONE 50 MG PO TABS
ORAL_TABLET | 1 refills | Status: AC
Start: 2022-10-15 — End: ?

## 2022-11-07 ENCOUNTER — Inpatient Hospital Stay: Payer: BLUE CROSS/BLUE SHIELD

## 2022-11-07 DIAGNOSIS — R001 Bradycardia, unspecified: Secondary | ICD-10-CM

## 2022-11-07 DIAGNOSIS — Z950 Presence of cardiac pacemaker: Secondary | ICD-10-CM

## 2022-11-07 NOTE — Progress Notes
SEE [place] FOR DOWNLOAD PRINTOUT     [name] is a 47 y.o. female  [phone]  Reading MD:  Dr Larene Beach    A Medtronic Carelink remote monitor was received as a routine download, device function is normal. Awaiting transfer to [place]. See Media for full report.

## 2022-12-01 MED ORDER — PANTOPRAZOLE SODIUM 20 MG PO TBEC
ORAL_TABLET | 3 refills
Start: 2022-12-01 — End: ?

## 2022-12-01 MED ORDER — FUROSEMIDE 80 MG PO TABS
80 mg | ORAL_TABLET | Freq: Every day | ORAL | 3 refills
Start: 2022-12-01 — End: ?

## 2022-12-01 MED ORDER — POTASSIUM CHLORIDE CRYS ER 10 MEQ PO TBCR
10 meq | ORAL_TABLET | Freq: Every day | ORAL | 3 refills
Start: 2022-12-01 — End: ?

## 2022-12-03 MED ORDER — PANTOPRAZOLE SODIUM 20 MG PO TBEC
ORAL_TABLET | 3 refills | Status: AC
Start: 2022-12-03 — End: ?

## 2022-12-03 MED ORDER — POTASSIUM CHLORIDE CRYS ER 10 MEQ PO TBCR
10 meq | ORAL_TABLET | Freq: Every day | ORAL | 3 refills | Status: AC
Start: 2022-12-03 — End: ?

## 2022-12-03 MED ORDER — FUROSEMIDE 80 MG PO TABS
80 mg | ORAL_TABLET | Freq: Every day | ORAL | 3 refills | Status: AC
Start: 2022-12-03 — End: ?

## 2022-12-07 ENCOUNTER — Telehealth: Payer: BLUE CROSS/BLUE SHIELD

## 2022-12-08 NOTE — Telephone Encounter
Reviewed EKG that was sent in with patient. She is planning on starting an SSRI or Welbutrin and baseline EKG showed QT/QTc 500/540 ms. She has a BBB so recalculated it taking into account the BBB and QTc was 435 ms, which is at an acceptable range for starting an SSRI. She will plan to get another EKG a week after starting SSRI.   She is in agreement with this plan and all questions answered.

## 2022-12-09 MED ORDER — SPIRONOLACTONE 50 MG PO TABS
ORAL_TABLET | ORAL | 5 refills | 30.00000 days
Start: 2022-12-09 — End: ?

## 2022-12-10 MED ORDER — SPIRONOLACTONE 50 MG PO TABS
ORAL_TABLET | 3 refills | Status: AC
Start: 2022-12-10 — End: ?

## 2022-12-17 ENCOUNTER — Telehealth: Payer: BLUE CROSS/BLUE SHIELD

## 2022-12-17 NOTE — Telephone Encounter
Spoke with patient and reviewed SSRI antidepressant medications. She has had a difficult time during the [place] winter and is looking to possible be on this short term until the season changes. She is looking to start Citalopram and completed a baseline EKG. The calculated QTc for RBBB/VP was 435 ms. Advised that she would be safe to start and will need another EKG 1 week after starting and if there are any increase in dose. She will contact us after the EKG is completed.   She is in agreement with this plan and all questions answered.

## 2023-02-06 ENCOUNTER — Inpatient Hospital Stay: Payer: BLUE CROSS/BLUE SHIELD

## 2023-02-06 DIAGNOSIS — Z950 Presence of cardiac pacemaker: Secondary | ICD-10-CM

## 2023-02-06 DIAGNOSIS — R001 Bradycardia, unspecified: Secondary | ICD-10-CM

## 2023-02-07 NOTE — Procedures
SEE MEDIA SECTION FOR DOWNLOAD PRINTOUT     Ann Duran is a 47 y.o. female  [id number]  Reading MD:  Dr Carollee Herter    A Medtronic Carelink remote monitor was received as a routine download, Device function is normal with chronic high RV threshold that are unchanged. Pending transferr of care to [place].  See Media for full report.

## 2023-02-22 ENCOUNTER — Ambulatory Visit: Payer: BLUE CROSS/BLUE SHIELD | Attending: Cardiovascular Disease

## 2023-03-22 ENCOUNTER — Ambulatory Visit: Payer: BLUE CROSS/BLUE SHIELD

## 2023-03-22 MED ORDER — SILDENAFIL CITRATE 20 MG PO TABS
20 mg | ORAL_TABLET | Freq: Three times a day (TID) | ORAL | 3 refills | Status: AC
Start: 2023-03-22 — End: ?

## 2023-04-09 ENCOUNTER — Telehealth: Payer: BLUE CROSS/BLUE SHIELD

## 2023-04-09 ENCOUNTER — Ambulatory Visit: Payer: BLUE CROSS/BLUE SHIELD

## 2023-04-09 DIAGNOSIS — Z950 Presence of cardiac pacemaker: Secondary | ICD-10-CM

## 2023-04-09 DIAGNOSIS — R001 Bradycardia, unspecified: Secondary | ICD-10-CM

## 2023-04-09 NOTE — Telephone Encounter
Spoke with Ann Duran, she is aware of her scheduled appts on 7/23 and has confirmed.     She is also aware of Dr. Tobie Poet Video Visit on 7/30.

## 2023-04-10 ENCOUNTER — Telehealth: Payer: BLUE CROSS/BLUE SHIELD

## 2023-04-10 DIAGNOSIS — Z9889 Other specified postprocedural states: Secondary | ICD-10-CM

## 2023-04-10 DIAGNOSIS — Q201 Double outlet right ventricle: Secondary | ICD-10-CM

## 2023-04-10 DIAGNOSIS — Z950 Presence of cardiac pacemaker: Secondary | ICD-10-CM

## 2023-04-10 DIAGNOSIS — Q204 Double inlet ventricle: Secondary | ICD-10-CM

## 2023-04-10 MED ORDER — FUROSEMIDE 80 MG PO TABS
80 mg | ORAL_TABLET | Freq: Every day | ORAL | 0 refills | Status: AC
Start: 2023-04-10 — End: ?

## 2023-04-10 NOTE — Telephone Encounter
Call Back Request      Reason for call back: Patient forgot her Furosemide 80 mg tablet while traveling to [place] and has missed two doses. She is either asking for 3 day supply of pills to be sent to [facility] [address] [facility] pharmacy or advice if its okay to miss multiple doses because she is flying back home tomorrow. Please advise as soon as possible.     Any Symptoms:  []  Yes  [x]  No      If yes, what symptoms are you experiencing:    Duration of symptoms (how long):    Have you taken medication for symptoms (OTC or Rx):      If call was taken outside of clinic hours:    [] Patient or caller has been notified that this message was sent outside of normal clinic hours.     [] Patient or caller has been warm transferred to the physician's answering service. If applicable, patient or caller informed to please call us back if symptoms progress.  Patient or caller has been notified of the turnaround time of 1-2 business day(s).

## 2023-04-22 ENCOUNTER — Telehealth: Payer: BLUE CROSS/BLUE SHIELD

## 2023-04-22 NOTE — Telephone Encounter
It is best for her to see Dr. Carollee Herter along with the visit.  Including [facility] Scheduling to assist with arranging for

## 2023-04-22 NOTE — Telephone Encounter
Patient will be in [place]on 9/11 for [facility] appts.    Dr. Carollee Herter isn't seeing pts with [place] on this day.    Would it be possible for her to just have a device check with no MD appt?

## 2023-04-23 NOTE — Telephone Encounter
Can you please assist with arranging a follow up with Dr. Carollee Herter.  Thank you

## 2023-04-25 ENCOUNTER — Ambulatory Visit: Payer: BLUE CROSS/BLUE SHIELD

## 2023-04-25 DIAGNOSIS — Z9889 Other specified postprocedural states: Secondary | ICD-10-CM

## 2023-04-25 DIAGNOSIS — Q204 Double inlet ventricle: Secondary | ICD-10-CM

## 2023-04-25 DIAGNOSIS — Q201 Double outlet right ventricle: Secondary | ICD-10-CM

## 2023-04-25 DIAGNOSIS — Z950 Presence of cardiac pacemaker: Secondary | ICD-10-CM

## 2023-04-25 MED ORDER — FUROSEMIDE 80 MG PO TABS
80 mg | ORAL_TABLET | Freq: Every day | ORAL | 3 refills | Status: AC
Start: 2023-04-25 — End: ?

## 2023-05-08 ENCOUNTER — Inpatient Hospital Stay: Payer: BLUE CROSS/BLUE SHIELD

## 2023-05-08 DIAGNOSIS — R001 Bradycardia, unspecified: Secondary | ICD-10-CM

## 2023-05-08 DIAGNOSIS — Z950 Presence of cardiac pacemaker: Secondary | ICD-10-CM

## 2023-05-08 NOTE — Procedures
SEE MEDIA SECTION FOR DOWNLOAD PRINTOUT     Ann Duran is a 47 y.o. female  [id number]  [place] MD:  Dr Carollee Herter    A Medtronic Carelink remote monitor was received as a routine download. Device function is unchanged with chronically elevated RV thresholds. Continue remote monitoring Q3 months. Next in office visit on 07/17/2023. See Media for full report.

## 2023-05-24 ENCOUNTER — Telehealth: Payer: BLUE CROSS/BLUE SHIELD

## 2023-05-24 NOTE — Addendum Note
Addended by: Tacey Ruiz on: 05/24/2023 04:28 PM     Modules accepted: Orders

## 2023-05-24 NOTE — Telephone Encounter
Pt did not answer, LVM for pt, she is scheduled:     Monday 05/27/2023  1) Echo at 9am  2) Office Visit w/Dr. Juel Burrow at 1:30pm

## 2023-05-24 NOTE — Telephone Encounter
I spoke to pt - starting July 5 she started to notice abd swelling, mild L edema, facial edema in morning. Has gained 3 lbs since 7/5. Feels fatigued, low endurance, mild SOB, 2 mild episodes of dizziness (but this is not unusual). Denies palpitations, CP, syncope. Denies constipation, feels full after 3 - 4 bites. She is in [facility] until Monday, her [facility] provider in [place] left a year ago, so she is without an cardiologist in [place]. She will stay in town longer if needed.

## 2023-05-24 NOTE — Addendum Note
Addended by: Sharyon Cable on: 05/24/2023 04:26 PM     Modules accepted: Orders

## 2023-05-27 ENCOUNTER — Ambulatory Visit: Payer: BLUE CROSS/BLUE SHIELD

## 2023-05-27 DIAGNOSIS — Q201 Double outlet right ventricle: Secondary | ICD-10-CM

## 2023-05-27 DIAGNOSIS — Z950 Presence of cardiac pacemaker: Secondary | ICD-10-CM

## 2023-05-27 DIAGNOSIS — Z9889 Other specified postprocedural states: Secondary | ICD-10-CM

## 2023-05-27 DIAGNOSIS — Q204 Double inlet ventricle: Secondary | ICD-10-CM

## 2023-05-27 MED ORDER — SILDENAFIL CITRATE 20 MG PO TABS
20 mg | ORAL_TABLET | Freq: Three times a day (TID) | ORAL | 3 refills | Status: AC
Start: 2023-05-27 — End: ?

## 2023-05-27 MED ORDER — POTASSIUM CHLORIDE CRYS ER 10 MEQ PO TBCR
10 meq | ORAL_TABLET | Freq: Every day | ORAL | 3 refills | Status: AC
Start: 2023-05-27 — End: ?

## 2023-05-27 MED ORDER — FUROSEMIDE 80 MG PO TABS
ORAL_TABLET | 3 refills | Status: AC
Start: 2023-05-27 — End: ?

## 2023-05-27 MED ORDER — SPIRONOLACTONE 50 MG PO TABS
150 mg | ORAL_TABLET | Freq: Every day | ORAL | 3 refills | Status: AC
Start: 2023-05-27 — End: ?

## 2023-05-27 MED ORDER — ASPIRIN 81 MG PO TBEC
81 mg | ORAL_TABLET | Freq: Every day | ORAL | 3 refills | Status: AC
Start: 2023-05-27 — End: ?

## 2023-05-27 MED ORDER — LISINOPRIL 2.5 MG PO TABS
2.5 mg | ORAL_TABLET | Freq: Every day | ORAL | 3 refills | 30.00000 days | Status: AC
Start: 2023-05-27 — End: ?

## 2023-05-27 NOTE — Patient Instructions
1) Keep on taking Lasix 80mg  in the morning and 40mg  in the afternoon for at least the next week, weighing yourself daily.    2) Please monitor your salt intake and try to be mindfulness of how much you take in. In general, eating out at restaurants will lead to more salt intake. Try to avoid soups and foods such as pho/noodle soups which have added sodium. Processed foods, canned foods, breads, cheeses and prepared meats can be very high sodium. Any prepared foods or restaurant food is much higher in sodium content. For example, a prepared rotisserie chicken will have more salt content compared to chicken that you prepare yourself. Okay to keep eating fruit such as watermelon. [employer] ''34 Salute'' salt-free seasoning may be a tasty seasoning alternative.  3) Your goal sodium intake is 2Grams per day. On the back of the nutritional label, usually there is a sodium content per serving (in milligrams). 1000 milligrams = 1 Gram. Therefore your goal is less than 2000 milligrams of sodium per day.   4) Your echocardiogram otherwise looks stable and reassuring today; with stable pumping function and valve function compared to prior. Your blood work was also reassuring, with stable kidney function and liver function.   5) We have had some success with a newer class of medications called SGLT2 inhibitors (such as Jardiance or [place]).   6) Otherwise we will see you back in clinic with Dr. Tobie Poet on 07/17/23, with repeat liver ultrasound. If we can schedule a CT liver for you, that would replace the liver ultrasound.

## 2023-05-27 NOTE — Progress Notes
[facility]     Date of Visit: 05/27/2023   Reason for Visit: Follow up s/p cardiac catheterization on 08/08/17, in setting of DORV s/p Fontan conversion to extracardiac baffle on 11/21/07, and subsequent placement of epicardial permanent pacemaker.     HPI: Ann Duran is a 47 y.o. 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  Underwent modified Fontan procedure (patch connection of the IVC/SVC via the right atrium to MPA) at age 48 yrs at the [facility].  Postoperative chylothorax in1982, subsequently requiring exploration by a left thoracotomy, with ligation of lymphatic vessel and thoracic duct.  Suffered fatigue and exercise intolerance in November 2008, and was found to be in atrial fibrillation.  Underwent cardioversion at [facility] by Dr. Charlesetta Garibaldi on 10/14/07, along with cardiac cath which revealed excellent RA/Fontan pressures with a mean of 12-62mmHg.  Underwent Fontan conversion on 11/21/07, involving takedown of previous Fontan and placement of an extracardiac Fontan and bidirectional Sherrine Maples shunt and placement of epicardial permanent pacing leads. Discharged on 11/29/07  Re-admitted on 12/03/07 for thoracentesis of large right pleural effusion, in the setting of bradycardia and junctional rhythm  Underwent surgical placement of abdominal pacemaker generator on 12/05/07, but atrial lead was non-capturing, left with single site ventricular pacing.  Discharged on 12/12/07, after aggressive diuresis in the setting of significant ascites and perineal edema.  Subsequent diuresis over the next 6 weeks with resolution of ascites on moderate dose lasix and moderate dose aldactone.  Underwent left hemidiaphragm plication by Dr. Larwance Sachs on 03/02/2009, unsuccessful attempt at placement of an atrial lead.  PPM rate set at VVI 90 bpm during the hospitalization, reduced to 60 bpm by Dr. Carollee Herter on 03/29/2009.  In the setting of poor chronotropic response to exercise, rate response activated by Dr. Carollee Herter in July 2011 with significantly improved exercise ability.  Underwent placement of Mirena IUD and D & C by Dr. Ardyth Man Parvatenini in February 2012. No cardiovascular complications.  Mirena IUD replaced with LEEP procedure in January 2018  Good functional capacity with MVO2 of 24.8 (88% predicted) in August 2016.  Low normal single RV function, improves with exercise.  No exercise induced arrhythmias  V-paced 99% of time at rate of 70, generator nearing ERI as of May 2018 remote check, requiring monthly checks to detect ERI.   Liver US in Sept 2017 showed no lesions but METAVIR score was 4 consistent with cirrhosis.  Underwent cardiac cath on 08/08/17 by Dr. Tobie Poet, showing mean Fontan pressures of , and liver biopsy showing extensive bridging fibrosis, but no cirrhosis.  Spirinolactone dose was doubled from 25mg  daily to 50mg  daily in an attempt to reduce Fontan volume pressure.  Underwent pacemaker generator replacement ([employer]) on 12/12/17 by Dr. Christeen Douglas to [place] for work in early 2020, had issues with the humidity, insomnia and had some exertional decline.   06/01/2021  cardiac catheterization & Liver Biopsy  by Dr. 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     Interval History: Ann Duran presents today for a follow up visit to evaluate weight gain and edema, accompanied by her mother.  Normally followed by Dr. Tobie Poet. She was last seen 08/03/22. Since then:    -Her [facility] provider in [place] left a year ago,  so she is without a local cardiologist.  - She called our clinic on 05/24/23 reporting 3lbs of weight gain since 05/10/23.   - Her last pacemaker interrogation was 05/04/23 showed 99.8% V-pacing with no arrhythmias.  - She is overdue for liver ultrasound, last was 05/25/22 showed hepatic coarsening with no masses. Currently scheduled for 07/17/23; has not had abdominal advanced imaging since 2009.  - Was on vacation the week before last in [place]; she noticed her abdomen was more distended, and she was feeling more lethargic. This has been ongoing for the last month. She feels her abdomen is much harder, and she feels bloated all the time.   - Her diet usually consists of lean meat, salad, hard boiled eggs, gluten-free noodles, usually cooking for herself at home. She notes that she has been eating out a lot more since traveling this past month. She expresses affinity for soups and noodles (ramen, pho, etc) which are unfortunately high-sodium foods.   - Her Lasix was increased to 80/40 for Saturday - Monday per [facility] clinic instructions, which has not helped with symptoms.  - She also has been feeling more shortness of breath with exertion. She can still walk indefinitely and walk up the 4-5 flights of stairs to her office.  - She uses the treadmill for 25-40 minutes at a time at least once a week; a few days ago she was still alternating running and walking for 2 minutes at a time with no issue. Overall her stamina this past month is slightly worse than before but overall stable.   - She has not had any palpitations, orthopnea, PND.   - She sees a dentist q61months and uses Amoxicillin for SBE prophylaxis.    Past Medical History: As above.  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 Darlyn Chamber here at [facility].  LEEP in Jan 2018.  Mirena IUD replaced in Jan 2018.  Nose bleeds s/p bipolar cauterization 10/2016    Allergies: No Known Allergies     Medications:   Medications that the patient states to be currently taking   Medication Sig    acetaminophen 325 mg tablet Take 2 tablets (650 mg total) by mouth.    amoxicillin 500 mg tablet     Ascorbic Acid (VITAMIN C) 1000 MG tablet Take 1 tablet (1,000 mg total) by mouth as needed for.    aspirin 81 mg EC tablet Take 1 tablet (81 mg total) by mouth daily.    Cholecalciferol (VITAMIN D) 125 MCG (5000 UT) CAPS Take by mouth.    citalopram 20 mg tablet Take 1 tablet (20 mg total) by mouth.    clonazePAM 0.5 mg tablet TAKE 1 TABLET(0.5 MG) BY MOUTH AT NIGHT AS NEEDED FOR INSOMNIA    cyanocobalamin 1000 mcg tablet Take 1 tablet (1,000 mcg total) by mouth as needed for.    fluticasone 50 mcg/act nasal spray     furosemide 80 mg tablet Take 1 tablet (80 mg total) by mouth daily.    lisinopril 2.5 mg tablet Take 1 tablet (2.5 mg total) by mouth daily.    PANTOPRAZOLE 20 mg DR tablet TAKE 1 TABLET BY MOUTH DAILY AS  NEEDED    POTASSIUM CHLORIDE 10 MEQ tablet TAKE 1 TABLET BY MOUTH DAILY    sildenafil 20 mg tablet Take 1 tablet (20 mg total) by mouth three (3) times daily.    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  Nonsmoker, occas ETOH.    Family History: Mother and father in their 35s in good health. Siblings, she has1 brother who is in good health. There is no history of congenital heart disease in the family.    ROS: Negative and noncontributory except as noted above in HPI and Interval Events.     Physical Exam:  VS: BP 106/66 (BP Location: Right arm, Patient Position: Sitting, Cuff Size: Regular)  ~ Pulse 71  ~ Temp 36.4 ?C (97.6 ?F) (Forehead)  ~ Resp 18  ~ Ht 5' 5'' (1.651 m)  ~ Wt 141 lb 4.8 oz (64.1 kg)  ~ SpO2 (!) 92%  ~ BMI 23.51 kg/m?    General: Pleasant woman, in NAD.  Skin: No rashes. Sternotomy and left thoracotomy scars well healed.  Head and Neck: Pupils are equal and reacting.   Mouth: Normal oral mucosa. Excellent dentition  Neck:Jugular venous pressure ~12cm  Chest:  Lungs clear bilaterally   Heart: Single S1 with no systolic murmurs or rubs. No diastolic murmurs or gallops.  Abdomen: Mildly distended, no fluid wave or ascites. Soft, non-tender.  Extremities: No edema, mild varicosity on inner thighs and lower right leg.   Neurologic: Alert and oriented x 4.  Psychologic: Normal mood and affect.    Cardiac Diagnostic Data:    Abdominal Ultrasound 05/25/2022  CLINICAL HISTORY: s/p Fontan, please assess for nodules, lesions and absesses.   COMPARISON: Abdominal ultrasound dated 05/23/2018.   TECHNIQUE: Real time grayscale and color Doppler images of the abdominal organs were obtained using a curved transducer.   FINDINGS:   Pancreas: Partially visualized and unremarkable.   Liver: Coarse in echotexture with irregular surface contour.   Focal liver lesions: None.   Portal vein: Normal, hepatopetal flow.   Gallbladder: Normally distended, containing gallstones. No gallbladder wall thickening. No sonographic Murphy's sign.   Bile ducts: Normal in caliber.   Kidneys: Normal in size with normal cortical thickness and echogenicity. No hydronephrosis.   Spleen: Splenomegaly.   Ascites: None in the upper abdomen.   Visualized proximal aorta and IVC: Unremarkable.   MEASUREMENTS: Liver: 14.6 cm Common Duct: 4.8 mm Right Kidney: 11.5 cm Left Kidney: 12.1 cm Spleen: 17.8 cm Aorta: 1.6 cm      IMPRESSION:    1. Coarsening of hepatic echotexture with irregularity in surface contour suggesting fibrosis/cirrhosis. No suspicious liver lesions.    2. Cholelithiasis without evidence of cholecystitis.    3. Splenomegaly suggesting portal hypertension.     Echocardiogram 05/25/2022  PHYSICIAN INTERPRETATION: CONOTRUNCAL ANATOMY: The cardiac structural malformations are consistent with a diagnosis of levo-transposition of the great arteries. LEFT VENTRICLE: Patient demonstrated normal sinus rhythm during echocardiogram. 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 RIGHT ATRIUM: Large secundum ASD. Status post extracardiac Fontan. Visualized portions of the Fontan appear patent, no obvious fenestration noted. Sherrine Maples shunt appears widely patent with low velocity flow noted. MITRAL VALVE: Trace mitral valve regurgitation. TRICUSPID VALVE: Trace tricuspid valve regurgitation. AORTIC VALVE: No evidence of aortic valve regurgitation. AORTA: No coarctation of the aorta. PERICARDIUM: There is no evidence of pericardial effusion. CONCLUSIONS  1. 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  2. 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 Low normal systemic RV systolic function.  3. Large secundum ASD. Status post extracardiac Fontan. Visualized portions of the Fontan appear patent, no obvious fenestration noted. Sherrine Maples shunt appears normal.  4. L-transposition of the great arteries.  5. A prior echo performed on 08/09/2020 was reviewed for comparison. No significant changes noted since the previous study.    06/01/2021 Cardiac Catheterization [facility]- Dr. Bradd Canary                 Liver Biopsy 06/01/2021 [facility]- Dr. Bradd Canary       Echocardiogram 08/09/20- reviewed by me:  1. 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   2. Double out right ventricle with hypoplastic LV and large VSD with'' functional single ventricle'' Right ventricular hypertrophy. Low normal systemic RV systolic function.   3. Large secundum ASD. Status post extracardiac Fontan. Visualized portions of the Fontan appear patent, no obvious fenestration noted. Sherrine Maples shunt appears normal.   4. L-transposition of the great arteries.   5. Large secundum ASD. Status post extracardiac Fontan. Visualized portions of the Fontan appear patent, no obvious fenestration noted. Sherrine Maples shunt appears widely patent with low velocity flow noted.   6. A prior echo performed on 07/29/2019 was reviewed for comparison. No significant changes noted since the previous study.    Pacemaker remote transmission 05/07/20:  4.9 years longevity, VVIR mode, device function normal, one episode of possible NSVT (3 beats) on 6/12 after walking 5K, asymptomatic, 99% V pacing.    Echocardiogram 07/29/2019:  1. L-transposition of the great arteries.  2. 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 Low normal systemic RV systolic function.  3. Double outlet right ventricle-doubly commited ventricular septal defect-aorta anterior to pulmonary artery-no left ventricular outflow tract obstruction.  4. 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  5. Large secundum ASD. Status post extracardiac Fontan. Visualized portions of the Fontan appear patent, no obvious fenestration noted. Sherrine Maples shunt appears normal.  6. A prior echo performed on 05/13/2018 was reviewed for comparison. No significant changes noted since the previous study.    CAM  Monitor 04/12/17:  Predominant rhythm: Paced   Paced rhythm   Ventricular Ectopy = (<1%)  1802 isolated, unifocal PVCs and 208 pairs   Atrial Ectopy = (<1%)  1 isolated PAC  Predominantly V paced rhythm with rare supraventricular beats  (most are labeled PVC's). Symptoms of ''Chest discomfort'  correlated with 2 narrow complex beats/. Symptoms of  Dizziness/Lightheadedness correlated with ventricular pacing.  One of four button presses correlated with a narrow complex  beat, the remaining button presses correlated with ventricular  pacing.    Echocardiogram:  05/13/18  PHYSICIAN INTERPRETATION:  CONOTRUNCAL ANATOMY: The cardiac structural malformations are consistent with a diagnosis of levo-transposition of the great arteries.  LEFT VENTRICLE: Patient demonstrated normal sinus rhythm during echocardiogram. 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  RIGHT VENTRICLE: Double out right ventricle with hypoplastic LV and large VSD with '' functional single ventricle'' Right ventricular hypertrophy. Low normal systemic RV systolic function.  LEFT ATRIUM:  RIGHT ATRIUM: Right atrial pressure is estimated at 8 mmHg. Right atrial pressure is estimated at 8 mmHg. Large secundum ASD. Status post extracardiac Fontan. Visualized portions of the Fontan appear patent, no obvious fenestration noted. Sherrine Maples shunt   appears normal.  MITRAL VALVE: No evidence of mitral valve regurgitation.  TRICUSPID VALVE: Trace tricuspid valve regurgitation.  AORTIC VALVE: The aortic valve was not well visualized. Aortic  valve area was not quantified by continuity equation on this study. Aorta is anterior of the pulmonic valve.  No evidence of aortic regurgitation.  PULMONIC VALVE: No indication of pulmonic valve regurgitation. Pulmonic is posterior of the aorta.  AORTA: Normal aortic arch, normal descending aorta, normal mid ascending aorta and the aortic root is normal in size and structure. No coarctation of the aorta.  PULMONARY ARTERY: The pulmonary artery is not well seen.  SYSTEMIC VEINS: The inferior vena cava is normal in size and exhibits less than 50% respiratory change.  PERICARDIUM: There is no evidence of pericardial effusion.  CONCLUSIONS:   1. L-transposition of the great arteries.   2. 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 Low normal systemic RV systolic function.   3. Double outlet right ventricle-doubly commited ventricular septal defect-aorta anterior to pulmonary artery-no left ventricular outflow tract obstruction.   4. 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   5. Large secundum ASD. Status post extracardiac Fontan. Visualized portions of the Fontan appear patent, no obvious fenestration noted. Sherrine Maples shunt appears normal.   6. A prior echo performed on 05/07/2017 was reviewed for comparison. No significant changes noted since the previous study.    Stress Echo/CPX:  05/13/18  BASELINE ECG: Ventricular paced with biphasic T wave in lead V2  PROTOCOL: Bruce. (Treadmill advanced to stage 3 on its own during stage 2 of exercise after 1')  CONTROL:   HR:  72    BP: 110/64     O2 Sat:  98% (forehead) 92% (finger)   START:  1.7 MPH at 10% grade.   STOP: 11.4 METS with 3.4 mph at 14% grade x 2?37 after 7?37'' total exercise time due to shortness of breath.  Peak HR: 129      Peak BP: 128/66         Peak O2 Sat:  94%   Peak Double-Product: 16,512    Maximum VO2 = 1391 ml which is 77% of predicted, maximum VO2/kg = 23.7 ml/kg which is 85% of predicted (Note: The reference values are adjusted for weight and modality of exercise)  VE/VO2 =  40   VE/VCO2 = 36      VE/VCO2 slope = 30.1  VO2/HR =11.1 which is 111% predicted  Breathing Reserve = 41  RQ= 1.12  Anaerobic Threshold was reached at HR = 103 VO2 =  1144 ml     VO2/kg =  19.5 ml/kg  RESULTS:   Symptoms:  Shortness of breath and leg fatigue, denied chest pain  ST-T Changes: Uninterruptable in the presence of ventricular pacing.  Dysrhythmias:  Rare PVCs during exercise  BP Response:  Blunted.  IMPRESSIONS: Fair exercise tolerance achieving 71% maximum predicted heart rate, limited by shortness of breath. ST changes during exercise are uninterpretable in the presence of ventricular pacing. No chest pain. Rare PVCs during exercise. Blunted BP at a low double product. Maximum VO2/kg = 23.7 ml/kg which is 85% of predicted. VE/VCO2 slope = 30.1 which is 119% of predicted. Compared with previous stress/CPX of 06/21/15 exercise tolerance has decreased from 12.7 METS, MVO2/kg had been 24.8, VE/VCO2 slope had been 23.1.  BASELINE:  Baseline: Single ventricle with low normal systolic function.  ADDITIONAL BASELINE FINDINGS:  LEFT VENTRICLE: Global left ventricular systolic function is mildly decreased (LVEF 40-49%).  MITRAL VALVE: Trace mitral valve regurgitation.  TRICUSPID VALVE: Trace tricuspid valve regurgitation.     Global left ventricular function  increased appropriately with stress. No new segmental wall motion abnormalities were seen. Global left ventricular systolic function at peak stress is lower limits of normal (LVEF 50-55%). Mild mitral valve regurgitation.   Mild tricuspid regurgitation. Post-exercise: mild increase in single ventricle contractility.     SUMMARY:   1. Please see separately resulted cardiopulmonary exercise test for details of exercise performance.   2. DORV, L-TGA, hypoplastic LV, large VSD, large ASD.   3. Baseline: Single ventricle with low normal systolic function.   4. Post-exercise: mild increase in single ventricle contractility    Liver US:  05/13/18  IMPRESSION:  1. Irregular liver contour and coarsened in echotexture representing underlying chronic liver disease/fibrosis. No focal mass. Cholelithiasis. Splenomegaly supports portal hypertension. Normal liver Doppler.  2. Shear wave liver stiffness measurement indicating moderate to severe fibrosis (MetaVir stages F2-F3). Note, the morphologic appearance of the liver appears more fibrotic than Elastography measurements indicate.    Cardiac Cath:  08/08/17:      Liver US  07/01/17 (outside report) shows no lesions, Metavir score of 4.    Echocardiogram:  05/07/17 PHYSICIAN INTERPRETATION:  CONOTRUNCAL ANATOMY: The cardiac structural malformations are consistent with a diagnosis of levo-transposition of the great arteries.  LEFT VENTRICLE: Visually estimated left ventricular ejection fraction 55-60%. Patient demonstrated normal sinus rhythm during echocardiogram. 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  RIGHT VENTRICLE: Double out right ventricle with hypoplastic LV and large VSD with '' functional single ventricle'' Right ventricular hypertrophy. Low normal systemic RV systolic function.  RIGHT ATRIUM: Right atrial pressure is estimated at 8 mmHg. Right atrial pressure is estimated at 8 mmHg. Large secundum ASD. Status post extracardiac Fontan. Visualized portions of the Fontan appear patent, no obvious fenestration noted. Sherrine Maples shunt   appears normal.  MITRAL VALVE: No evidence of mitral valve regurgitation.  TRICUSPID VALVE: Mild tricuspid valve regurgitation.  AORTIC VALVE: The aortic valve was not well visualized. Aorta is anterior of the pulmonic valve.  No evidence of aortic regurgitation.  PULMONIC VALVE: No indication of pulmonic valve regurgitation. Pulmonic is posterior of the aorta.  AORTA: Normal aortic arch and normal descending aorta. The ascending aorta was not well visualized. Mildly enlarged aortic root (4.2 cm). No coarctation of the aorta.  PULMONARY ARTERY: The pulmonary artery is not well seen.  SYSTEMIC VEINS: The inferior vena cava is normal in size and exhibits less than 50% respiratory change.  PERICARDIUM: There is no evidence of pericardial effusion.  CONCLUSIONS:   1. L-transposition of the great arteries.   2. Double outlet right ventricle-doubly commited ventricular septal defect-aorta anterior to pulmonary artery-no left ventricular outflow tract obstruction.   3. 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 Low normal systemic RV systolic function.   4. Large secundum ASD. Status post extracardiac Fontan. Visualized portions of the Fontan appear patent, no obvious fenestration noted. Sherrine Maples shunt appears normal.   5. Left ventricular ejection fraction is approximately 55-60%.   6. Mildly enlarged aortic root (4.2 cm).   7. Mild tricuspid valve regurgitation.   8. A prior echo performed on 03/09/2016 was reviewed for comparison. No significant changes noted since the previous study.   9. 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     ECG 11/07/16: Ventricular paced. Ventricular rate of 75 bpn, QRS duration 184 ms. QT/QTc 498/556 ms    Liver Ultrasound 07/27/16 per report from OSH: Liver 17.5 cm in length. It is normal in echodensity.  Liver contour is smooth. No focal lesion os identified. Hepatopedal flow is identifies in the main, right, and left portal veins. Gallbladder and biliary tree: There are several small mobile gallstones. There is no focal tenderness. The gallbladder wall is thickened measuring 0.7 cm. There is no pericholecystic fluid. No intra or extrahepatic biliary ductal dilatation.      Echocardiogram 03/09/16: CONOTRUNCAL ANATOMY: The cardiac structural malformations are consistent with a diagnosis of levo-transposition of the great arteries. 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 The aorta is anterior to the pulmonary artery. There is no sub-aortic obstruction of left ventricular outflow tract. LEFT VENTRICLE: Normal left ventricular size. Visually estimated left ventricular ejection fraction 55-60%. Small left ventricular size. RIGHT VENTRICLE: (DTI 6.0 cm/s). 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 Low normal systemic RV systolic function. LEFT ATRIUM: Mild left atrial enlargement. RIGHT ATRIUM: Right atrial pressure is estimated at 8 mmHg. Large secundum ASD. Status post extracardiac Fontan. Visualized portions of the Fontan appear patent, no obvious fenestration noted. Sherrine Maples shunt appears normal. MITRAL VALVE: No evidence of mitral valve regurgitation. TRICUSPID VALVE: Mild tricuspid valve regurgitation. AORTIC VALVE: The aortic valve is trileaflet and structurally normal, with normal leaflet excursion. Aorta is anterior of the pulmonic valve. No evidence of aortic regurgitation. PULMONIC VALVE: No indication of pulmonic valve regurgitation. Pulmonic is posterior of the aorta. AORTA: Normal mid ascending aorta and normal aortic arch. Mildly enlarged aortic root (3.9 cm). SYSTEMIC VEINS: The inferior vena cava is normal in size and exhibits less than 50% respiratory change. PERICARDIUM: There is no evidence of pericardial effusion.    CONCLUSIONS:  1. 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 Low normal systemic RV systolic function.  2. Left ventricular ejection fraction is approximately 55-60%.  3. Double outlet right ventricle.  4. Double outlet right ventricle     -doubly commited ventricular septal defect     -aorta anterior to pulmonary artery     -no left ventricular outflow tract obstruction.  5. L-transposition of the great arteries.  6. Mild left atrial enlargement.  7. Large secundum ASD. Status post extracardiac Fontan. Visualized portions of the Fontan appear patent, no obvious fenestration noted. Sherrine Maples shunt appears normal.  8. Mild tricuspid valve regurgitation.  9. Mildly enlarged aortic root (3.9 cm).    Abdominal US 06/21/15:  The pancreas is partially visualized and grossly unremarkable. The liver is mildly heterogeneous in echogenicity. There is no focal liver lesion. Portal vein demonstrates hepatopetal flow. No intra- or extrahepatic biliary dilation. The common bile duct is normal in caliber. The gallbladder is normally distended, containing gallstones. No gallbladder wall thickening or pericholecystic fluid. No sonographic Murphy's sign. The spleen is mildly enlarged. The kidneys are normal in size. Both kidneys demonstrate normal cortical thickness and echogenicity. No hydronephrosis. There is no ascites. The visualized proximal aorta and IVC are unremarkable. MEASUREMENTS: Liver: 15.1 cm Common Duct: 3 mm Right Kidney: 10.5 cm Left Kidney: 11.5 cm Spleen: 17 cm Aorta: 1.1 cm SHEAR WAVE LIVER STIFFNESS MEASUREMENTS: Mean 1.63 +/-0.12 m/sec, Median 1.66 m/sec, equating to 5.7-12 kPA. REFERENCE (m/s, kPa): Normal: 0.81-1.22 m/s 2.0-4.5 kPa (METAVIR F0) Normal/mild: 1.22-1.37 m/s 4.5-5.7 kPa (METAVIR F0-F1) Mild/moderate: 1.37-2 m/s 5.7-12.0 kPa (METAVIR F2-F3) Moderate/severe: 2-2.64 m/s 12.0-21.0 kPa (METAVIR F3-F4) Severe: >2.64 m/s >21.0 kPa (METAVIR F4)  IMPRESSION: 1. Heterogenous liver echogenicity, suggesting diffuse liver disease. No suspicious liver lesions. Patent hepatic vasculature. 2. Shear wave liver stiffness measurement indicating mild/moderate liver fibrosis, MetaVir score of F2-3. 3. Splenomegaly. 4. Cholelithiasis without evidence of acute cholecystitis.    Echocardiogram 06/21/15:  2D AND M-MODE MEASUREMENTS (normal ranges within parentheses): Aorta/Left Atrium: Aortic Root, d (2D): 2.8 cm LV DIASTOLIC FUNCTION: MV Peak E: 1.61 m/s E/e' Ratio: 40.2 LV IVRT: 134 msec Decel Time: 229 msec Right Ventricle: TAPSE: 1.0 cm Aortic Valve: AoV Max Vel: 1.05 m/s AoV Peak PG: 4 mmHg AoV Mean PG: Tricuspid Valve and PA/RV Systolic Pressure: TR Max Velocity: 3.2 m/s RA Pressure: 8 mmHg RVSP/PASP: 49 mmHg Pulmonic Valve: PV Max Velocity: 0.8 m/s PV Max PG: 2 mmHg PV Mean PG: Aorta: Ao Asc: 2.4 cm. PHYSICIAN INTERPRETATION: CONOTRUNCAL ANATOMY: The cardiac structural malformations are consistent with a diagnosis of levo-transposition of the great arteries. 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 The aorta is anterior to the pulmonary artery. There is no sub-aortic obstruction of left ventricular outflow tract. LEFT VENTRICLE: Visually estimated left ventricular ejection fraction 55-60%. Small left ventricular size. RIGHT VENTRICLE: TAPSE 1.0 cm, (DTI 6.0 cm/s). 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 Low normal systemic RV systolic function. RIGHT ATRIUM: Right atrial pressure is estimated at 8 mmHg. Large secundum ASD. Status post extracardiac Fontan. Visualized portions of the Fontan appear patent, no obvious fenestration noted. Unable to obtain images of Glenn shunt. MITRAL VALVE: No evidence of mitral valve regurgitation. TRICUSPID VALVE: Mild tricuspid valve regurgitation. Tricuspid regurgitation velocity is not well seen. AORTIC VALVE: The aortic valve is trileaflet and structurally normal, with normal leaflet excursion. Aorta is anterior of the pulmonic valve. No evidence of aortic regurgitation. PULMONIC VALVE: No indication of pulmonic valve regurgitation. Pulmonic is posterior of the aorta. AORTA: The aortic root is normal in size and structure, normal mid ascending aorta and normal aortic arch. SYSTEMIC VEINS: The inferior vena cava is not well visualized. PERICARDIUM: There is no evidence of pericardial effusion. CONCLUSIONS: 1. Small left ventricular size 2. 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 3. L-transposition of the great arteries. 4. Mild tricuspid valve regurgitation. 5. 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 Low normal systemic RV systolic function.     CPEX/Echo 06/11/15:  BASELINE: Low normal systemic RV systolic function at rest. Hypoplastic LV. ADDITIONAL BASELINE FINDINGS: MITRAL VALVE: Trace mitral valve regurgitation. TRICUSPID VALVE: Trace tricuspid valve regurgitation. No new segmental wall motion abnormalities were seen. Global left ventricular systolic function at peak stress is normal (LVEF 60-65%). Improved systemic RV systolic function with stress. No increase in degree of atrioventricular valve regurgitation with exercise. SUMMARY: 1. Improved systemic RV systolic function with stress. No increase in degree of atrioventricular valve regurgitation with exercise. 2. Low normal systemic RV systolic function at rest. Hypoplastic LV. BASELINE ECG: Ventricular paced rhythm PROTOCOL: Bruce. CONTROL: HR: 74 BP: 120/74 O2 Sat: 95 % START: 1.7 MPH at 10% grade. STOP: 12.7 METS with 4.2 mph at 16 % grade x 1?33'' after 10?33'' total exercise time due to shortness of breath. Peak HR: 141 Peak BP: 136/56 Peak O2 Sat: 95 % Peak Double-Product: 19,176 Maximum VO2 = 1386 ml which is  77 % of predicted, maximum VO2/kg = 24.8 ml/kg which is 88 % of predicted (Note: The reference values are adjusted for weight and modality of exercise) VE/VO2 = 35 VE/VCO2 = 31 VE/VCO2 slope = 23.1 VO2/HR = 9.8 which is 99 % predicted Breathing Reserve = 50 RQ= 1.13 Anaerobic Threshold was reached at HR = 103 VO2 = 1094 ml VO2/kg = 19.6 ml/kg RESULTS: Symptoms: Shortness of breath and leg fatigue; no chest pain or discomfort. ST-T Changes: Uninterpretable due to paced rhythm. Dysrhythmias: Rare PVC during exercise BP Response: Appropriate. IMPRESSIONS: Good exercise tolerance achieving 77 % maximum predicted heart rate, limited by shortness of breath. Exercise induced ST changes are uninterpretable due to paced rhythm. No exercise induced chest pain at a low double product. Rare PVCs as described above. Maximum VO2 = 1386 ml which is 77 % of predicted, maximum VO2/kg = 24.8 ml/kg which is 88 % of predicted . VE/VCO2 slope = 23.1 which is 92 % of predicted. Compared with previous stress/CPX of 04/24/13 maximum VO2/kg had been 25.0 at 11.9 METS    Echocardiogram:  04/21/13: CONCLUSIONS:  1. Double outlet right ventricle.  2. Moderately enlarged right ventricular size and low normal systolic function. 3. Moderately increased RV wall thickness. 4. Mild tricuspid regurgitation. 5. Status post extracardiac Fontan, appears widely patent, no fenestration noted. Sherrine Maples shunt not well visualized.    Labs:      05/31/22:  WBC 3.3k, Hgb 13.2, plts 107k, INR 1.1, K 4.4, creat 0.86, TB 1, TP 8.2, AFP 3.6, [facility] 19-9 14, prealbumin 27  05/02/2022 WBC 2.9 Hgb 13.1 Plts 90 Na 135  K 4.3 BUN 19 Cr 0.84 Alb 4.9 AST 24 ALT 19 ALP 74      Component 05/02/22 08/25/20 08/17/20 08/18/18   Auto WBC 2.9 Low  3.0 Low  2.8 Low 5.80   RBC 4.46 4.98 4.93 5.11   Hemoglobin 13.1 14.6 14.1 15.0   Hematocrit 38.7 43.5 43.0 44.5   MCV 87 87 87 87.1   MCH 29.4 29.3 28.6 29.4   MCHC 33.9 33.6 32.8 33.7   RDW 12.3 12.1 12.1 13.8   Platelets 90 Low Panic 90 101 Low   90 Low Panic   89 Low    Hematology Comments: Note:  Note:  Note:  --     08/18/18: WBC 5.8 RBC 5.11 Hgb 15 Hct 44.5 Plt 89 NA 139 K 5.3 BUN 16 Creat 0.81 Gluc 71 BNP 149 TSH 2.39  08/08/17: Creat 0.72, BUN 12, K 3.8  07/01/17:  Chol 121, trig 58, HDL 40, LDL 69, Hgb A1c 5.4, INR 1.2, prealbumin 24, TSH 3.6, Hct 43.2, Plat 82k  07/26/16: WBC 3.0, HGB 4.69, HCT 40.7, PLT 75, NA 137, K 3.8, BUN 13, CREAT 0.7, ALK PHOS 57, AST 30, ALT 38, TBil 1.3, GGT 100, PREALB 24, BNP 113, INR 1.2, TSH 4.24, FT3 3.6, FT4 1.05.  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 Richardo Hanks, including bi-atrial Maze procedure and placement of a permanent atrial pacing lead.  Postoperative junctional rhythm and bradycardia: re-admitted 4 days after discharge with large right pericardial  effusion requiring thoracentesis and placement of an abdominal pacemaker generator and epicardial ventricular lead on 12/05/07 (non-functioning atrial lead).  Ascites, improved on aldactone. No evidence of ascites on annual abdominal US.  Longstanding history of irregular and heavy menses, thus far unsuccessfully regulated on several types of hormonal therapy. Better controlled on Mirena since 2012.  Also has cervical HPV.  Now with CIN III, underwent LEEP procedure in Jan 2018  History of atrial fibrillation, but no post Fontan revision tachyarrhythmias thus far, formerly on coumadin (possible side effect of hair loss), now on ASA.  Longstanding elevated left hemidiapragm, s/p successful plication on 03/02/2009 by Dr. Larwance Sachs.  Liver US in 2014, 2016 and 2017, and 2018 shows no lesions, but 2017 and 2018 outside liver US report Metavir score of 4, which is consistent with cirrhosis.  2019 liver US at [facility] showed MetaVir stage F2-F3, although elastography impacted by congestive hepatopathy.   Cardiac cath and liver biopsy on 08/08/17, with mean Fontan pressure under sedation of , and liver biopsy showing extensive bridging fibrosis but no cirrhosis.  Spirinolactone was increased from 25 to 50mg  to 100mg  after this cath  Ventricular pacemaker, pacing 99% of time.   Generator replaced on 12/12/17 by Dr. Larwance Sachs. Deferred placement of transvenous atrial lead via her LPA to minimize V pacing, based on Audrina's wish not to be committed to anticoagulation  Stable exercise capacity in July 2019, with MVO2 23.7 (85% predicted), previously 24.8 and 88% predicted in 2016    [facility]    Labs (BMP, BNP, LFTs, GGT, AFP, INR, CBC, Prealbumin):  [x]  Within last year (date: 05/25/2023)  []  Ordered today:   []  Deferred:      Hep C screening  [x]  Prior HCV Ab negative (date: 06/21/15)  []  Not indicated (no surgery prior to early 1990s)  []  Ordered    Last hemodynamic cath:   [x]  Within last 10 years (date: 06/01/2021), see above results   []  Deferred. Reason:   []  Scheduled    Last Liver biopsy  [x]  Within last 10 years (date: 06/01/2021), see above results   []  Scheduled with cath    Liver Imaging:  []  MRI Abdomen within last 5 years (date: ), see above  []  Triple phase CT within last 5 years (date: ), see above  [x]  Liver ultrasound completed (date: 05/25/2022), see above  []  Ordered today, see above.     Advanced cardiac imaging:  []  Cardiac MRI (date: ), see above  [x]  Cardiac CT (date: 12/12/2007), see above  [x]  Deferred. Reason: Pacemaker  []  Ordered today, see above     PDE-5 Inhibitor recommended:  [x]  On sildenafil []  On tadalafil  []  Not tolerated. Previously on []  sildenafil (Year: )  []  tadalafil (Year: )   Side effect:  []  Deferred. Reason:   []  Ordered today, see above    Echo  [x]  Within last year (date: 05/25/2022)   []  Deferred. Reason:   []  Ordered today    Cardiopulmonary exercise test  []  Within last 3 years (date: 05/13/18), see above results  []  Deferred. Reason:   []  Ordered today    Advanced care planning  [x]  Date: Advanced directive in CC from 12/27/2010  []  Deferred. Reason:       Recommendations:   Her echocardiogram today shows stable systolic function and no new AV valve regurgitation. Labs from 05/25/23 reassuring, and also notable for BNP of 84.   Her abdominal bloating may be due to increased sodium  intake in her diet since she has been eating out at restaurants more while traveling. We discussed which foods have increased salt and how to be mindful of sodium intake.   Continue Lasix 80mg  qAM and 40mg  qPM for the next week or until symptoms improve. A new prescription was sent to her pharmacy.  Can consider SGLT2i at another visit; patient would like to defer initiation today but is open to this in future.   Ordered a CT A/P for liver surveillance.   Continued hepatology FU with Dr Emerson Monte.   RTC in 2 months for scheduled follow up with Dr. Tobie Poet on 07/17/23. If CT A/P can be scheduled for anytime 07/16/23 - 07/18/23, then we will do this in place of the liver ultrasound. Otherwise will get liver ultrasound on 07/17/23 as planned.     This patient was seen and discussed with Dr. Wyline Beady, supervising [facility] attending.     Kayren Eaves, MD  [facility] Fellow  [facility]  Pager [phone]    Wyline Beady, attending addendum: I saw and examined the patient together with Dr. Kayren Eaves, [facility] fellow.  I agree with the history, findings, assessment and plan as outlined above. Any revisions/modifications are included in the above note.     BILLING ADDENDUM (TIME):  On the day of service I spent 35 minutes in the care of this patient, including for the items checked below.   [x]  Preparing to see the patient (e.g., review of tests, including echocardiograms, CT/MRI, stress tests, EKGs)  [x]  Obtaining and/or reviewing separately obtained history  [x]  Performing a medically appropriate examination and/or evaluation  [x]  Counseling and educating the patient/family/caregiver  [x]  Ordering medications, tests, or procedures  [x]  Referring and communicating with other healthcare professionals (when not separately reported)  [x]  Documenting clinical information in the EHR  [x]  Independently interpreting results and communicating results to patient/family/caregiver    Wyline Beady, MD, [facility]  Clinical Professor of Medicine  [facility]  [address] [facility] [address] ~ [place], [place] [address]  Office [phone] ~Fax: [phone]  Email: [email or web address]

## 2023-05-28 ENCOUNTER — Ambulatory Visit: Payer: BLUE CROSS/BLUE SHIELD | Attending: Cardiovascular Disease

## 2023-05-28 ENCOUNTER — Ambulatory Visit: Payer: BLUE CROSS/BLUE SHIELD

## 2023-05-28 ENCOUNTER — Ambulatory Visit: Payer: BLUE CROSS/BLUE SHIELD | Attending: Pediatric Cardiology

## 2023-06-04 ENCOUNTER — Ambulatory Visit: Payer: BLUE CROSS/BLUE SHIELD | Attending: Cardiovascular Disease

## 2023-06-08 ENCOUNTER — Ambulatory Visit: Payer: BLUE CROSS/BLUE SHIELD

## 2023-06-08 NOTE — Progress Notes
Called patient in response to nursing email over the weekend, patient reports she has enough Lasix for her 80mg  BID dosing to last her until Wednesday when she will get her refill, did not want me to send another prescription to her pharmacy.  We talked a little more about her volume status, and since she's been on the higher dose without much improvement, I asked her to get repeat labs on Monday at her local [facility] to make sure we're not boxing out her kidneys before making more diuretic adjustments.    I told her we should also evaluate for additional etiologies (renal, endocrinology, etc) for her weight gain and volume retention, I ordered some urine studies along with her labs to make sure we're not missing something else.    All questions answered, patient had no additional concerns.

## 2023-06-11 ENCOUNTER — Telehealth: Payer: BLUE CROSS/BLUE SHIELD

## 2023-06-11 NOTE — Progress Notes
I called the patient this afternoon, and instructed her to:   1) Skip her Lasix tomorrow (06/12/23)  2) Starting 06/13/23, go back to Lasix 80mg  qAM.   3) BMP and BNP ordered to [facility], for patient to obtain early next week.    Kayren Eaves, MD  [facility] Fellow  [facility]  Pager [phone]

## 2023-06-11 NOTE — Telephone Encounter
Pt emailed that she increased her lasix due to swelling and had labs completed. Labs reviewed and Cr stable. BNP WNL. Instructed her to decrease the lasix back down to as prescribed, 80mg  QAM (adding 40mg  QPM PRN swelling, bloating, weight gain).

## 2023-06-17 ENCOUNTER — Ambulatory Visit: Payer: BLUE CROSS/BLUE SHIELD

## 2023-06-17 MED ORDER — SILDENAFIL CITRATE 20 MG PO TABS
20 mg | ORAL_TABLET | Freq: Three times a day (TID) | ORAL | 3 refills | Status: AC
Start: 2023-06-17 — End: ?

## 2023-06-18 ENCOUNTER — Telehealth: Payer: BLUE CROSS/BLUE SHIELD

## 2023-06-18 NOTE — Progress Notes
Called the patient regarding their labs: it showed Creatinine downtrending to 1.08 which is evidence of renal recovery. Her K was 5.3, this may have been slight decrease in ability to clear K in the setting of AKI. I asked her to hold her potassium supplementation for the next three days and then to repeat BMP on Monday.     If her BMP on Monday continues to show hyperkalemia, then would stop potassium supplementation altogether, and decrease Spironolactone from 150 --> 100mg .     Kayren Eaves, MD  [facility] Fellow  [facility]  Pager [phone]

## 2023-06-19 ENCOUNTER — Telehealth: Payer: BLUE CROSS/BLUE SHIELD

## 2023-06-19 NOTE — Telephone Encounter
Called patient    Has questions about labs and overall health and medications, etc  All questions answered, verbalized understanding

## 2023-06-19 NOTE — Telephone Encounter
Call Back Request      Reason for call back: Patient called in wanting to speak with the NP of the doctor     Any Symptoms:  []  Yes  [x]  No      If yes, what symptoms are you experiencing:    Duration of symptoms (how long):    Have you taken medication for symptoms (OTC or Rx):      If call was taken outside of clinic hours:    [] Patient or caller has been notified that this message was sent outside of normal clinic hours.     [] Patient or caller has been warm transferred to the physician's answering service. If applicable, patient or caller informed to please call us back if symptoms progress.  Patient or caller has been notified of the turnaround time of 1-2 business day(s).

## 2023-06-20 ENCOUNTER — Ambulatory Visit: Payer: BLUE CROSS/BLUE SHIELD

## 2023-06-20 DIAGNOSIS — R001 Bradycardia, unspecified: Secondary | ICD-10-CM

## 2023-06-20 DIAGNOSIS — Z950 Presence of cardiac pacemaker: Secondary | ICD-10-CM

## 2023-06-27 ENCOUNTER — Ambulatory Visit: Payer: BLUE CROSS/BLUE SHIELD

## 2023-06-27 NOTE — Telephone Encounter
Dr. Orma Render called several times yesterday and this morning to go over plan  Left vm    I sent plan in secure message as well.

## 2023-06-28 DIAGNOSIS — Q204 Double inlet ventricle: Secondary | ICD-10-CM

## 2023-06-28 DIAGNOSIS — Q201 Double outlet right ventricle: Secondary | ICD-10-CM

## 2023-06-28 DIAGNOSIS — Z950 Presence of cardiac pacemaker: Secondary | ICD-10-CM

## 2023-06-28 DIAGNOSIS — Z9889 Other specified postprocedural states: Secondary | ICD-10-CM

## 2023-06-28 MED ORDER — FUROSEMIDE 20 MG PO TABS
60 mg | ORAL_TABLET | Freq: Every day | ORAL | 3 refills | Status: AC
Start: 2023-06-28 — End: ?

## 2023-06-28 NOTE — Addendum Note
Addended by: Floyce Stakes on: 06/28/2023 08:24 AM     Modules accepted: Orders

## 2023-06-28 NOTE — Telephone Encounter
Called and spoke with her    Reviewed plan:  Decrease Lasix to 60 mg every day  Decrease Spironolactone to 100 mg every day  Repeat labs in about a week - let me know which lab you go to - [facility], [facility] or [facility].  Please keep taking your daily weight, blood pressure and pulse.    Is eating more protein these days with her workup    Weight: 132 lb - Is trending down    Feeling great - ''much much better''  Doing [employer]  Limiting salt    Lasix -  will resend 20 mg tabs to [facility] in [place]   Labs - [facility] - will have them drawn in [place] next week    All questions answered, verbalized understanding

## 2023-07-01 NOTE — Addendum Note
Addended by: Floyce Stakes on: 07/01/2023 08:14 AM     Modules accepted: Orders

## 2023-07-01 NOTE — Telephone Encounter
Changed labs to [facility] per patient request

## 2023-07-03 ENCOUNTER — Ambulatory Visit: Payer: BLUE CROSS/BLUE SHIELD

## 2023-07-03 ENCOUNTER — Ambulatory Visit: Payer: BLUE CROSS/BLUE SHIELD | Attending: Cardiovascular Disease

## 2023-07-03 DIAGNOSIS — Q204 Double inlet ventricle: Secondary | ICD-10-CM

## 2023-07-03 DIAGNOSIS — Q208 Other congenital malformations of cardiac chambers and connections: Secondary | ICD-10-CM

## 2023-07-03 NOTE — Progress Notes
[facility]     Date of Visit: 07/03/2023   Reason for Visit: Follow up s/p cardiac catheterization on 08/08/17, in setting of DORV s/p Fontan conversion to extracardiac baffle on 11/21/07, and subsequent placement of epicardial permanent pacemaker.     HPI: Ann Duran is a 47 y.o. 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  Underwent modified Fontan procedure (patch connection of the IVC/SVC via the right atrium to MPA) at age 74 yrs at the [facility].  Postoperative chylothorax in1982, subsequently requiring exploration by a left thoracotomy, with ligation of lymphatic vessel and thoracic duct.  Suffered fatigue and exercise intolerance in November 2008, and was found to be in atrial fibrillation.  Underwent cardioversion at [facility] by Dr. Charlesetta Garibaldi on 10/14/07, along with cardiac cath which revealed excellent RA/Fontan pressures with a mean of 12-34mmHg.  Underwent Fontan conversion on 11/21/07, involving takedown of previous Fontan and placement of an extracardiac Fontan and bidirectional Sherrine Maples shunt and placement of epicardial permanent pacing leads. Discharged on 11/29/07  Re-admitted on 12/03/07 for thoracentesis of large right pleural effusion, in the setting of bradycardia and junctional rhythm  Underwent surgical placement of abdominal pacemaker generator on 12/05/07, but atrial lead was non-capturing, left with single site ventricular pacing.  Discharged on 12/12/07, after aggressive diuresis in the setting of significant ascites and perineal edema.  Subsequent diuresis over the next 6 weeks with resolution of ascites on moderate dose lasix and moderate dose aldactone.  Underwent left hemidiaphragm plication by Dr. Larwance Sachs on 03/02/2009, unsuccessful attempt at placement of an atrial lead.  PPM rate set at VVI 90 bpm during the hospitalization, reduced to 60 bpm by Dr. Carollee Herter on 03/29/2009.  In the setting of poor chronotropic response to exercise, rate response activated by Dr. Carollee Herter in July 2011 with significantly improved exercise ability.  Underwent placement of Mirena IUD and D & C by Dr. Ardyth Man Parvatenini in February 2012. No cardiovascular complications.  Mirena IUD replaced with LEEP procedure in January 2018  Good functional capacity with MVO2 of 24.8 (88% predicted) in August 2016.  Low normal single RV function, improves with exercise.  No exercise induced arrhythmias  V-paced 99% of time at rate of 70, generator nearing ERI as of May 2018 remote check, requiring monthly checks to detect ERI.   Liver US in Sept 2017 showed no lesions but METAVIR score was 4 consistent with cirrhosis.  Underwent cardiac cath on 08/08/17 by Dr. Tobie Poet, showing mean Fontan pressures of , and liver biopsy showing extensive bridging fibrosis, but no cirrhosis.  Spirinolactone dose was doubled from 25mg  daily to 50mg  daily in an attempt to reduce Fontan volume pressure.  Underwent pacemaker generator replacement ([employer]tr[employer]) on 12/12/17 by Dr. Christeen Douglas to [place] for work in early 2020, had issues with the humidity, insomnia and had some exertional decline.   06/01/2021  cardiac catheterization & Liver Biopsy  by Dr. 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   Seen urgently in [facility] clinic in 7/24 with increased abdominal distention due to increased salt intake, diuretics increased but had evidence of AKI therefore diuretic dosing was adjusted multiple times over the next month.    Interval History:  Her abdominal bloating is significantly improved, she  is staying on a low salt diet, her current weight is ~ 132 lbs        Past Medical History: As above.  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 Darlyn Chamber here at [facility].  LEEP in Jan 2018.  Mirena IUD replaced in Jan 2018.  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  Nonsmoker, occas ETOH.    Family History: Mother and father in their 72s in good health. Siblings, she has1 brother who is in good health. There is no history of congenital heart disease in the family.    ROS: Negative and noncontributory except as noted above in HPI and Interval Events.     Physical Exam:  VS: There were no vitals taken for this visit.   General: Pleasant woman, in NAD.  Skin: No rashes. Sternotomy and left thoracotomy scars well healed.  Head and Neck: Pupils are equal and reacting.   Mouth: Normal oral mucosa. Excellent dentition  Neck:Jugular venous pressure ~12cm  Chest:  Lungs clear bilaterally   Heart: Single S1 with no systolic murmurs or rubs. No diastolic murmurs or gallops.  Abdomen: Mildly distended, no fluid wave or ascites. Soft, non-tender.  Extremities: No edema, mild varicosity on inner thighs and lower right leg.   Neurologic: Alert and oriented x 4.  Psychologic: Normal mood and affect.    Cardiac Diagnostic Data:    Echo 05/27/23:  1. Double out right ventricle with hypoplastic LV and large VSD with ''  functional single ventricle'' Right ventricular hypertrophy. Low normal  systemic RV systolic function.  2. 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  3. Large secundum ASD. Status post extracardiac Fontan. Visualized portions of  the Fontan appear patent, no obvious fenestration noted. Sherrine Maples shunt appears  normal.  4. L-transposition of the great arteries.  5. A prior echo performed on 05/25/2022 was reviewed for comparison. No  significant changes noted since the previous study.      Abdominal Ultrasound 05/25/2022  CLINICAL HISTORY: s/p Fontan, please assess for nodules, lesions and absesses.   COMPARISON: Abdominal ultrasound dated 05/23/2018.   TECHNIQUE: Real time grayscale and color Doppler images of the abdominal organs were obtained using a curved transducer.   FINDINGS:   Pancreas: Partially visualized and unremarkable.   Liver: Coarse in echotexture with irregular surface contour.   Focal liver lesions: None.   Portal vein: Normal, hepatopetal flow.   Gallbladder: Normally distended, containing gallstones. No gallbladder wall thickening. No sonographic Murphy's sign.   Bile ducts: Normal in caliber.   Kidneys: Normal in size with normal cortical thickness and echogenicity. No hydronephrosis.   Spleen: Splenomegaly.   Ascites: None in the upper abdomen.   Visualized proximal aorta and IVC: Unremarkable.   MEASUREMENTS: Liver: 14.6 cm Common Duct: 4.8 mm Right Kidney: 11.5 cm Left Kidney: 12.1 cm Spleen: 17.8 cm Aorta: 1.6 cm      IMPRESSION:    1. Coarsening of hepatic echotexture with irregularity in surface contour suggesting fibrosis/cirrhosis. No suspicious liver lesions.    2. Cholelithiasis without evidence of cholecystitis.    3. Splenomegaly suggesting portal hypertension.     Echocardiogram 05/25/2022  PHYSICIAN  INTERPRETATION: CONOTRUNCAL ANATOMY: The cardiac structural malformations are consistent with a diagnosis of levo-transposition of the great arteries. LEFT VENTRICLE: Patient demonstrated normal sinus rhythm during echocardiogram. 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 RIGHT ATRIUM: Large secundum ASD. Status post extracardiac Fontan. Visualized portions of the Fontan appear patent, no obvious fenestration noted. Sherrine Maples shunt appears widely patent with low velocity flow noted. MITRAL VALVE: Trace mitral valve regurgitation. TRICUSPID VALVE: Trace tricuspid valve regurgitation. AORTIC VALVE: No evidence of aortic valve regurgitation. AORTA: No coarctation of the aorta. PERICARDIUM: There is no evidence of pericardial effusion. CONCLUSIONS  1. 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  2. 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 Low normal systemic RV systolic function.  3. Large secundum ASD. Status post extracardiac Fontan. Visualized portions of the Fontan appear patent, no obvious fenestration noted. Sherrine Maples shunt appears normal.  4. L-transposition of the great arteries.  5. A prior echo performed on 08/09/2020 was reviewed for comparison. No significant changes noted since the previous study.    06/01/2021 Cardiac Catheterization [facility]- Dr. Bradd Canary                 Liver Biopsy 06/01/2021 [facility]- Dr. Bradd Canary       Echocardiogram 08/09/20- reviewed by me:  1. 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   2. Double out right ventricle with hypoplastic LV and large VSD with'' functional single ventricle'' Right ventricular hypertrophy. Low normal systemic RV systolic function.   3. Large secundum ASD. Status post extracardiac Fontan. Visualized portions of the Fontan appear patent, no obvious fenestration noted. Sherrine Maples shunt appears normal.   4. L-transposition of the great arteries.   5. Large secundum ASD. Status post extracardiac Fontan. Visualized portions of the Fontan appear patent, no obvious fenestration noted. Sherrine Maples shunt appears widely patent with low velocity flow noted.   6. A prior echo performed on 07/29/2019 was reviewed for comparison. No significant changes noted since the previous study.    Pacemaker remote transmission 05/07/20:  4.9 years longevity, VVIR mode, device function normal, one episode of possible NSVT (3 beats) on 6/12 after walking 5K, asymptomatic, 99% V pacing.    Echocardiogram 07/29/2019:  1. L-transposition of the great arteries.  2. 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 Low normal systemic RV systolic function.  3. Double outlet right ventricle-doubly commited ventricular septal defect-aorta anterior to pulmonary artery-no left ventricular outflow tract obstruction.  4. 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  5. Large secundum ASD. Status post extracardiac Fontan. Visualized portions of the Fontan appear patent, no obvious fenestration noted. Sherrine Maples shunt appears normal.  6. A prior echo performed on 05/13/2018 was reviewed for comparison. No significant changes noted since the previous study.    CAM  Monitor 04/12/17:  Predominant rhythm: Paced   Paced rhythm   Ventricular Ectopy = (<1%)  1802 isolated, unifocal PVCs and 208 pairs   Atrial Ectopy = (<1%)  1 isolated PAC  Predominantly V paced rhythm with rare supraventricular beats  (most are labeled PVC's). Symptoms of ''Chest discomfort'  correlated with 2 narrow complex beats/. Symptoms of  Dizziness/Lightheadedness correlated with ventricular pacing.  One of four button presses correlated with a narrow complex  beat, the remaining button presses correlated with ventricular  pacing.    Echocardiogram:  05/13/18  PHYSICIAN INTERPRETATION:  CONOTRUNCAL ANATOMY: The cardiac structural malformations are consistent with a diagnosis of levo-transposition of the great arteries.  LEFT VENTRICLE: Patient demonstrated normal sinus rhythm during echocardiogram. 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  RIGHT VENTRICLE: Double out right ventricle with hypoplastic LV and large VSD with '' functional single ventricle'' Right ventricular hypertrophy. Low normal systemic RV systolic function.  LEFT ATRIUM:  RIGHT ATRIUM: Right atrial pressure is estimated at 8 mmHg. Right atrial pressure is estimated at 8 mmHg. Large secundum ASD. Status post extracardiac Fontan. Visualized portions of the Fontan appear patent, no obvious fenestration noted. Sherrine Maples shunt   appears normal.  MITRAL VALVE: No evidence of mitral valve regurgitation.  TRICUSPID VALVE: Trace tricuspid valve regurgitation.  AORTIC VALVE: The aortic valve was not well visualized. Aortic valve area was not quantified by continuity equation on this study. Aorta is anterior of the pulmonic valve.  No evidence of aortic regurgitation.  PULMONIC VALVE: No indication of pulmonic valve regurgitation. Pulmonic is posterior of the aorta.  AORTA: Normal aortic arch, normal descending aorta, normal mid ascending aorta and the aortic root is normal in size and structure. No coarctation of the aorta.  PULMONARY ARTERY: The pulmonary artery is not well seen.  SYSTEMIC VEINS: The inferior vena cava is normal in size and exhibits less than 50% respiratory change.  PERICARDIUM: There is no evidence of pericardial effusion.  CONCLUSIONS:   1. L-transposition of the great arteries.   2. 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 Low normal systemic RV systolic function.   3. Double outlet right ventricle-doubly commited ventricular septal defect-aorta anterior to pulmonary artery-no left ventricular outflow tract obstruction.   4. 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   5. Large secundum ASD. Status post extracardiac Fontan. Visualized portions of the Fontan appear patent, no obvious fenestration noted. Sherrine Maples shunt appears normal.   6. A prior echo performed on 05/07/2017 was reviewed for comparison. No significant changes noted since the previous study.    Stress Echo/CPX:  05/13/18  BASELINE ECG:   Ventricular paced with biphasic T wave in lead V2  PROTOCOL: Bruce. (Treadmill advanced to stage 3 on its own during stage 2 of exercise after 1')  CONTROL:   HR:  72    BP: 110/64     O2 Sat:  98% (forehead) 92% (finger)   START:  1.7 MPH at 10% grade.   STOP: 11.4 METS with 3.4 mph at 14% grade x 2?37 after 7?37'' total exercise time due to shortness of breath.  Peak HR: 129      Peak BP: 128/66         Peak O2 Sat:  94%   Peak Double-Product: 16,512    Maximum VO2 = 1391 ml which is 77% of predicted, maximum VO2/kg = 23.7 ml/kg which is 85% of predicted (Note: The reference values are adjusted for weight and modality of exercise)  VE/VO2 =  40   VE/VCO2 = 36      VE/VCO2 slope = 30.1  VO2/HR =11.1 which is 111% predicted  Breathing Reserve = 41  RQ= 1.12  Anaerobic Threshold was reached at HR = 103 VO2 =  1144 ml     VO2/kg =  19.5 ml/kg  RESULTS:   Symptoms:  Shortness of breath and leg fatigue, denied chest pain  ST-T Changes: Uninterruptable in the presence of ventricular pacing.  Dysrhythmias:  Rare PVCs during exercise  BP  Response:  Blunted.  IMPRESSIONS: Fair exercise tolerance achieving 71% maximum predicted heart rate, limited by shortness of breath. ST changes during exercise are uninterpretable in the presence of ventricular pacing. No chest pain. Rare PVCs during exercise. Blunted BP at a low double product. Maximum VO2/kg = 23.7 ml/kg which is 85% of predicted. VE/VCO2 slope = 30.1 which is 119% of predicted. Compared with previous stress/CPX of 06/21/15 exercise tolerance has decreased from 12.7 METS, MVO2/kg had been 24.8, VE/VCO2 slope had been 23.1.  BASELINE:  Baseline: Single ventricle with low normal systolic function.  ADDITIONAL BASELINE FINDINGS:  LEFT VENTRICLE: Global left ventricular systolic function is mildly decreased (LVEF 40-49%).  MITRAL VALVE: Trace mitral valve regurgitation.  TRICUSPID VALVE: Trace tricuspid valve regurgitation.     Global left ventricular function increased appropriately with stress. No new segmental wall motion abnormalities were seen. Global left ventricular systolic function at peak stress is lower limits of normal (LVEF 50-55%). Mild mitral valve regurgitation.   Mild tricuspid regurgitation. Post-exercise: mild increase in single ventricle contractility.     SUMMARY:   1. Please see separately resulted cardiopulmonary exercise test for details of exercise performance.   2. DORV, L-TGA, hypoplastic LV, large VSD, large ASD.   3. Baseline: Single ventricle with low normal systolic function.   4. Post-exercise: mild increase in single ventricle contractility    Liver US:  05/13/18  IMPRESSION:  1. Irregular liver contour and coarsened in echotexture representing underlying chronic liver disease/fibrosis. No focal mass. Cholelithiasis. Splenomegaly supports portal hypertension. Normal liver Doppler.  2. Shear wave liver stiffness measurement indicating moderate to severe fibrosis (MetaVir stages F2-F3). Note, the morphologic appearance of the liver appears more fibrotic than Elastography measurements indicate.    Cardiac Cath:  08/08/17:      Liver US  07/01/17 (outside report) shows no lesions, Metavir score of 4.    Echocardiogram:  05/07/17 PHYSICIAN INTERPRETATION:  CONOTRUNCAL ANATOMY: The cardiac structural malformations are consistent with a diagnosis of levo-transposition of the great arteries.  LEFT VENTRICLE: Visually estimated left ventricular ejection fraction 55-60%. Patient demonstrated normal sinus rhythm during echocardiogram. 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  RIGHT VENTRICLE: Double out right ventricle with hypoplastic LV and large VSD with '' functional single ventricle'' Right ventricular hypertrophy. Low normal systemic RV systolic function.  RIGHT ATRIUM: Right atrial pressure is estimated at 8 mmHg. Right atrial pressure is estimated at 8 mmHg. Large secundum ASD. Status post extracardiac Fontan. Visualized portions of the Fontan appear patent, no obvious fenestration noted. Sherrine Maples shunt   appears normal.  MITRAL VALVE: No evidence of mitral valve regurgitation.  TRICUSPID VALVE: Mild tricuspid valve regurgitation.  AORTIC VALVE: The aortic valve was not well visualized. Aorta is anterior of the pulmonic valve.  No evidence of aortic regurgitation.  PULMONIC VALVE: No indication of pulmonic valve regurgitation. Pulmonic is posterior of the aorta.  AORTA: Normal aortic arch and normal descending aorta. The ascending aorta was not well visualized. Mildly enlarged aortic root (4.2 cm). No coarctation of the aorta.  PULMONARY ARTERY: The pulmonary artery is not well seen.  SYSTEMIC VEINS: The inferior vena cava is normal in size and exhibits less than 50% respiratory change.  PERICARDIUM: There is no evidence of pericardial effusion.  CONCLUSIONS:   1. L-transposition of the great arteries.   2. Double outlet right ventricle-doubly commited ventricular septal defect-aorta anterior to pulmonary artery-no left ventricular outflow tract obstruction.   3. Double out right ventricle with hypoplastic LV and large VSD  with '' functional single ventricle'' Right ventricular hypertrophy. Low normal systemic RV systolic function.   4. Large secundum ASD. Status post extracardiac Fontan. Visualized portions of the Fontan appear patent, no obvious fenestration noted. Sherrine Maples shunt appears normal.   5. Left ventricular ejection fraction is approximately 55-60%.   6. Mildly enlarged aortic root (4.2 cm).   7. Mild tricuspid valve regurgitation.   8. A prior echo performed on 03/09/2016 was reviewed for comparison. No significant changes noted since the previous study.   9. 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     ECG 11/07/16: Ventricular paced. Ventricular rate of 75 bpn, QRS duration 184 ms. QT/QTc 498/556 ms    Liver Ultrasound 07/27/16 per report from OSH: Liver 17.5 cm in length. It is normal in echodensity. Liver contour is smooth. No focal lesion os identified. Hepatopedal flow is identifies in the main, right, and left portal veins. Gallbladder and biliary tree: There are several small mobile gallstones. There is no focal tenderness. The gallbladder wall is thickened measuring 0.7 cm. There is no pericholecystic fluid. No intra or extrahepatic biliary ductal dilatation.      Echocardiogram 03/09/16: CONOTRUNCAL ANATOMY: The cardiac structural malformations are consistent with a diagnosis of levo-transposition of the great arteries. 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 The aorta is anterior to the pulmonary artery. There is no sub-aortic obstruction of left ventricular outflow tract. LEFT VENTRICLE: Normal left ventricular size. Visually estimated left ventricular ejection fraction 55-60%. Small left ventricular size. RIGHT VENTRICLE: (DTI 6.0 cm/s). 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 Low normal systemic RV systolic function. LEFT ATRIUM: Mild left atrial enlargement. RIGHT ATRIUM: Right atrial pressure is estimated at 8 mmHg. Large secundum ASD. Status post extracardiac Fontan. Visualized portions of the Fontan appear patent, no obvious fenestration noted. Sherrine Maples shunt appears normal. MITRAL VALVE: No evidence of mitral valve regurgitation. TRICUSPID VALVE: Mild tricuspid valve regurgitation. AORTIC VALVE: The aortic valve is trileaflet and structurally normal, with normal leaflet excursion. Aorta is anterior of the pulmonic valve. No evidence of aortic regurgitation. PULMONIC VALVE: No indication of pulmonic valve regurgitation. Pulmonic is posterior of the aorta. AORTA: Normal mid ascending aorta and normal aortic arch. Mildly enlarged aortic root (3.9 cm). SYSTEMIC VEINS: The inferior vena cava is normal in size and exhibits less than 50% respiratory change. PERICARDIUM: There is no evidence of pericardial effusion.    CONCLUSIONS:  1. 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 Low normal systemic RV systolic function. 2. Left ventricular ejection fraction is approximately 55-60%.  3. Double outlet right ventricle.  4. Double outlet right ventricle     -doubly commited ventricular septal defect     -aorta anterior to pulmonary artery     -no left ventricular outflow tract obstruction.  5. L-transposition of the great arteries.  6. Mild left atrial enlargement.  7. Large secundum ASD. Status post extracardiac Fontan. Visualized portions of the Fontan appear patent, no obvious fenestration noted. Sherrine Maples shunt appears normal.  8. Mild tricuspid valve regurgitation.  9. Mildly enlarged aortic root (3.9 cm).    Abdominal US 06/21/15:  The pancreas is partially visualized and grossly unremarkable. The liver is mildly heterogeneous in echogenicity. There is no focal liver lesion. Portal vein demonstrates hepatopetal flow. No intra- or extrahepatic biliary dilation.  The common bile duct is normal in caliber. The gallbladder is normally distended, containing gallstones. No gallbladder wall thickening or pericholecystic fluid. No sonographic Murphy's sign. The spleen is mildly enlarged. The kidneys are normal in size. Both kidneys demonstrate normal cortical thickness and echogenicity. No hydronephrosis. There is no ascites. The visualized proximal aorta and IVC are unremarkable. MEASUREMENTS: Liver: 15.1 cm Common Duct: 3 mm Right Kidney: 10.5 cm Left Kidney: 11.5 cm Spleen: 17 cm Aorta: 1.1 cm SHEAR WAVE LIVER STIFFNESS MEASUREMENTS: Mean 1.63 +/-0.12 m/sec, Median 1.66 m/sec, equating to 5.7-12 kPA. REFERENCE (m/s, kPa): Normal: 0.81-1.22 m/s 2.0-4.5 kPa (METAVIR F0) Normal/mild: 1.22-1.37 m/s 4.5-5.7 kPa (METAVIR F0-F1) Mild/moderate: 1.37-2 m/s 5.7-12.0 kPa (METAVIR F2-F3) Moderate/severe: 2-2.64 m/s 12.0-21.0 kPa (METAVIR F3-F4) Severe: >2.64 m/s >21.0 kPa (METAVIR F4) IMPRESSION: 1. Heterogenous liver echogenicity, suggesting diffuse liver disease. No suspicious liver lesions. Patent hepatic vasculature. 2. Shear wave liver stiffness measurement indicating mild/moderate liver fibrosis, MetaVir score of F2-3. 3. Splenomegaly. 4. Cholelithiasis without evidence of acute cholecystitis.    Echocardiogram 06/21/15:  2D AND M-MODE MEASUREMENTS (normal ranges within parentheses): Aorta/Left Atrium: Aortic Root, d (2D): 2.8 cm LV DIASTOLIC FUNCTION: MV Peak E: 1.61 m/s E/e' Ratio: 40.2 LV IVRT: 134 msec Decel Time: 229 msec Right Ventricle: TAPSE: 1.0 cm Aortic Valve: AoV Max Vel: 1.05 m/s AoV Peak PG: 4 mmHg AoV Mean PG: Tricuspid Valve and PA/RV Systolic Pressure: TR Max Velocity: 3.2 m/s RA Pressure: 8 mmHg RVSP/PASP: 49 mmHg Pulmonic Valve: PV Max Velocity: 0.8 m/s PV Max PG: 2 mmHg PV Mean PG: Aorta: Ao Asc: 2.4 cm. PHYSICIAN INTERPRETATION: CONOTRUNCAL ANATOMY: The cardiac structural malformations are consistent with a diagnosis of levo-transposition of the great arteries. 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 The aorta is anterior to the pulmonary artery. There is no sub-aortic obstruction of left ventricular outflow tract. LEFT VENTRICLE: Visually estimated left ventricular ejection fraction 55-60%. Small left ventricular size. RIGHT VENTRICLE: TAPSE 1.0 cm, (DTI 6.0 cm/s). 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 Low normal systemic RV systolic function. RIGHT ATRIUM: Right atrial pressure is estimated at 8 mmHg. Large secundum ASD. Status post extracardiac Fontan. Visualized portions of the Fontan appear patent, no obvious fenestration noted. Unable to obtain images of Glenn shunt. MITRAL VALVE: No evidence of mitral valve regurgitation. TRICUSPID VALVE: Mild tricuspid valve regurgitation. Tricuspid regurgitation velocity is not well seen. AORTIC VALVE: The aortic valve is trileaflet and structurally normal, with normal leaflet excursion. Aorta is anterior of the pulmonic valve. No evidence of aortic regurgitation. PULMONIC VALVE: No indication of pulmonic valve regurgitation. Pulmonic is posterior of the aorta. AORTA: The aortic root is normal in size and structure, normal mid ascending aorta and normal aortic arch. SYSTEMIC VEINS: The inferior vena cava is not well visualized. PERICARDIUM: There is no evidence of pericardial effusion. CONCLUSIONS: 1. Small left ventricular size 2. 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 3. L-transposition of the great arteries. 4. Mild tricuspid valve regurgitation. 5. 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 Low normal systemic RV systolic function.     CPEX/Echo 06/11/15:  BASELINE: Low normal systemic RV systolic function at rest. Hypoplastic LV. ADDITIONAL BASELINE FINDINGS: MITRAL VALVE: Trace mitral valve regurgitation. TRICUSPID VALVE: Trace  tricuspid valve regurgitation. No new segmental wall motion abnormalities were seen. Global left ventricular systolic function at peak stress is normal (LVEF 60-65%). Improved systemic RV systolic function with stress. No increase in degree of atrioventricular valve regurgitation with exercise. SUMMARY: 1. Improved systemic RV systolic function with stress. No increase in degree of atrioventricular valve regurgitation with exercise. 2. Low normal systemic RV systolic function at rest. Hypoplastic LV. BASELINE ECG: Ventricular paced rhythm PROTOCOL: Bruce. CONTROL: HR: 74 BP: 120/74 O2 Sat: 95 % START: 1.7 MPH at 10% grade. STOP: 12.7 METS with 4.2 mph at 16 % grade x 1?33'' after 10?33'' total exercise time due to shortness of breath. Peak HR: 141 Peak BP: 136/56 Peak O2 Sat: 95 % Peak Double-Product: 19,176 Maximum VO2 = 1386 ml which is 77 % of predicted, maximum VO2/kg = 24.8 ml/kg which is 88 % of predicted (Note: The reference values are adjusted for weight and modality of exercise) VE/VO2 = 35 VE/VCO2 = 31 VE/VCO2 slope = 23.1 VO2/HR = 9.8 which is 99 % predicted Breathing Reserve = 50 RQ= 1.13 Anaerobic Threshold was reached at HR = 103 VO2 = 1094 ml VO2/kg = 19.6 ml/kg RESULTS: Symptoms: Shortness of breath and leg fatigue; no chest pain or discomfort. ST-T Changes: Uninterpretable due to paced rhythm. Dysrhythmias: Rare PVC during exercise BP Response: Appropriate. IMPRESSIONS: Good exercise tolerance achieving 77 % maximum predicted heart rate, limited by shortness of breath. Exercise induced ST changes are uninterpretable due to paced rhythm. No exercise induced chest pain at a low double product. Rare PVCs as described above. Maximum VO2 = 1386 ml which is 77 % of predicted, maximum VO2/kg = 24.8 ml/kg which is 88 % of predicted . VE/VCO2 slope = 23.1 which is 92 % of predicted. Compared with previous stress/CPX of 04/24/13 maximum VO2/kg had been 25.0 at 11.9 METS    Echocardiogram:  04/21/13: CONCLUSIONS:  1. Double outlet right ventricle.  2. Moderately enlarged right ventricular size and low normal systolic function. 3. Moderately increased RV wall thickness. 4. Mild tricuspid regurgitation. 5. Status post extracardiac Fontan, appears widely patent, no fenestration noted. Sherrine Maples shunt not well visualized.    Labs:      06/24/23:   Na 130, K 5.2, creat 1.25, BUN 30, Mg 2.3,   06/17/23:  Na 130, K 5.3, creat 1.08  05/25/23:  Na 135, K 4.9, creat 1.22, INR 1.2, LFTs acceptable, AFP 2.5, [place] 19-9 is 10  05/31/22:  WBC 3.3k, Hgb 13.2, plts 107k, INR 1.1, K 4.4, creat 0.86, TB 1, TP 8.2, AFP 3.6, [place] 19-9 14, prealbumin 27  05/02/2022 WBC 2.9 Hgb 13.1 Plts 90 Na 135  K 4.3 BUN 19 Cr 0.84 Alb 4.9 AST 24 ALT 19 ALP 74      Component 05/02/22 08/25/20 08/17/20 08/18/18   Auto WBC 2.9 Low  3.0 Low  2.8 Low  5.80   RBC 4.46 4.98 4.93 5.11   Hemoglobin 13.1 14.6 14.1 15.0   Hematocrit 38.7 43.5 43.0 44.5   MCV 87 87 87 87.1   MCH 29.4 29.3 28.6 29.4   MCHC 33.9 33.6 32.8 33.7   RDW 12.3 12.1 12.1 13.8   Platelets 90 Low Panic 90 101 Low   90 Low Panic   89 Low    Hematology Comments: Note:  Note:  Note:  --     08/18/18: WBC 5.8 RBC 5.11 Hgb 15 Hct 44.5 Plt 89 NA 139 K 5.3 BUN 16 Creat 0.81 Gluc 71 BNP  149 TSH 2.39  08/08/17: Creat 0.72, BUN 12, K 3.8  07/01/17:  Chol 121, trig 58, HDL 40, LDL 69, Hgb A1c 5.4, INR 1.2, prealbumin 24, TSH 3.6, Hct 43.2, Plat 82k  07/26/16: WBC 3.0, HGB 4.69, HCT 40.7, PLT 75, NA 137, K 3.8, BUN 13, CREAT 0.7, ALK PHOS 57, AST 30, ALT 38, TBil 1.3, GGT 100, PREALB 24, BNP 113, INR 1.2, TSH 4.24, FT3 3.6, FT4 1.05.  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 Richardo Hanks, including bi-atrial Maze procedure and placement of a permanent atrial pacing lead.  Postoperative junctional rhythm and bradycardia: re-admitted 4 days after discharge with large right pericardial effusion requiring thoracentesis and placement of an abdominal pacemaker generator and epicardial ventricular lead on 12/05/07 (non-functioning atrial lead).  Ascites, improved on aldactone. No evidence of ascites on annual abdominal US.  Longstanding history of irregular and heavy menses, thus far unsuccessfully regulated on several types of hormonal therapy. Better controlled on Mirena since 2012.  Also has cervical HPV.  Now with CIN III, underwent LEEP procedure in Jan 2018  History of atrial fibrillation, but no post Fontan revision tachyarrhythmias thus far, formerly on coumadin (possible side effect of hair loss), now on ASA.  Longstanding elevated left hemidiapragm, s/p successful plication on 03/02/2009 by Dr. Larwance Sachs.  Liver US in 2014, 2016 and 2017, and 2018 shows no lesions, but 2017 and 2018 outside liver US report Metavir score of 4, which is consistent with cirrhosis.  2019 liver US at [facility] showed MetaVir stage F2-F3, although elastography impacted by congestive hepatopathy.   Cardiac cath and liver biopsy on 08/08/17, with mean Fontan pressure under sedation of , and liver biopsy showing extensive bridging fibrosis but no cirrhosis.  Spirinolactone was increased from 25 to 50mg  to 100mg  after this cath  Ventricular pacemaker, pacing 99% of time.   Generator replaced on 12/12/17 by Dr. Larwance Sachs. Deferred placement of transvenous atrial lead via her LPA to minimize V pacing, based on River's wish not to be committed to anticoagulation  Stable exercise capacity in July 2019, with MVO2 23.7 (85% predicted), previously 24.8 and 88% predicted in 2016    [place] [facility]    Labs (BMP, BNP, LFTs, GGT, AFP, INR, CBC, Prealbumin):  [x]  Within last year (date: 05/25/2023)  []  Ordered today:   []  Deferred:      Hep C screening  [x]  Prior HCV Ab negative (date: 06/21/15)  []  Not indicated (no surgery prior to early 1990s)  []  Ordered    Last hemodynamic cath:   [x]  Within last 10 years (date: 06/01/2021), see above results   []  Deferred. Reason:   []  Scheduled    Last Liver biopsy  [x]  Within last 10 years (date: 06/01/2021), see above results   []  Scheduled with cath    Liver Imaging:  []  MRI Abdomen within last 5 years (date: ), see above  []  Triple phase CT within last 5 years (date: ), see above  [x]  Liver ultrasound completed (date: 05/25/2022), see above  []  Ordered today, see above.     Advanced cardiac imaging:  []   Cardiac MRI (date: ), see above  [x]  Cardiac CT (date: 12/12/2007), see above  [x]  Deferred. Reason: Pacemaker  []  Ordered today, see above     PDE-5 Inhibitor recommended:  [x]  On sildenafil []  On tadalafil  []  Not tolerated. Previously on []  sildenafil (Year: )  []  tadalafil (Year: )   Side effect:  []  Deferred. Reason:   []  Ordered today, see above    Echo  [x]  Within last year (date: 05/25/2022)   []  Deferred. Reason:   []  Ordered today    Cardiopulmonary exercise test  []  Within last 3 years (date: 05/13/18), see above results  []  Deferred. Reason:   []  Ordered today    Advanced care planning  [x]  Date: Advanced directive in CC from 12/27/2010  []  Deferred. Reason:       Recommendations:   Check updated labs over the next few days and can adjust diuretics further as needed  Low sodium diet recommended  For now continue current diuretic regimen, may adjust depending on lab findings  Can consider SGLT2i at another visit; patient would like to defer initiation today but is open to this in future.   Ordered a CT A/P for liver surveillance previously   Continued hepatology FU with Dr Emerson Monte.   Already has an appt with me on 07/17/23, will keep that appt.

## 2023-07-17 ENCOUNTER — Inpatient Hospital Stay: Payer: BLUE CROSS/BLUE SHIELD

## 2023-07-17 ENCOUNTER — Ambulatory Visit: Payer: BLUE CROSS/BLUE SHIELD | Attending: Cardiovascular Disease

## 2023-07-17 ENCOUNTER — Ambulatory Visit: Payer: BLUE CROSS/BLUE SHIELD

## 2023-07-17 ENCOUNTER — Ambulatory Visit: Payer: BLUE CROSS/BLUE SHIELD | Attending: Pediatric Cardiology

## 2023-07-17 DIAGNOSIS — R001 Bradycardia, unspecified: Secondary | ICD-10-CM

## 2023-07-17 DIAGNOSIS — R109 Unspecified abdominal pain: Secondary | ICD-10-CM

## 2023-07-17 DIAGNOSIS — Z9889 Other specified postprocedural states: Secondary | ICD-10-CM

## 2023-07-17 DIAGNOSIS — Z950 Presence of cardiac pacemaker: Secondary | ICD-10-CM

## 2023-07-17 DIAGNOSIS — Q201 Double outlet right ventricle: Secondary | ICD-10-CM

## 2023-07-17 DIAGNOSIS — Q204 Double inlet ventricle: Secondary | ICD-10-CM

## 2023-07-17 DIAGNOSIS — Q208 Other congenital malformations of cardiac chambers and connections: Secondary | ICD-10-CM

## 2023-07-17 DIAGNOSIS — I495 Sick sinus syndrome: Secondary | ICD-10-CM

## 2023-07-17 MED ADMIN — IOHEXOL 350 MG/ML IV SOLN: 100 mL | INTRAVENOUS | @ 17:00:00 | Stop: 2023-07-17 | NDC 00407141491

## 2023-07-17 NOTE — Progress Notes
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    Subjective:   Ann Duran is a 47 y.o. female who was seen in clinic for pacemaker evaluation. He cardiac history is remarkable foir a diagnosis of L-transposition/ single ventricle. Her first cardiac surgery was a Modified RA to pA Fonatn procedure at age 56. She required a thoracic duct ligation after her Fontan and then reportedly did well until age 47 when she presented with a complaint of fatigue and was diagnosed with atrial arrhythmias. She then underwent a Fontan conversion in Jan 2009 at which time pacemaker leads were placed but the device was placed 2 weeks later after she developed bradycardia and fluid retention. The atrial lead was non functional and so she was VVI paced. She underwent attempted atrial lead revision with diaphragm plication in April of 2010, but the new atrial lead also failed to function. She was tried on VVI backup pacing but was eventually changed to VVIR at 70 bpm lower rate, which she has tolerated well since 2011, but has had increasing requirement for ventricular pacing.    Interval Events:  Ann Duran was last seen in clinic on 10/24/21. Since then, she has been doing well overall.  She denies any recent palpitations, dizziness, syncope, chest pain or shortness of breath.     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  Negative tobacco use.  Rare EtOH.    Review of Systems:  14 point review of system negative other than as stated above    Medications:  Outpatient Medications Prior to Visit   Medication Sig Dispense Refill    acetaminophen 325 mg tablet Take 2 tablets (650 mg total) by mouth.      amoxicillin 500 mg tablet       Ascorbic Acid (VITAMIN C) 1000 MG tablet Take 1 tablet (1,000 mg total) by mouth as needed for.      aspirin 81 mg EC tablet Take 1 tablet (81 mg total) by mouth daily. 90 tablet 3    Cholecalciferol (VITAMIN D) 125 MCG (5000 UT) CAPS Take by mouth.      citalopram 20 mg tablet Take 1 tablet (20 mg total) by mouth.      clonazePAM 0.5 mg tablet TAKE 1 TABLET(0.5 MG) BY MOUTH AT NIGHT AS NEEDED FOR INSOMNIA      fluticasone 50 mcg/act nasal spray   0    furosemide 20 mg tablet Take 3 tablets (  60 mg total) by mouth daily. 260 tablet 3    levonorgestrel (MIRENA, 52 MG,) 20 mcg/day IUD by Intrauterine route.      lisinopril 2.5 mg tablet Take 1 tablet (2.5 mg total) by mouth daily. 90 tablet 3    PANTOPRAZOLE 20 mg DR tablet TAKE 1 TABLET BY MOUTH DAILY AS  NEEDED 90 tablet 3    potassium chloride 10 MEQ tablet Take 1 tablet (10 mEq total) by mouth daily. 90 tablet 3    sildenafil 20 mg tablet Take 1 tablet (20 mg total) by mouth three (3) times daily. 270 tablet 3    spironolactone 50 mg tablet Take 3 tablets (150 mg total) by mouth daily. (Patient taking differently: Take 2 tablets (100 mg total) by mouth daily.) 270 tablet 3    valacyclovir 500 mg tablet ValACYclovir HCl - 500 MG Oral Tablet      zolpidem 5 mg tablet Take 0.5 tablets (2.5 mg total) by mouth at bedtime as needed for Sleep.      cyanocobalamin 1000 mcg tablet Take 1 tablet (1,000 mcg total) by mouth as needed for. (Patient not taking: Reported on 07/17/2023.)      furosemide 80 mg tablet Please take Furosemide 80mg  in the morning and 40mg  in the afternoon. (Patient not taking: Reported on 07/17/2023.) 120 tablet 3     No facility-administered medications prior to visit.     Allergies:  No Known Allergies      Objective:     Vitals:  BP 108/64 (BP Location: Right arm, Patient Position: Sitting, Cuff Size: Regular)  ~ Pulse 72  ~ Temp 36.8 ?C (98.2 ?F) (Tympanic)  ~ Resp 18  ~ Ht 1.644 m (5' 4.72'')  ~ Wt 62 kg (136 lb 11 oz)  ~ SpO2 96%  ~ BMI 22.94 kg/m?  Facility age limit for growth %iles is 20 years. Facility age limit for growth %iles is 20 years. Facility age limit for growth %iles is 20 years.    General:   alert and no distress   Skin:   warm and dry, no hyperpigmentation, vitiligo, or suspicious lesions   Nodes:   cervical, supraclavicular, and axillary nodes normal.   Eyes:   No conjunctival injection, sclerae anicteric   Oropharynx:  lips, mucosa, and tongue normal; teeth and gums normal   Neck:  no adenopathy, no carotid bruit, no JVD, supple, symmetrical, trachea midline and thyroid not enlarged, symmetric, no tenderness/mass/nodules   Lungs:   clear to auscultation bilaterally   Heart:   regular rate and rhythm, S1: single and S2: normal   Abdomen:   soft, non-tender; bowel sounds normal; no masses,  no organomegaly   Extremities:  extremities normal, atraumatic, no cyanosis or edema   Psychiatric:   normal mood, behavior, speech, dress, and thought processes     ECG today shows V pacing, and when pacing turned down, junctional rhythm at 57 bpm. No atrial activity present.    Assessment:   47 y.o. female with DORV status post Fontan conversion to extracardiac baffle on 11/21/2007, with subsequent placement of an epicardial pacemaker for sinus node dysfunction.  She had atrial lead failure shortly after initial placement.  In 2010 she underwent an unsuccessful attempt at placement of a new atrial lead.    She returns today for routine follow up. She underwent pacemaker generator replacement (Medtronic) on 12/12/17 by Dr. Larwance Sachs. She remains in VVI mode. She continues to be stable with ventricular pacing, no indicatioon for intervention  to establish atrial pacing at this time. Battery has around 1 year left. We discussed potential indications to consider upgrading to a dual chamber system and the risks and benefits of that. Potential indications would include intractable atrial arrhythmias, intractable fluid retention, or lymphatic complications. The procedure would involve transvenous mapping via a trans baffle puncture, then trans LPA puncture into the pericardial space to place then lead. It is far from clear that she has any potential atrial pacing sites, particularly in light of the absence of atrial arrhythmias.    Plan/ Recommendation:     Remote monitoring every three months and return to clinic in one year.    Vania Rea. Carollee Herter M.D.  Pediatric Cardiology

## 2023-07-17 NOTE — Procedures
SEE MEDIA SECTION FOR PROGRAMMER PRINTOUT   [facility] EP Device Clinic Pacemaker     PATIENT: Ann Duran  MRN: [id number]  DOB: 11/14/1975     DATE OF PROCEDURE: 07/17/2023  TIME OF PROCEDURE: 1130     INDICATIONS: Annual pacemaker evaluation   PROCEDURE: Pacemaker evaluation     Location: [facility]     Technician: Candie Mile, NP      READING PHYSICIAN:  Dr. Charlesetta Garibaldi     Device:  Device Manufacturer: Medtronic  Device Model Name: Jamse Mead DR MRI  Model Number: [id number]  Device Serial Number: [id number]  Device Implant Date: 12/12/2017  Indication for Implant:      RA lead:   Manufacturer: Medtronic   Model Number: [id number]  Serial Number: [id number]  Date of Implant: 12/05/2007     RV lead:  Manufacturer: Medtronic   Model Number: [id number]  Serial Number: [id number]  Date of Implant: 12/05/2007     Battery: 1.3 years     Underlying Rhythm: JR in 50's  R wave: 17.9 mV     Impedance:  A: 399 ohms  V: 570 ohms     Capture threshold  V: 2.0 V @ 0.8 ms     Since 10/24/2021     Tachy episode summary:  VT/VF: 0       VP: 99.7%     Programmed changes:   None      Settings:   Mode: VVIR  Rate: Lower/Upper Sensor: 70/150 bpm  RV Output: 3.5 V @ 0.8 ms  RV Sensitivity: 2.0 mV     Assessment  1. 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 S/p Fontan conversion to extracardiac baffle on 11/21/07.  2. Underwent surgical placement of abdominal pacemaker generator on 12/05/07, but atrial lead was non-capturing, now with single site ventricular pacing. Generator change 12/12/2017 by Dr. Larwance Sachs  3. Patient is not pacemaker dependent at this time, underlying JR in 50's bpm.  4. Medtronic single chamber pacer with normal function, 1.3 year to ERI.  5. No high rate episodes.     Plan:  1. Patient seen today by Dr. Charlesetta Garibaldi, see separate EP note.  2. Programmed changes: None  3. Remote monitoring every 3 months.  4. Return for device evaluation and follow up with Dr. Christell Constant in 1 year, sooner for any symptoms.        Candie Mile, NP

## 2023-07-17 NOTE — Progress Notes
[facility]     Date of Visit: 07/17/2023   Reason for Visit: Follow up s/p cardiac catheterization on 08/08/17, in setting of DORV s/p Fontan conversion to extracardiac baffle on 11/21/07, and subsequent placement of epicardial permanent pacemaker.     HPI: Ann Duran is a 47 y.o. 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  Underwent modified Fontan procedure (patch connection of the IVC/SVC via the right atrium to MPA) at age 28 yrs at the [facility].  Postoperative chylothorax in1982, subsequently requiring exploration by a left thoracotomy, with ligation of lymphatic vessel and thoracic duct.  Suffered fatigue and exercise intolerance in November 2008, and was found to be in atrial fibrillation.  Underwent cardioversion at [facility] by Dr. Charlesetta Garibaldi on 10/14/07, along with cardiac cath which revealed excellent RA/Fontan pressures with a mean of 12-34mmHg.  Underwent Fontan conversion on 11/21/07, involving takedown of previous Fontan and placement of an extracardiac Fontan and bidirectional Sherrine Maples shunt and placement of epicardial permanent pacing leads. Discharged on 11/29/07  Re-admitted on 12/03/07 for thoracentesis of large right pleural effusion, in the setting of bradycardia and junctional rhythm  Underwent surgical placement of abdominal pacemaker generator on 12/05/07, but atrial lead was non-capturing, left with single site ventricular pacing.  Discharged on 12/12/07, after aggressive diuresis in the setting of significant ascites and perineal edema.  Subsequent diuresis over the next 6 weeks with resolution of ascites on moderate dose lasix and moderate dose aldactone.  Underwent left hemidiaphragm plication by Dr. Larwance Sachs on 03/02/2009, unsuccessful attempt at placement of an atrial lead.  PPM rate set at VVI 90 bpm during the hospitalization, reduced to 60 bpm by Dr. Carollee Herter on 03/29/2009.  In the setting of poor chronotropic response to exercise, rate response activated by Dr. Carollee Herter in July 2011 with significantly improved exercise ability.  Underwent placement of Mirena IUD and D & C by Dr. Ardyth Man Parvatenini in February 2012. No cardiovascular complications.  Mirena IUD replaced with LEEP procedure in January 2018  Good functional capacity with MVO2 of 24.8 (88% predicted) in August 2016.  Low normal single RV function, improves with exercise.  No exercise induced arrhythmias  V-paced 99% of time at rate of 70, generator nearing ERI as of May 2018 remote check, requiring monthly checks to detect ERI.   Liver US in Sept 2017 showed no lesions but METAVIR score was 4 consistent with cirrhosis.  Underwent cardiac cath on 08/08/17 by Dr. Tobie Poet, showing mean Fontan pressures of , and liver biopsy showing extensive bridging fibrosis, but no cirrhosis.  Spirinolactone dose was doubled from 25mg  daily to 50mg  daily in an attempt to reduce Fontan volume pressure.  Underwent pacemaker generator replacement ([employer]) on 12/12/17 by Dr. Christeen Douglas to [place] for work in early 2020, had issues with the humidity, insomnia and had some exertional decline.   06/01/2021  cardiac catheterization & Liver Biopsy  by Dr. 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   Seen urgently in [facility] clinic in 7/24 with increased abdominal distention due to increased salt intake, diuretics increased but had evidence of AKI therefore diuretic dosing was adjusted multiple times over the next month.    Interval History:  She feels well overall, continues to have  some abdominal bloating but feels like it is gas and not fluid, no lower ext edema, no sig palpitations, no syncope, no presyncope.  She had an abdominal CT today and results are pending.  She lost her inspiratory muscle trainer and so we gave her another one today.  She continues to be active, she exercises regularly, denies any dyspnea with climbing multiple flights of stairs, no chest pain, no syncope, no diarrhea.        Past Medical History: As above.  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 Darlyn Chamber here at [facility].  LEEP in Jan 2018.  Mirena IUD replaced in Jan 2018.  Nose bleeds s/p bipolar cauterization 10/2016    Allergies: No Known Allergies     Medications that the patient states to be currently taking   Medication Sig    acetaminophen 325 mg tablet Take 2 tablets (650 mg total) by mouth.    amoxicillin 500 mg tablet     Ascorbic Acid (VITAMIN C) 1000 MG tablet Take 1 tablet (1,000 mg total) by mouth as needed for.    aspirin 81 mg EC tablet Take 1 tablet (81 mg total) by mouth daily.    Cholecalciferol (VITAMIN D) 125 MCG (5000 UT) CAPS Take by mouth.    citalopram 20 mg tablet Take 1 tablet (20 mg total) by mouth.    clonazePAM 0.5 mg tablet TAKE 1 TABLET(0.5 MG) BY MOUTH AT NIGHT AS NEEDED FOR INSOMNIA    fluticasone 50 mcg/act nasal spray     furosemide 20 mg tablet Take 3 tablets (60 mg total) by mouth daily.    levonorgestrel (MIRENA, 52 MG,) 20 mcg/day IUD by Intrauterine route.    lisinopril 2.5 mg tablet Take 1 tablet (2.5 mg total) by mouth daily.    PANTOPRAZOLE 20 mg DR tablet TAKE 1 TABLET BY MOUTH DAILY AS  NEEDED    potassium chloride 10 MEQ tablet Take 1 tablet (10 mEq total) by mouth daily.    sildenafil 20 mg tablet Take 1 tablet (20 mg total) by mouth three (3) times daily.    spironolactone 50 mg tablet Take 3 tablets (150 mg total) by mouth daily. (Patient taking differently: Take 2 tablets (100 mg total) by mouth daily.)    valacyclovir 500 mg tablet ValACYclovir HCl - 500 MG Oral Tablet    zolpidem 5 mg tablet Take 0.5 tablets (2.5 mg total) by mouth at bedtime as needed for Sleep.      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  Nonsmoker, occas ETOH.    Family History: Mother and father in their 69s in good health. Siblings, she has1 brother who is in good health. There is no history of congenital heart disease in the family.    ROS: Negative and noncontributory except as noted above in HPI and Interval Events.     Physical Exam:  VS: BP 104/64 (BP Location: Right arm, Patient Position: Sitting, Cuff Size: Regular)  ~ Pulse 71  ~ Temp 36.6 ?C (97.8 ?F) (Forehead)  ~ Resp 18  ~ Ht 5' 5'' (1.651 m)  ~ Wt 136 lb 12.8 oz (62.1 kg)  ~ SpO2 95%  ~ BMI 22.76 kg/m?    General: Pleasant woman, in NAD.  Skin: No rashes. Sternotomy and left thoracotomy scars well healed.  Head and Neck: Pupils are equal and reacting.   Mouth: Normal oral mucosa. Excellent dentition  Neck:Jugular venous pressure difficult to estimate.  Chest:  Lungs clear bilaterally   Heart: Single S1 with no systolic murmurs or rubs. No diastolic murmurs or gallops.  Abdomen: Mildly distended, no fluid wave or ascites. Soft, non-tender.    Extremities: No edema, mild varicosity on inner thighs and lower right leg.   Neurologic: Alert and oriented x 4.  Psychologic: Normal mood and affect.    Cardiac Diagnostic Data:    Echo 05/27/23:  1. Double out right ventricle with hypoplastic LV and large VSD with ''  functional single ventricle'' Right ventricular hypertrophy. Low normal  systemic RV systolic function.  2. 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  3. Large secundum ASD. Status post extracardiac Fontan. Visualized portions of  the Fontan appear patent, no obvious fenestration noted. Sherrine Maples shunt appears  normal.  4. L-transposition of the great arteries.  5. A prior echo performed on 05/25/2022 was reviewed for comparison. No  significant changes noted since the previous study.      Abdominal Ultrasound 05/25/2022  CLINICAL HISTORY: s/p Fontan, please assess for nodules, lesions and absesses.   COMPARISON: Abdominal ultrasound dated 05/23/2018.   TECHNIQUE: Real time grayscale and color Doppler images of the abdominal organs were obtained using a curved transducer.   FINDINGS:   Pancreas: Partially visualized and unremarkable.   Liver: Coarse in echotexture with irregular surface contour.   Focal liver lesions: None.   Portal vein: Normal, hepatopetal flow.   Gallbladder: Normally distended, containing gallstones. No gallbladder wall thickening. No sonographic Murphy's sign.   Bile ducts: Normal in caliber.   Kidneys: Normal in size with normal cortical thickness and echogenicity. No hydronephrosis.   Spleen: Splenomegaly.   Ascites: None in the upper abdomen.   Visualized proximal aorta and IVC: Unremarkable.   MEASUREMENTS: Liver: 14.6 cm Common Duct: 4.8 mm Right Kidney: 11.5 cm Left Kidney: 12.1 cm Spleen: 17.8 cm Aorta: 1.6 cm      IMPRESSION:    1. Coarsening of hepatic echotexture with irregularity in surface contour suggesting fibrosis/cirrhosis. No suspicious liver lesions.    2. Cholelithiasis without evidence of cholecystitis.    3. Splenomegaly suggesting portal hypertension.     Echocardiogram 05/25/2022  PHYSICIAN INTERPRETATION: CONOTRUNCAL ANATOMY: The cardiac structural malformations are consistent with a diagnosis of levo-transposition of the great arteries. LEFT VENTRICLE: Patient demonstrated normal sinus rhythm during echocardiogram. 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 RIGHT ATRIUM: Large secundum ASD. Status post extracardiac Fontan. Visualized portions of the Fontan appear patent, no obvious fenestration noted. Sherrine Maples shunt appears widely patent with low velocity flow noted. MITRAL VALVE: Trace mitral valve regurgitation. TRICUSPID VALVE: Trace tricuspid valve regurgitation. AORTIC VALVE: No evidence of aortic valve regurgitation. AORTA: No coarctation of the aorta. PERICARDIUM: There is no evidence of pericardial effusion. CONCLUSIONS  1. 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  2. 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 Low normal systemic RV systolic function.  3. Large secundum ASD. Status post extracardiac Fontan. Visualized portions of the Fontan appear patent, no obvious fenestration noted. Sherrine Maples shunt appears normal.  4. L-transposition of the great arteries.  5. A prior echo performed on 08/09/2020 was reviewed for comparison. No significant changes noted since the previous study.    06/01/2021 Cardiac Catheterization [facility]- Dr. Bradd Canary                 Liver Biopsy 06/01/2021 [facility]- Dr. Bradd Canary       Echocardiogram 08/09/20- reviewed by me:  1. 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   2. Double out right ventricle with hypoplastic LV and large VSD with'' functional single ventricle'' Right ventricular hypertrophy. Low normal systemic RV systolic function.   3. Large secundum ASD. Status post extracardiac Fontan. Visualized portions of the Fontan appear patent, no obvious fenestration noted. Sherrine Maples shunt appears normal.   4. L-transposition of the great arteries.   5. Large secundum ASD. Status post extracardiac Fontan. Visualized portions of the Fontan appear patent, no obvious fenestration noted. Sherrine Maples shunt appears widely patent with low velocity flow noted.   6. A prior echo performed on 07/29/2019 was reviewed for comparison. No significant changes noted since the previous study.    Pacemaker remote transmission 05/07/20:  4.9 years longevity, VVIR mode, device function normal, one episode of possible NSVT (3 beats) on 6/12 after walking 5K, asymptomatic, 99% V pacing.    Echocardiogram 07/29/2019:  1. L-transposition of the great arteries.  2. 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 Low normal systemic RV systolic function.  3. Double outlet right ventricle-doubly commited ventricular septal defect-aorta anterior to pulmonary artery-no left ventricular outflow tract obstruction.  4. 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  5. Large secundum ASD. Status post extracardiac Fontan. Visualized portions of the Fontan appear patent, no obvious fenestration noted. Sherrine Maples shunt appears normal.  6. A prior echo performed on 05/13/2018 was reviewed for comparison. No significant changes noted since the previous study.    CAM  Monitor 04/12/17:  Predominant rhythm: Paced   Paced rhythm   Ventricular Ectopy = (<1%)  1802 isolated, unifocal PVCs and 208 pairs   Atrial Ectopy = (<1%)  1 isolated PAC  Predominantly V paced rhythm with rare supraventricular beats  (most are labeled PVC's). Symptoms of ''Chest discomfort'  correlated with 2 narrow complex beats/. Symptoms of  Dizziness/Lightheadedness correlated with ventricular pacing.  One of four button presses correlated with a narrow complex  beat, the remaining button presses correlated with ventricular  pacing.    Echocardiogram:  05/13/18  PHYSICIAN INTERPRETATION:  CONOTRUNCAL ANATOMY: The cardiac structural malformations are consistent with a diagnosis of levo-transposition of the great arteries.  LEFT VENTRICLE: Patient demonstrated normal sinus rhythm during echocardiogram. 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  RIGHT VENTRICLE: Double out right ventricle with hypoplastic LV and large VSD with '' functional single ventricle'' Right ventricular hypertrophy. Low normal systemic RV systolic function.  LEFT ATRIUM:  RIGHT ATRIUM: Right atrial pressure is estimated at 8 mmHg. Right atrial pressure is estimated at 8 mmHg. Large secundum ASD. Status post extracardiac Fontan. Visualized portions of the Fontan appear patent, no obvious fenestration noted. Sherrine Maples shunt   appears normal.  MITRAL VALVE: No evidence of mitral valve regurgitation.  TRICUSPID VALVE: Trace tricuspid valve regurgitation.  AORTIC VALVE: The aortic valve was not well visualized. Aortic valve area was not quantified by continuity equation on this study. Aorta is anterior of the pulmonic valve.  No evidence of aortic regurgitation.  PULMONIC VALVE: No indication of pulmonic valve regurgitation. Pulmonic is posterior of the aorta.  AORTA: Normal aortic arch, normal descending aorta, normal mid ascending aorta and the aortic root is normal in size and structure. No coarctation of the aorta.  PULMONARY ARTERY: The pulmonary artery is not well seen.  SYSTEMIC VEINS: The inferior vena cava is normal  in size and exhibits less than 50% respiratory change.  PERICARDIUM: There is no evidence of pericardial effusion.  CONCLUSIONS:   1. L-transposition of the great arteries.   2. 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 Low normal systemic RV systolic function.   3. Double outlet right ventricle-doubly commited ventricular septal defect-aorta anterior to pulmonary artery-no left ventricular outflow tract obstruction.   4. 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   5. Large secundum ASD. Status post extracardiac Fontan. Visualized portions of the Fontan appear patent, no obvious fenestration noted. Sherrine Maples shunt appears normal.   6. A prior echo performed on 05/07/2017 was reviewed for comparison. No significant changes noted since the previous study.    Stress Echo/CPX:  05/13/18  BASELINE ECG:   Ventricular paced with biphasic T wave in lead V2  PROTOCOL: Bruce. (Treadmill advanced to stage 3 on its own during stage 2 of exercise after 1')  CONTROL:   HR:  72    BP: 110/64     O2 Sat:  98% (forehead) 92% (finger)   START:  1.7 MPH at 10% grade.   STOP: 11.4 METS with 3.4 mph at 14% grade x 2?37 after 7?37'' total exercise time due to shortness of breath.  Peak HR: 129      Peak BP: 128/66         Peak O2 Sat:  94%   Peak Double-Product: 16,512    Maximum VO2 = 1391 ml which is 77% of predicted, maximum VO2/kg = 23.7 ml/kg which is 85% of predicted (Note: The reference values are adjusted for weight and modality of exercise)  VE/VO2 =  40   VE/VCO2 = 36      VE/VCO2 slope = 30.1  VO2/HR =11.1 which is 111% predicted  Breathing Reserve = 41  RQ= 1.12  Anaerobic Threshold was reached at HR = 103 VO2 =  1144 ml     VO2/kg =  19.5 ml/kg  RESULTS:   Symptoms:  Shortness of breath and leg fatigue, denied chest pain  ST-T Changes: Uninterruptable in the presence of ventricular pacing.  Dysrhythmias:  Rare PVCs during exercise  BP Response:  Blunted.  IMPRESSIONS: Fair exercise tolerance achieving 71% maximum predicted heart rate, limited by shortness of breath. ST changes during exercise are uninterpretable in the presence of ventricular pacing. No chest pain. Rare PVCs during exercise. Blunted BP at a low double product. Maximum VO2/kg = 23.7 ml/kg which is 85% of predicted. VE/VCO2 slope = 30.1 which is 119% of predicted. Compared with previous stress/CPX of 06/21/15 exercise tolerance has decreased from 12.7 METS, MVO2/kg had been 24.8, VE/VCO2 slope had been 23.1.  BASELINE:  Baseline: Single ventricle with low normal systolic function.  ADDITIONAL BASELINE FINDINGS:  LEFT VENTRICLE: Global left ventricular systolic function is mildly decreased (LVEF 40-49%).  MITRAL VALVE: Trace mitral valve regurgitation.  TRICUSPID VALVE: Trace tricuspid valve regurgitation.     Global left ventricular function increased appropriately with stress. No new segmental wall motion abnormalities were seen. Global left ventricular systolic function at peak stress is lower limits of normal (LVEF 50-55%). Mild mitral valve regurgitation.   Mild tricuspid regurgitation. Post-exercise: mild increase in single ventricle contractility.     SUMMARY:   1. Please see separately resulted cardiopulmonary exercise test for details of exercise performance.   2. DORV, L-TGA, hypoplastic LV, large VSD, large ASD.   3. Baseline: Single ventricle with low normal  systolic function.   4. Post-exercise: mild increase in single ventricle contractility    Liver US:  05/13/18  IMPRESSION:  1. Irregular liver contour and coarsened in echotexture representing underlying chronic liver disease/fibrosis. No focal mass. Cholelithiasis. Splenomegaly supports portal hypertension. Normal liver Doppler.  2. Shear wave liver stiffness measurement indicating moderate to severe fibrosis (MetaVir stages F2-F3). Note, the morphologic appearance of the liver appears more fibrotic than Elastography measurements indicate.    Cardiac Cath:  08/08/17:      Liver US  07/01/17 (outside report) shows no lesions, Metavir score of 4.    Echocardiogram:  05/07/17 PHYSICIAN INTERPRETATION:  CONOTRUNCAL ANATOMY: The cardiac structural malformations are consistent with a diagnosis of levo-transposition of the great arteries.  LEFT VENTRICLE: Visually estimated left ventricular ejection fraction 55-60%. Patient demonstrated normal sinus rhythm during echocardiogram. 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  RIGHT VENTRICLE: Double out right ventricle with hypoplastic LV and large VSD with '' functional single ventricle'' Right ventricular hypertrophy. Low normal systemic RV systolic function.  RIGHT ATRIUM: Right atrial pressure is estimated at 8 mmHg. Right atrial pressure is estimated at 8 mmHg. Large secundum ASD. Status post extracardiac Fontan. Visualized portions of the Fontan appear patent, no obvious fenestration noted. Sherrine Maples shunt   appears normal.  MITRAL VALVE: No evidence of mitral valve regurgitation.  TRICUSPID VALVE: Mild tricuspid valve regurgitation.  AORTIC VALVE: The aortic valve was not well visualized. Aorta is anterior of the pulmonic valve.  No evidence of aortic regurgitation.  PULMONIC VALVE: No indication of pulmonic valve regurgitation. Pulmonic is posterior of the aorta.  AORTA: Normal aortic arch and normal descending aorta. The ascending aorta was not well visualized. Mildly enlarged aortic root (4.2 cm). No coarctation of the aorta.  PULMONARY ARTERY: The pulmonary artery is not well seen.  SYSTEMIC VEINS: The inferior vena cava is normal in size and exhibits less than 50% respiratory change.  PERICARDIUM: There is no evidence of pericardial effusion.  CONCLUSIONS:   1. L-transposition of the great arteries.   2. Double outlet right ventricle-doubly commited ventricular septal defect-aorta anterior to pulmonary artery-no left ventricular outflow tract obstruction.   3. 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 Low normal systemic RV systolic function.   4. Large secundum ASD. Status post extracardiac Fontan. Visualized portions of the Fontan appear patent, no obvious fenestration noted. Sherrine Maples shunt appears normal.   5. Left ventricular ejection fraction is approximately 55-60%.   6. Mildly enlarged aortic root (4.2 cm).   7. Mild tricuspid valve regurgitation.   8. A prior echo performed on 03/09/2016 was reviewed for comparison. No significant changes noted since the previous study.   9. 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     ECG 11/07/16: Ventricular paced. Ventricular rate of 75 bpn, QRS duration 184 ms. QT/QTc 498/556 ms    Liver Ultrasound 07/27/16 per report from OSH: Liver 17.5 cm in length. It is normal in echodensity. Liver contour is smooth. No focal lesion os identified. Hepatopedal flow is identifies in the main, right, and left portal veins. Gallbladder and biliary tree: There are several small mobile gallstones. There is no focal tenderness. The gallbladder wall is thickened measuring 0.7 cm. There is no pericholecystic fluid. No intra or extrahepatic biliary ductal dilatation.      Echocardiogram 03/09/16: CONOTRUNCAL ANATOMY: The cardiac structural malformations are consistent with a diagnosis of levo-transposition of the great arteries. The observed structural  malformations are consistent with the diagnosis of double outlet right ventricle with doubly commited ventricular septal defect. The aorta is anterior to the pulmonary artery. There is no sub-aortic obstruction of left ventricular outflow tract. LEFT VENTRICLE: Normal left ventricular size. Visually estimated left ventricular ejection fraction 55-60%. Small left ventricular size. RIGHT VENTRICLE: (DTI 6.0 cm/s). 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 Low normal systemic RV systolic function. LEFT ATRIUM: Mild left atrial enlargement. RIGHT ATRIUM: Right atrial pressure is estimated at 8 mmHg. Large secundum ASD. Status post extracardiac Fontan. Visualized portions of the Fontan appear patent, no obvious fenestration noted. Sherrine Maples shunt appears normal. MITRAL VALVE: No evidence of mitral valve regurgitation. TRICUSPID VALVE: Mild tricuspid valve regurgitation. AORTIC VALVE: The aortic valve is trileaflet and structurally normal, with normal leaflet excursion. Aorta is anterior of the pulmonic valve. No evidence of aortic regurgitation. PULMONIC VALVE: No indication of pulmonic valve regurgitation. Pulmonic is posterior of the aorta. AORTA: Normal mid ascending aorta and normal aortic arch. Mildly enlarged aortic root (3.9 cm). SYSTEMIC VEINS: The inferior vena cava is normal in size and exhibits less than 50% respiratory change. PERICARDIUM: There is no evidence of pericardial effusion.    CONCLUSIONS:  1. 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 Low normal systemic RV systolic function.  2. Left ventricular ejection fraction is approximately 55-60%.  3. Double outlet right ventricle.  4. Double outlet right ventricle     -doubly commited ventricular septal defect     -aorta anterior to pulmonary artery     -no left ventricular outflow tract obstruction.  5. L-transposition of the great arteries.  6. Mild left atrial enlargement.  7. Large secundum ASD. Status post extracardiac Fontan. Visualized portions of the Fontan appear patent, no obvious fenestration noted. Sherrine Maples shunt appears normal.  8. Mild tricuspid valve regurgitation.  9. Mildly enlarged aortic root (3.9 cm).    Abdominal US 06/21/15:  The pancreas is partially visualized and grossly unremarkable. The liver is mildly heterogeneous in echogenicity. There is no focal liver lesion. Portal vein demonstrates hepatopetal flow. No intra- or extrahepatic biliary dilation. The common bile duct is normal in caliber. The gallbladder is normally distended, containing gallstones. No gallbladder wall thickening or pericholecystic fluid. No sonographic Murphy's sign. The spleen is mildly enlarged. The kidneys are normal in size. Both kidneys demonstrate normal cortical thickness and echogenicity. No hydronephrosis. There is no ascites. The visualized proximal aorta and IVC are unremarkable. MEASUREMENTS: Liver: 15.1 cm Common Duct: 3 mm Right Kidney: 10.5 cm Left Kidney: 11.5 cm Spleen: 17 cm Aorta: 1.1 cm SHEAR WAVE LIVER STIFFNESS MEASUREMENTS: Mean 1.63 +/-0.12 m/sec, Median 1.66 m/sec, equating to 5.7-12 kPA. REFERENCE (m/s, kPa): Normal: 0.81-1.22 m/s 2.0-4.5 kPa (METAVIR F0) Normal/mild: 1.22-1.37 m/s 4.5-5.7 kPa (METAVIR F0-F1) Mild/moderate: 1.37-2 m/s 5.7-12.0 kPa (METAVIR F2-F3) Moderate/severe: 2-2.64 m/s 12.0-21.0 kPa (METAVIR F3-F4) Severe: >2.64 m/s >21.0 kPa (METAVIR F4) IMPRESSION: 1. Heterogenous liver echogenicity, suggesting diffuse liver disease. No suspicious liver lesions. Patent hepatic vasculature. 2. Shear wave liver stiffness measurement indicating mild/moderate liver fibrosis, MetaVir score of F2-3. 3. Splenomegaly. 4. Cholelithiasis without evidence of acute cholecystitis.    Echocardiogram 06/21/15:  2D AND M-MODE MEASUREMENTS (normal ranges within parentheses): Aorta/Left Atrium: Aortic Root, d (2D): 2.8 cm LV DIASTOLIC FUNCTION: MV Peak E: 1.61 m/s E/e' Ratio: 40.2 LV IVRT: 134 msec Decel Time: 229 msec Right Ventricle: TAPSE:  1.0 cm Aortic Valve: AoV Max Vel: 1.05 m/s AoV Peak PG: 4 mmHg AoV Mean PG: Tricuspid Valve and PA/RV Systolic Pressure: TR Max Velocity: 3.2 m/s RA Pressure: 8 mmHg RVSP/PASP: 49 mmHg Pulmonic Valve: PV Max Velocity: 0.8 m/s PV Max PG: 2 mmHg PV Mean PG: Aorta: Ao Asc: 2.4 cm. PHYSICIAN INTERPRETATION: CONOTRUNCAL ANATOMY: The cardiac structural malformations are consistent with a diagnosis of levo-transposition of the great arteries. 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 The aorta is anterior to the pulmonary artery. There is no sub-aortic obstruction of left ventricular outflow tract. LEFT VENTRICLE: Visually estimated left ventricular ejection fraction 55-60%. Small left ventricular size. RIGHT VENTRICLE: TAPSE 1.0 cm, (DTI 6.0 cm/s). 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 Low normal systemic RV systolic function. RIGHT ATRIUM: Right atrial pressure is estimated at 8 mmHg. Large secundum ASD. Status post extracardiac Fontan. Visualized portions of the Fontan appear patent, no obvious fenestration noted. Unable to obtain images of Glenn shunt. MITRAL VALVE: No evidence of mitral valve regurgitation. TRICUSPID VALVE: Mild tricuspid valve regurgitation. Tricuspid regurgitation velocity is not well seen. AORTIC VALVE: The aortic valve is trileaflet and structurally normal, with normal leaflet excursion. Aorta is anterior of the pulmonic valve. No evidence of aortic regurgitation. PULMONIC VALVE: No indication of pulmonic valve regurgitation. Pulmonic is posterior of the aorta. AORTA: The aortic root is normal in size and structure, normal mid ascending aorta and normal aortic arch. SYSTEMIC VEINS: The inferior vena cava is not well visualized. PERICARDIUM: There is no evidence of pericardial effusion. CONCLUSIONS: 1. Small left ventricular size 2. 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 3. L-transposition of the great arteries. 4. Mild tricuspid valve regurgitation. 5. 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 Low normal systemic RV systolic function.     CPEX/Echo 06/11/15:  BASELINE: Low normal systemic RV systolic function at rest. Hypoplastic LV. ADDITIONAL BASELINE FINDINGS: MITRAL VALVE: Trace mitral valve regurgitation. TRICUSPID VALVE: Trace tricuspid valve regurgitation. No new segmental wall motion abnormalities were seen. Global left ventricular systolic function at peak stress is normal (LVEF 60-65%). Improved systemic RV systolic function with stress. No increase in degree of atrioventricular valve regurgitation with exercise. SUMMARY: 1. Improved systemic RV systolic function with stress. No increase in degree of atrioventricular valve regurgitation with exercise. 2. Low normal systemic RV systolic function at rest. Hypoplastic LV. BASELINE ECG: Ventricular paced rhythm PROTOCOL: Bruce. CONTROL: HR: 74 BP: 120/74 O2 Sat: 95 % START: 1.7 MPH at 10% grade. STOP: 12.7 METS with 4.2 mph at 16 % grade x 1?33'' after 10?33'' total exercise time due to shortness of breath. Peak HR: 141 Peak BP: 136/56 Peak O2 Sat: 95 % Peak Double-Product: 19,176 Maximum VO2 = 1386 ml which is 77 % of predicted, maximum VO2/kg = 24.8 ml/kg which is 88 % of predicted (Note: The reference values are adjusted for weight and modality of exercise) VE/VO2 = 35 VE/VCO2 = 31 VE/VCO2 slope = 23.1 VO2/HR = 9.8 which is 99 % predicted Breathing Reserve = 50 RQ= 1.13 Anaerobic Threshold was reached at HR = 103 VO2 = 1094 ml VO2/kg = 19.6 ml/kg RESULTS: Symptoms: Shortness of breath and leg fatigue; no chest pain or discomfort. ST-T Changes: Uninterpretable  due to paced rhythm. Dysrhythmias: Rare PVC during exercise BP Response: Appropriate. IMPRESSIONS: Good exercise tolerance achieving 77 % maximum predicted heart rate, limited by shortness of breath. Exercise induced ST changes are uninterpretable due to paced rhythm. No exercise induced chest pain at a low double product. Rare PVCs as described above. Maximum VO2 = 1386 ml which is 77 % of predicted, maximum VO2/kg = 24.8 ml/kg which is 88 % of predicted . VE/VCO2 slope = 23.1 which is 92 % of predicted. Compared with previous stress/CPX of 04/24/13 maximum VO2/kg had been 25.0 at 11.9 METS    Echocardiogram:  04/21/13: CONCLUSIONS:  1. Double outlet right ventricle.  2. Moderately enlarged right ventricular size and low normal systolic function. 3. Moderately increased RV wall thickness. 4. Mild tricuspid regurgitation. 5. Status post extracardiac Fontan, appears widely patent, no fenestration noted. Sherrine Maples shunt not well visualized.    Labs:      07/04/23:  Na 131, K 5.2, creat 1.2, BUN 24, Castro 9.5, BNP 48  06/24/23:   Na 130, K 5.2, creat 1.25, BUN 30, Mg 2.3,   06/17/23:  Na 130, K 5.3, creat 1.08  05/25/23:  Na 135, K 4.9, creat 1.22, INR 1.2, LFTs acceptable, AFP 2.5, Stickney 19-9 is 10  05/31/22:  WBC 3.3k, Hgb 13.2, plts 107k, INR 1.1, K 4.4, creat 0.86, TB 1, TP 8.2, AFP 3.6, Elberta 19-9 14, prealbumin 27  05/02/2022 WBC 2.9 Hgb 13.1 Plts 90 Na 135  K 4.3 BUN 19 Cr 0.84 Alb 4.9 AST 24 ALT 19 ALP 74      Component 05/02/22 08/25/20 08/17/20 08/18/18   Auto WBC 2.9 Low  3.0 Low  2.8 Low  5.80   RBC 4.46 4.98 4.93 5.11   Hemoglobin 13.1 14.6 14.1 15.0   Hematocrit 38.7 43.5 43.0 44.5   MCV 87 87 87 87.1   MCH 29.4 29.3 28.6 29.4 MCHC 33.9 33.6 32.8 33.7   RDW 12.3 12.1 12.1 13.8   Platelets 90 Low Panic 90 101 Low   90 Low Panic   89 Low    Hematology Comments: Note:  Note:  Note:  --     08/18/18: WBC 5.8 RBC 5.11 Hgb 15 Hct 44.5 Plt 89 NA 139 K 5.3 BUN 16 Creat 0.81 Gluc 71 BNP 149 TSH 2.39  08/08/17: Creat 0.72, BUN 12, K 3.8  07/01/17:  Chol 121, trig 58, HDL 40, LDL 69, Hgb A1c 5.4, INR 1.2, prealbumin 24, TSH 3.6, Hct 43.2, Plat 82k  07/26/16: WBC 3.0, HGB 4.69, HCT 40.7, PLT 75, NA 137, K 3.8, BUN 13, CREAT 0.7, ALK PHOS 57, AST 30, ALT 38, TBil 1.3, GGT 100, PREALB 24, BNP 113, INR 1.2, TSH 4.24, FT3 3.6, FT4 1.05.  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 Richardo Hanks, including bi-atrial Maze procedure and placement of a permanent atrial pacing lead.  Postoperative junctional rhythm and bradycardia: re-admitted 4 days after discharge with large right pericardial effusion requiring thoracentesis and placement of an abdominal pacemaker generator and epicardial ventricular lead on 12/05/07 (non-functioning atrial lead).  Ascites, improved on aldactone. No evidence  of ascites on annual abdominal US.  Longstanding history of irregular and heavy menses, thus far unsuccessfully regulated on several types of hormonal therapy. Better controlled on Mirena since 2012.  Also has cervical HPV.  Now with CIN III, underwent LEEP procedure in Jan 2018  History of atrial fibrillation, but no post Fontan revision tachyarrhythmias thus far, formerly on coumadin (possible side effect of hair loss), now on ASA.  Longstanding elevated left hemidiapragm, s/p successful plication on 03/02/2009 by Dr. Larwance Sachs.  Liver US in 2014, 2016 and 2017, and 2018 shows no lesions, but 2017 and 2018 outside liver US report Metavir score of 4, which is consistent with cirrhosis.  2019 liver US at [facility] showed MetaVir stage F2-F3, although elastography impacted by congestive hepatopathy.   Cardiac cath and liver biopsy on 08/08/17, with mean Fontan pressure under sedation of , and liver biopsy showing extensive bridging fibrosis but no cirrhosis.  Spirinolactone was increased from 25 to 50mg  to 100mg  after this cath  Ventricular pacemaker, pacing 99% of time.   Generator replaced on 12/12/17 by Dr. Larwance Sachs. Deferred placement of transvenous atrial lead via her LPA to minimize V pacing, based on Brittannie's wish not to be committed to anticoagulation  Stable exercise capacity in July 2019, with MVO2 23.7 (85% predicted), previously 24.8 and 88% predicted in 2016    [facility]    Labs (BMP, BNP, LFTs, GGT, AFP, INR, CBC, Prealbumin):  [x]  Within last year (date: 05/25/2023)  []  Ordered today:   []  Deferred:      Hep C screening  [x]  Prior HCV Ab negative (date: 06/21/15)  []  Not indicated (no surgery prior to early 1990s)  []  Ordered    Last hemodynamic cath:   [x]  Within last 10 years (date: 06/01/2021), see above results   []  Deferred. Reason:   []  Scheduled    Last Liver biopsy  [x]  Within last 10 years (date: 06/01/2021), see above results   []  Scheduled with cath    Liver Imaging:  []  MRI Abdomen within last 5 years (date: ), see above  []  Triple phase CT within last 5 years (date: ), see above  [x]  Liver ultrasound completed (date: 05/25/2022), see above  []  Ordered today, see above.     Advanced cardiac imaging:  []  Cardiac MRI (date: ), see above  [x]  Cardiac CT (date: 12/12/2007), see above  [x]  Deferred. Reason: Pacemaker  []  Ordered today, see above     PDE-5 Inhibitor recommended:  [x]  On sildenafil []  On tadalafil  []  Not tolerated. Previously on []  sildenafil (Year: )  []  tadalafil (Year: )   Side effect:  []  Deferred. Reason:   []  Ordered today, see above    Echo  [x]  Within last year (date: 05/25/2022)   []  Deferred. Reason:   []  Ordered today    Cardiopulmonary exercise test  [x]  Within last 3 years (date: 05/13/18), see above results  []  Deferred. Reason:   []  Ordered today    Advanced care planning  [x]  Date: Advanced directive in CC from 12/27/2010  []  Deferred. Reason:       Recommendations:   Continue current meds which she is tolerating well  Low sodium diet recommended  For now continue current diuretic regimen, if she feels that she is getting volume overloaded then okay to increase lasix to 80 mg daily  Stop additional KCL given borderline elevated K level  Can consider SGLT2i at another visit.   FU on CT results  Liver US and full Fontan labs in 6 months  Continued hepatology FU with Dr Emerson Monte.   Gave her an inspiratory muscle trainer today  Okay to increase ASA to 162 mg daily based on Dr Bethann Berkshire recommendation, take with meals  Refer to GI given abdominal discomfort due to excess gas  Follow up in 6 months with repeat echo, liver US and Fontan labs    55 minutes were spent personally by me today on this encounter which include today's pre-visit review of the chart, obtaining appropriate history, performing an evaluation, documentation and discussion of management with details supported within the note for today's visit. The time documented was exclusive of any time spent on the separately billed procedure.        Pediatric Cardiology Fellow

## 2023-08-06 ENCOUNTER — Telehealth: Payer: BLUE CROSS/BLUE SHIELD

## 2023-08-06 NOTE — Telephone Encounter
The pt was called and a voicemail was left to assess for symptoms related to a identified RV non capture on the scheduled transmission 08/05/2023. The pt was asked to call back the [facility] office as soon as possible to also assess if the pt has established care with an EP in [place] and for a possible Holter monitor request. The call back number provided [phone].

## 2023-08-07 ENCOUNTER — Telehealth: Payer: BLUE CROSS/BLUE SHIELD

## 2023-08-07 ENCOUNTER — Inpatient Hospital Stay: Payer: BLUE CROSS/BLUE SHIELD

## 2023-08-07 ENCOUNTER — Ambulatory Visit: Payer: BLUE CROSS/BLUE SHIELD

## 2023-08-07 DIAGNOSIS — Z950 Presence of cardiac pacemaker: Secondary | ICD-10-CM

## 2023-08-07 DIAGNOSIS — R001 Bradycardia, unspecified: Secondary | ICD-10-CM

## 2023-08-07 NOTE — Procedures
SEE MEDIA SECTION FOR DOWNLOAD PRINTOUT     Ann Duran is a 47 y.o. female  [id number]  Reading MD:  Dr Carollee Herter    A Medtronic Carelink remote monitor was received as a routine download. Device function unchanged with known elevated RV thresholds. 1 beat of potential RV loss of capture identified on the presenting rhythm. No high rate episodes noted. A voicemail was left to assess whether the pt plans to stay here at [facility] or will establish primary EP care closer to home. The pt was asked to call the [facility] office at her earliest convenience.  The pt called back and stated she has been feeling dizzy on and off x 2 days 9/30 and 10/1. The pt was informed we did see a single RV loss of capture on the 08/02/2023 remote transmission and due to potential symptomatic episodes we recommend following up with a local EP for device evaluation and if symptoms worsen to report to a local ER. The pt stated she has not established care with a local EP but will attempt to obtain care today. The pt was able to be seen by a local EP and they were able to obtain thresholds of 2V @ 1ms and 1.75 V @ 1.2 ms with no loss of capture noted. Due to the battery life of 13 months until ERI the pt was programmed at 3.5V @ 1.2 ms (monitor) and 2 day Holter monitor will be mailed to assess the device function. [facility] scheduling will also be notified to schdule the pt in November or earliest avaialble for an in office device evaluation. Continue to monitor. See Media for full report.

## 2023-08-07 NOTE — Telephone Encounter
I CALLED PATIENT AND NO ANSWER SO I LEFT A MESSAGE TO ACCOMMODATE AN APPT WITH DR Carollee Herter HERE AT [facility] FOR [employer]

## 2023-08-07 NOTE — Telephone Encounter
The pt called back and stated she has been feeling dizzy on and off x 2 days 9/30 and 10/1. The pt was informed we did see a single RV loss of capture on the 08/02/2023 remote transmission and due to potential symptomatic episodes we recommend following up with a local EP for device evaluation and if symptoms worsen to report to a local ER. The pt stated she has not establish care with a local EP but will attempt to obtain care today. The pt stated she leaves for [facility] on 08/08/2023 for work and will be in [place] from 10/3-10/9. She was informed that we have scheduled her for a in office device evaluation and CXR on 08/09/2023 @1400  if she is not able to follow up with a local EP. She stated she will keep [place] updated if she is able to be seen today by a local EP and if not she will present to the [facility] office on 08/09/2023

## 2023-08-08 ENCOUNTER — Inpatient Hospital Stay: Payer: BLUE CROSS/BLUE SHIELD

## 2023-08-08 DIAGNOSIS — R001 Bradycardia, unspecified: Secondary | ICD-10-CM

## 2023-08-08 DIAGNOSIS — Z950 Presence of cardiac pacemaker: Secondary | ICD-10-CM

## 2023-08-08 NOTE — Progress Notes
Ann Duran is a 47 y.o. female  [phone]  Reading MD:  Dr Carollee Herter  A 48 hours [employer] Cardiac Holter Monitor was Mailed to the Patient on  08/08/23 at 0125.  Instruction provided to Patient about application, monitoring duration, care of the monitor, return instruction mail back to company. All questions answered and patient can call for any question.

## 2023-08-09 ENCOUNTER — Ambulatory Visit: Payer: BLUE CROSS/BLUE SHIELD

## 2023-08-09 ENCOUNTER — Ambulatory Visit: Payer: BLUE CROSS/BLUE SHIELD | Attending: Pediatric Cardiology

## 2023-08-19 ENCOUNTER — Telehealth: Payer: BLUE CROSS/BLUE SHIELD

## 2023-08-19 NOTE — Telephone Encounter
The pt called, and was inquiring if the adjustment to her pacemaker made on 08/07/2023, which a local EP increased her programmed RV outputs due to a loss of capture which was identified on the presenting rhythm,  would make it more difficult for her to complete her workouts? She stated she has been ill for the last 9 days and found it difficult to complete her workout on 08/18/2023 but did not have any issued while walking up and down stairs on the same day as she was working a stadium event for work. She stated she mailed the Holter monitor back the day before and was not wearing it at the time of her workout. She denied any other symptoms any brady symptoms and was told he will await the results of her Holter monitor and if symptoms persist she was asked to re-initiate a remote transmission. She was also explained that due to her ongoing illness, strenuous activity may be difficult at this time. We will see her at her scheduled in office device evaluation on 09/18/2023 with Dr Carollee Herter. Continue to monitor.

## 2023-08-29 NOTE — Procedures
Physician Interpretation:  Symptoms of ''Chest Discomfort/Pain'' reported and correlated with a V paced rhythm at 80 bpm. These is a rate responsive V paced rhythm throughout. Two button presses correlated with ventricular pacing at 70 bpm.     Report Summary:      Technical Summary  - Predominant rhythm: Paced  - Paced rhythm  - PVC <0.1 %

## 2023-09-03 ENCOUNTER — Telehealth: Payer: BLUE CROSS/BLUE SHIELD

## 2023-09-03 NOTE — Telephone Encounter
The pt stated she is traveling for work and was in [place] in high elevation and she experienced 1 episode of dizziness on 10/26 and also 1 episode of brief right sided chest pain on 10/28. The pt stated the chest pain has resolved and she feels well.  The pt stated she does not have her remote monitoring system at this time. She was asked to transmit a remote transmission when she gets home which is estimated to be 11/1 or 11/2. Recent Holter monitor showed the device function as normal. She was asked to call our office if symptoms worsen. Continue to monitor.

## 2023-09-09 ENCOUNTER — Inpatient Hospital Stay: Payer: BLUE CROSS/BLUE SHIELD

## 2023-09-09 DIAGNOSIS — Z950 Presence of cardiac pacemaker: Secondary | ICD-10-CM

## 2023-09-09 DIAGNOSIS — R001 Bradycardia, unspecified: Secondary | ICD-10-CM

## 2023-09-09 NOTE — Procedures
SEE MEDIA SECTION FOR DOWNLOAD PRINTOUT     Ann Duran is a 47 y.o. female  [phone]  Reading MD:  Dr Wellington [place] Medtronic Carelink remote monitor was received as a initiated download due to a 1 episode of dizziness on 10/26 and also 1 episode of brief right sided chest pain on 10/28. The pt stated the chest pain has resolved and she feels well. . Device function is unchanged with chronically elevated RV thresholds and 9 months to ERI. Continue remote monitoring Q 1 month. See Media for full report.

## 2023-09-10 ENCOUNTER — Ambulatory Visit: Payer: BLUE CROSS/BLUE SHIELD

## 2023-09-10 ENCOUNTER — Ambulatory Visit: Payer: BLUE CROSS/BLUE SHIELD | Attending: Pediatric Cardiology

## 2023-09-10 ENCOUNTER — Ambulatory Visit: Payer: BLUE CROSS/BLUE SHIELD | Attending: Cardiovascular Disease

## 2023-09-18 ENCOUNTER — Ambulatory Visit: Payer: BLUE CROSS/BLUE SHIELD | Attending: Pediatric Cardiology

## 2023-09-18 ENCOUNTER — Ambulatory Visit: Payer: BLUE CROSS/BLUE SHIELD

## 2023-09-23 ENCOUNTER — Ambulatory Visit: Payer: BLUE CROSS/BLUE SHIELD

## 2023-09-23 MED ORDER — SILDENAFIL CITRATE 20 MG PO TABS
20 mg | ORAL_TABLET | Freq: Three times a day (TID) | ORAL | 3 refills | Status: AC
Start: 2023-09-23 — End: 2023-09-25

## 2023-09-24 ENCOUNTER — Ambulatory Visit: Payer: BLUE CROSS/BLUE SHIELD

## 2023-09-24 MED ORDER — SILDENAFIL CITRATE 20 MG PO TABS
20 mg | ORAL_TABLET | Freq: Three times a day (TID) | ORAL | 3 refills | Status: AC
Start: 2023-09-24 — End: ?

## 2023-09-30 ENCOUNTER — Ambulatory Visit: Payer: BLUE CROSS/BLUE SHIELD | Attending: Pediatric Cardiology

## 2023-09-30 ENCOUNTER — Inpatient Hospital Stay: Payer: BLUE CROSS/BLUE SHIELD | Attending: Pediatric Cardiology

## 2023-09-30 DIAGNOSIS — Z950 Presence of cardiac pacemaker: Secondary | ICD-10-CM

## 2023-09-30 DIAGNOSIS — R001 Bradycardia, unspecified: Secondary | ICD-10-CM

## 2023-10-01 NOTE — Progress Notes
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    Subjective:   Ann Duran is a 47 y.o. female who was seen in clinic for pacemaker evaluation. He cardiac history is remarkable foir a diagnosis of L-transposition/ single ventricle. Her first cardiac surgery was a Modified RA to pA Fonatn procedure at age 74. She required a thoracic duct ligation after her Fontan and then reportedly did well until age 57 when she presented with a complaint of fatigue and was diagnosed with atrial arrhythmias. She then underwent a Fontan conversion in Jan 2009 at which time pacemaker leads were placed but the device was placed 2 weeks later after she developed bradycardia and fluid retention. The atrial lead was non functional and so she was VVI paced. She underwent attempted atrial lead revision with diaphragm plication in April of 2010, but the new atrial lead also failed to function. She was tried on VVI backup pacing but was eventually changed to VVIR at 70 bpm lower rate, which she has tolerated well since 2011, but has had increasing requirement for ventricular pacing.    Interval Events:  Ann Duran was last seen in clinic on 07/17/23. Since then, she has been doing well overall.  In October she had a loss of capture which a local EP increased her programmed RV outputs.  She also experienced an episode of dizziness on 10/26 and an episode of chest pain on 10/28. All symptoms have since resolved. She denies any recent palpitations.         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  Negative tobacco use.  Rare EtOH.    Review of Systems:  14 point review of system negative other than as stated above    Medications:  Outpatient Medications Prior to Visit   Medication Sig Dispense Refill    acetaminophen 325 mg tablet Take 2 tablets (650 mg total) by mouth.      amoxicillin 500 mg tablet       Ascorbic Acid (VITAMIN C) 1000 MG tablet Take 1 tablet (1,000 mg total) by mouth as needed for.      aspirin 81 mg EC tablet Take 1 tablet (81 mg total) by mouth daily. 90 tablet 3    Cholecalciferol (VITAMIN D) 125 MCG (5000 UT) CAPS Take by mouth.      clonazePAM 0.5 mg tablet TAKE 1 TABLET(0.5 MG) BY MOUTH AT NIGHT AS NEEDED FOR INSOMNIA  fluticasone 50 mcg/act nasal spray   0    furosemide 20 mg tablet Take 3 tablets (60 mg total) by mouth daily. 260 tablet 3    levonorgestrel (MIRENA, 52 MG,) 20 mcg/day IUD by Intrauterine route.      lisinopril 2.5 mg tablet Take 1 tablet (2.5 mg total) by mouth daily. 90 tablet 3    PANTOPRAZOLE 20 mg DR tablet TAKE 1 TABLET BY MOUTH DAILY AS  NEEDED 90 tablet 3    potassium chloride 10 MEQ tablet Take 1 tablet (10 mEq total) by mouth daily. 90 tablet 3    sildenafil 20 mg tablet Take 1 tablet (20 mg total) by mouth three (3) times daily. 270 tablet 3    spironolactone 50 mg tablet Take 3 tablets (150 mg total) by mouth daily. (Patient taking differently: Take 2 tablets (100 mg total) by mouth daily.) 270 tablet 3    valacyclovir 500 mg tablet ValACYclovir HCl - 500 MG Oral Tablet      zolpidem 5 mg tablet Take 0.5 tablets (2.5 mg total) by mouth at bedtime as needed for Sleep.      citalopram 20 mg tablet Take 1 tablet (20 mg total) by mouth. (Patient not taking: Reported on 09/30/2023)      cyanocobalamin 1000 mcg tablet Take 1 tablet (1,000 mcg total) by mouth as needed for. (Patient not taking: Reported on 07/17/2023)      furosemide 80 mg tablet Please take Furosemide 80mg  in the morning and 40mg  in the afternoon. (Patient not taking: Reported on 07/17/2023) 120 tablet 3     No facility-administered medications prior to visit.     Allergies:  No Known Allergies      Objective:     Vitals:  BP 103/65 (BP Location: Right arm, Patient Position: Sitting, Cuff Size: Regular)  ~ Pulse 73  ~ Temp 36.8 ?C (98.2 ?F) (Tympanic)  ~ Resp 18  ~ Ht 1.645 m (5' 4.76'')  ~ Wt 61.4 kg (135 lb 5.8 oz)  ~ SpO2 94%  ~ BMI 22.69 kg/m?  Facility age limit for growth %iles is 20 years. Facility age limit for growth %iles is 20 years. Facility age limit for growth %iles is 20 years.    General:   alert and no distress   Skin:   warm and dry, no hyperpigmentation, vitiligo, or suspicious lesions   Nodes:   cervical, supraclavicular, and axillary nodes normal.   Eyes:   No conjunctival injection, sclerae anicteric   Oropharynx:  lips, mucosa, and tongue normal; teeth and gums normal   Neck:  no adenopathy, no carotid bruit, no JVD, supple, symmetrical, trachea midline and thyroid not enlarged, symmetric, no tenderness/mass/nodules   Lungs:   clear to auscultation bilaterally   Heart:   regular rate and rhythm, S1: single and S2: normal   Abdomen:   soft, non-tender; bowel sounds normal; no masses,  no organomegaly   Extremities:  extremities normal, atraumatic, no cyanosis or edema   Psychiatric:   normal mood, behavior, speech, dress, and thought processes     ECG today shows V pacing, and when pacing turned down, junctional rhythm at 57 bpm. No atrial activity present.    Assessment:   47 y.o. female with DORV status post Fontan conversion to extracardiac baffle on 11/21/2007, with subsequent placement of an epicardial pacemaker for sinus node dysfunction.  She had atrial lead failure shortly after initial placement.  In 2010 she underwent an unsuccessful attempt at placement of a  new atrial lead.    She returns today for routine follow up. She underwent pacemaker generator replacement (Medtronic) on 12/12/17 by Dr. Larwance Sachs. She remains in VVI mode. She continues to be stable with ventricular pacing, no indicatioon for intervention to establish atrial pacing at this time. Battery has around 9 months left.  She currently has an impending ventricular lead failure. In light of that we discussed three options for placing anew lead. A tranvenous atrial lead placed via LPA puncture from a right subclavian approach, a minimal sub xiphoid incision with a new V lead or a sternotomy with attempted placement of an atrial lead. Procedure process was discussed along with the risks and benefits.      Plan/ Recommendation:     Remote monitoring every month.  Schedule procedure.    Vania Rea. Carollee Herter M.D.  Pediatric Cardiology

## 2023-10-02 ENCOUNTER — Inpatient Hospital Stay: Payer: BLUE CROSS/BLUE SHIELD

## 2023-10-02 ENCOUNTER — Ambulatory Visit: Payer: BLUE CROSS/BLUE SHIELD | Attending: Pediatric Cardiology

## 2023-10-02 ENCOUNTER — Ambulatory Visit: Payer: BLUE CROSS/BLUE SHIELD

## 2023-10-05 DIAGNOSIS — Z950 Presence of cardiac pacemaker: Secondary | ICD-10-CM

## 2023-10-05 DIAGNOSIS — T82110A Breakdown (mechanical) of cardiac electrode, initial encounter: Secondary | ICD-10-CM

## 2023-10-05 DIAGNOSIS — R001 Bradycardia, unspecified: Secondary | ICD-10-CM

## 2023-10-05 DIAGNOSIS — I495 Sick sinus syndrome: Secondary | ICD-10-CM

## 2023-10-08 ENCOUNTER — Inpatient Hospital Stay: Payer: BLUE CROSS/BLUE SHIELD

## 2023-10-08 ENCOUNTER — Ambulatory Visit: Payer: BLUE CROSS/BLUE SHIELD

## 2023-10-08 DIAGNOSIS — Q208 Other congenital malformations of cardiac chambers and connections: Secondary | ICD-10-CM

## 2023-10-08 DIAGNOSIS — R001 Bradycardia, unspecified: Secondary | ICD-10-CM

## 2023-10-08 DIAGNOSIS — Z950 Presence of cardiac pacemaker: Secondary | ICD-10-CM

## 2023-10-08 NOTE — Procedures
SEE MEDIA SECTION FOR DOWNLOAD PRINTOUT     Ann Duran is a 47 y.o. female  [id number]  Reading MD:  Dr Carollee Herter    A Medtronic Carelink remote monitor was received as a patient initiated download. Battery 9 months until ERI. The device function is normal with no recorded events. A voicemail was left to assess the reason for the initiated remote transmission. The pt was asked to call the [facility] office at their earliest convenience. Continue remote monitoring Q1 month for battery surveillance. See Media for full report.

## 2023-10-09 NOTE — Procedures
SEE MEDIA SECTION FOR PROGRAMMER PRINTOUT   [facility] EP Device Clinic Pacemaker     PATIENT: Ann Duran  MRN: [id number]  DOB: 11/14/1975     DATE OF PROCEDURE: 09/30/2023  TIME OF PROCEDURE: 1512     INDICATIONS: Annual pacemaker evaluation   PROCEDURE: Pacemaker evaluation     Location: [facility]     Technician: Shanon Payor, RN     READING PHYSICIAN:  Dr. Charlesetta Garibaldi     Device:  Device Manufacturer: Medtronic  Device Model Name: Jamse Mead DR MRI  Model Number: [id number]  Device Serial Number: [id number]  Device Implant Date: 12/12/2017  Indication for Implant:      RA lead:   Manufacturer: Medtronic   Model Number: [id number]  Serial Number: [id number]  Date of Implant: 12/05/2007     RV lead:  Manufacturer: Medtronic   Model Number: [id number]  Serial Number: [id number]  Date of Implant: 12/05/2007     Battery: 9 months     Underlying Rhythm: V rate 57 bpm  R wave: 18.1 mV     Impedance:  A: 494 ohms  V: 570 ohms     Capture threshold  V: 3.5 V @ 0.4 ms     Since 08/07/2023     Tachy episode summary:  VT/VF: 0        VP: 99.7%     Programmed changes:   None      Settings:   Mode: VVIR  Rate: Lower/Upper Sensor: 70/150 bpm  RV Output: 3.5 V @ 0.8 ms  RV Sensitivity: 2.0 mV     Assessment  1. 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 S/p Fontan conversion to extracardiac baffle on 11/21/07.  2. Underwent surgical placement of abdominal pacemaker generator on 12/05/07, but atrial lead was non-capturing, now with single site ventricular pacing. Generator change 12/12/2017 by Dr. Larwance Sachs  3. Patient is not pacemaker dependent at this time, underlying JR in 50's bpm.  4. Medtronic single chamber pacer with normal function, 9 months to ERI.  5. No high rate episodes.     Plan:  1. Patient seen today by Dr. Charlesetta Garibaldi, see separate EP note.  2. No programming changes.  3. Monthly remote monitoring for close battery surveillance.  4. Arrange for mapping to place atrial lead during generator change out.    Shanon Payor, RN

## 2023-10-17 ENCOUNTER — Telehealth: Payer: BLUE CROSS/BLUE SHIELD

## 2023-10-18 ENCOUNTER — Telehealth: Payer: BLUE CROSS/BLUE SHIELD

## 2023-10-18 NOTE — Telephone Encounter
Phone Message/Shannon/Advice/Martell  MRN: [id number]/ Pt is asking Dr. Carollee Herter to please call Dr. Rosalin Hawking and have her office reach out for a consult. She called twice and was unable to speak with her. She would like to discuss the surgery with her. Pt would also like clinical notes form appt dates 09/11 and 11/25. Call back number: [phone]

## 2023-10-22 ENCOUNTER — Ambulatory Visit: Payer: BLUE CROSS/BLUE SHIELD | Attending: Pediatric Cardiology

## 2023-10-22 ENCOUNTER — Ambulatory Visit: Payer: BLUE CROSS/BLUE SHIELD

## 2023-10-23 NOTE — Consults
Patient Consent to Telehealth   The patient agreed to participate in the video visit prior to joining the visit.          Cardiac Surgery History and Physical    NAME: Ann Duran  MRN:  [id number]  DATE OF SERVICE: 10/25/2023  SPECIALTY: Cardiothoracic Surgery  CARDIOTHORACIC SURGEON:  Lesly Rubenstein, MD  CARDIOLOGIST:   Dyanne Iha, MD and Charlesetta Garibaldi, MD  PRIMARY MD:     Lilla Shook, DO    CHIEF COMPLAINT:       Chief Complaint   Patient presents with    Consult For     Pacemaker ERI, nonfunctioning atrial lead, V lead approaching end of life       HISTORY OF PRESENT ILLNESS:       Ann Duran is a 47 y.o. 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   Nov 2008 atrial fibrillation, 10/14/2007 s/p cardioversion, and cardiac cath - excellent RA/Fontan press  11/21/2007 - Fontan conversion to Extracardiac Fontan, bidirectional Sherrine Maples, placement of epicardial pacing leads, DC 1/24  12/03/2007 - readmission for large pleural effusion s/p thoracentesis  - bradycardia and junctional rhythm  1/30/209  - s/p surgical abdominal pacemaker generator placement.  Atrial lead not capturing.  DC 12/12/2007 following aggressive diuresis for significant ascites, perineal edema.  Diuresis over following 6 weeks, resolution of ascites  03/02/2009 - left hemidiaphragm plication by Dr. Larwance Sachs,  unsuccessful attempt at placement of atrial lead.     Pacemaker set at VVI 90 bpm, reduced to 60 bpm by Dr. Carollee Herter.  Subsequently rate response activated 05/2010 with improved exercise ability.  07/2016  liver US   08/08/2017 - cardiac cath - mean Fontan pressures 17 mmHg.  Liver biopsy showed extensive bridging fibrosis, no cirrhosis. Spirinolactone dose doubled   12/12/2017 - underwent pacemaker generator replacement (Medtronic) by Dr. Larwance Sachs  2020 - moved to [place] for work.    06/01/2021  cardiac cath and liver biopsy by Dr. Michiel Sites at [facility], [facility].  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    05/27/23 seen urgently in [facility] clinic with increased abdominal distension (increased salt intake), diuretics increased, evidence of AKI, diuretic dosing adjusted.    09/30/23 seen by Dr. Carollee Herter for routine f/u.  Per his note - Stable with ventricular pacing, no indication for intervention to establish atrial pacing. Battery around 9 months left.  Impending ventricular lead failure.  3 options presented.    Symptoms:  currently not having shortness of breath,  abdominal bloating constantly, energy level ok,  occasional lightheadedness, no palpitations   [place] is freezing cold  25 degrees.    Activity:   worked out on [employer]  (rowing, treadmill, [employer])     RISK FACTORS:   Diabetes: None  Chronic kidney disease with dialysis: No  Dyslipidemia:  No  Hypertension:  No  Current cigarette smoker: No    PREOPERATIVE CARDIAC STATUS  Endocarditis:  No.  Prior Myocardial Infarction:  No  Cardiac presentation/symptoms:  No symptoms  Anginal classification within 2 weeks: CCS Class 0  Heart failure within  2 weeks:  Yes.  NYHA Classification:  Indicate the patient's worst dyspnea or functional class, coded as the New York Heart Association (NYHA) classification within the past 2 weeks:   Class II   Type of heart failure:   Prior heart failure:  Yes  End stage heart failure (stage 4): No  Cardiogenic shock:  No document symptoms.  Resuscitation:  No.   Arrythmias: Yes  Ventricular:   Atrial:     Type of atrial fibrillation, if applicable: N/A    PAST MEDICAL HISTORY:       Past Medical History: Diagnosis Date    Anxiety     controlled per pt    Cholelithiases     Noted on ultrasound, asymptomatic     Delayed emergence from general anesthesia 2010    pacemaker insertion, per patient    DORV (double outlet right ventricle)     GERD (gastroesophageal reflux disease)     well controlled w/med and diet    History of blood transfusion     HPV (human papilloma virus) infection     IUD (intrauterine device) in place     Mirena placed 12/27/10, replaced 12/2015, replaced November 16, 2016    Pacemaker     in abd     Pulmonary disease:  No.  Cerebrovascular disease:  No.  CVD TIA: No  CVD carotid stenosis:  None   Previous carotid artery surgery and/or stenting: No  Renal disease:  None  Liver disease: Yes  Child Pugh: Unknown  Cirrhosis of the liver: No  Hx of GERD:  Severe  Hx of mediastinal radiation:  No  Obstructive sleep apnea:   No  Thoracic aorta disease: No  Cancer within 5 years: No  Immunocompromised:  No  Unresponsive state:  No  Peripheral artery disease:  No  Home oxygen use:  No    PAST SURGICAL HISTORY:      Past Surgical History:   Procedure Laterality Date    CERVICAL BIOPSY  W/ LOOP ELECTRODE EXCISION  11/16/2016    Also, Mirena IUD replacement as well as excision of vulvar skin    Diaphragmatic plication  2010    Exam under anesthesia; hysteroscopy; dilation and curettage; placement of a Mirena intrauterine device  12/27/10    FONTAN PROCEDURE  1982    Modified    INSERT / REPLACE / REMOVE PACEMAKER  2009    Revise Fontan, Maze procedure       Septal Myectomy: No  Permanent Pacemaker: Yes    ALLERGIES:     No Known Allergies    MEDICATIONS:       Medications that the patient states to be currently taking   Medication Sig    acetaminophen 325 mg tablet Take 2 tablets (650 mg total) by mouth.    amoxicillin 500 mg tablet     Ascorbic Acid (VITAMIN C) 1000 MG tablet Take 1 tablet (1,000 mg total) by mouth as needed for.    aspirin 81 mg EC tablet Take 1 tablet (81 mg total) by mouth daily. Cholecalciferol (VITAMIN D) 125 MCG (5000 UT) CAPS Take by mouth.    citalopram 20 mg tablet Take 1 tablet (20 mg total) by mouth.    clonazePAM 0.5 mg tablet TAKE 1 TABLET(0.5 MG) BY MOUTH AT NIGHT AS NEEDED FOR INSOMNIA    cyanocobalamin 1000 mcg tablet Take 1 tablet (1,000 mcg total) by mouth as needed for.    fluticasone 50 mcg/act nasal spray  furosemide 20 mg tablet Take 3 tablets (60 mg total) by mouth daily.    levonorgestrel (MIRENA, 52 MG,) 20 mcg/day IUD by Intrauterine route.    lisinopril 2.5 mg tablet Take 1 tablet (2.5 mg total) by mouth daily.    PANTOPRAZOLE 20 mg DR tablet TAKE 1 TABLET BY MOUTH DAILY AS  NEEDED    potassium chloride 10 MEQ tablet Take 1 tablet (10 mEq total) by mouth daily.    sildenafil 20 mg tablet Take 1 tablet (20 mg total) by mouth three (3) times daily.    spironolactone 50 mg tablet Take 3 tablets (150 mg total) by mouth daily. (Patient taking differently: Take 2 tablets (100 mg total) by mouth daily.)    valacyclovir 500 mg tablet ValACYclovir HCl - 500 MG Oral Tablet    zolpidem 5 mg tablet Take 0.5 tablets (2.5 mg total) by mouth at bedtime as needed for Sleep.       SOCIAL HISTORY:     Jacaria Liggon Shor is single and lives alone. No children  Occupation: fundraising higher ed   travel frequently for work  Social History     Socioeconomic History    Marital status: Single   Tobacco Use    Smoking status: Never    Smokeless tobacco: Never   Substance and Sexual Activity    Alcohol use: Yes     Alcohol/week: 1.2 oz     Types: 2 Standard drinks or equivalent per week     Comment: sometimes    Drug use: No    Sexual activity: Yes     Partners: Male     Birth control/protection: IUD (hormonal)     Social Determinants of Health     Physical Activity: Medium Risk (05/11/2021)    Received from [facility]    Physical Activity     How often do you engage in moderate physical activity for 30 minutes or more?: 4 to 6 days a week     How often do you engage in vigorous physical activity for 20 minutes or more?: 1 to 3 days a week     How many hours per day do you spend sitting?: 4 to 6 hours   Stress: Medium Risk (09/08/2023)    Received from [facility]    Stress     Stress in your Life: 2     Dealing with Stress: 2       FAMILY HISTORY:      Family history reviewed.  Premature CAD: 1st generation family member (parents/sibling/children) with CAD diagnosed and/or treated prior to age 76 for female relatives or less than 65 years for female relatives: No  Mother:     alive no heart ds  Father:       alive  MV prolapse    Siblings:    1 older brother no     REVIEW OF SYSTEMS:       A full 14 point review of systems was conducted.  The pertinent positives were listed in the HPI.    General: Denies fever, chills, night sweats.  Right handed.  Weight stable    Wt.135-140   Ht.  5 5  Neuro: Denies: numbness, seizure, stroke, TIA  HEENT: occasional headaches, occasional epistaxis  Denies vision problems, glasses/contacts, hearing issues, dysphagia, hoarseness, sorethroat, dentures  Cardiac:  Denies: chest pain (at rest or on exertion), shortness of breath, dyspnea on exertion, fatigue, orthopnea, PND, lower extremity edema, palpitation, syncope  Pulmonary:  Denies: cough, hemoptysis, asthma, pneumonia, tuberculosis  GI: GERD  Denies: constipation, diarrhea, jaundice, melena, nausea, vomiting, PUD  Genitourinary: Denies: discharge, dysuria, frequency, hematuria, nocturia, urgency    Musculoskeletal: neck and back pain, shoulder    Skin: few bruises, minor Denies: infection, rash  Endocrine: Denies:  diabetes, thyroid problems  Hematology: Denies: bleeding dyscrasias  Vascular: Denies: claudication, varicose veins  Immunologic: Denies: immune compromised  Psychiatric: some anxiety due to health, and normal worries  Denies: depression    PHYSICAL EXAM:       There were no vitals filed for this visit.    Wt Readings from Last 3 Encounters:   09/30/23 135 lb 5.8 oz (61.4 kg) 07/17/23 136 lb 12.8 oz (62.1 kg)   07/17/23 136 lb 11 oz (62 kg)     Telehealth visit - limited physical exam  General appearance: No acute distress,  Neuro: Grossly intact, Alert and oriented x 4.   HEENT:  Video visit, periorbital puffiness, perioral edema   Lungs: Respiratory effort is normal.  Chest:  Left posterolateral thoracotomy scar in 5th intercostal space, chest tube scars x2 left chest, median sternotomy scar well healed  Cardiac:  No heave   Abdomen: Soft, nontender, distended.  Mild ascites  Extremities: Warm and well perfused.  No edema, clubbing, or cyanosis.    Musculoskeletal: Grossly intact.  No muscular atrophy.  Skin: No rashes or lesions noted.      LABORATORY DATA:      CBC:    Lab Results   Component Value Date    WBC 5.30 05/25/2023    HGB 12.0 05/25/2023    HCT 36.7 05/25/2023    PLT 99 (L) 05/25/2023     ELECTROLYTES:    Lab Results   Component Value Date    NA 131 (L) 07/04/2023    K 5.2 07/04/2023    CL 100 07/04/2023    CO2 25 07/04/2023    BUN 29 (H) 07/04/2023    CREAT 1.20 (H) 07/04/2023    GLUCOSE 88 07/04/2023     HGBA1C:    Lab Results   Component Value Date    HGBA1C 5.7 (H) 12/10/2017     LIVER FUNCTION:    Lab Results   Component Value Date    AST 24 05/25/2023    ALT 22 05/25/2023    ALKPHOS 68 05/25/2023    ALBUMIN 5.2 (H) 05/25/2023     CHOLESTEROL:  No results found for: ''CHOL'', ''CHOLDLCAL''  COAGS:    Lab Results   Component Value Date    PT 15.2 (H) 05/25/2023    INR 1.2 05/25/2023     BNP:   Lab Results   Component Value Date    BNP 48 07/04/2023     No results found for: ''CHOL''    DIAGNOSTIC STUDIES:        EKG: 07/22/23  Junctional bradycardia, vent rate 57 bpm, borderline right axis deviation, probable LVH with secondary repol abnrm    HOLTER MONITOR:  10/11-13/24  Physician Interpretation:  Symptoms of ''Chest Discomfort/Pain'' reported and correlated with a V paced rhythm at 80 bpm. These is a rate responsive V paced rhythm throughout. Two button presses correlated with ventricular pacing at 70 bpm.      Report Summary:       Technical Summary  - Predominant rhythm: Paced  - Paced rhythm  - PVC <0.1 %         TRANSTHORACIC ECHO: 05/27/23  CONCLUSIONS:   1.  Double out right ventricle with hypoplastic LV and large VSD with ''  functional single ventricle'' Right ventricular hypertrophy. Low normal  systemic RV systolic function.  2. 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  3. Large secundum ASD. Status post extracardiac Fontan. Visualized portions of  the Fontan appear patent, no obvious fenestration noted. Sherrine Maples shunt appears  normal.  4. L-transposition of the great arteries.  5. A prior echo performed on 05/25/2022 was reviewed for comparison. No  significant changes noted since the previous study.   --------------------------------------------------------------------------  FINDINGS:   CONOTRUNCAL ANATOMY: The cardiac structural malformations are consistent with a diagnosis of levo-transposition of the great arteries.  LEFT VENTRICLE: Patient demonstrated normal sinus rhythm during echocardiogram. 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  RIGHT VENTRICLE: And DTI 3.3 cm/s. Large secundum ASD. Status post extracardiac Fontan. Visualized portions of the Fontan appear patent, no obvious fenestration noted. Sherrine Maples shunt appears widely patent with low velocity flow noted.  RIGHT ATRIUM: Right atrial pressure is estimated at 8 mmHg.  MITRAL VALVE: Normal mitral valve thickness. No mitral annular calcification. Trace mitral valve regurgitation.  TRICUSPID VALVE: Trace tricuspid valve regurgitation.  AORTIC VALVE:  No evidence of aortic valve regurgitation.  PULMONIC VALVE: The pulmonic valve was not well visualized.  AORTA: No coarctation of the aorta.  SYSTEMIC VEINS: The inferior vena cava is not well visualized.  PERICARDIUM: There is no evidence of pericardial effusion.    CARDIAC CATH: 06/01/21  [facility], [facility]              [place] ABD:  05/25/22  IMPRESSION:   1. Coarsening of hepatic echotexture with irregularity in surface contour suggesting fibrosis/cirrhosis. No suspicious liver lesions.   2. Cholelithiasis without evidence of cholecystitis.   3. Splenomegaly suggesting portal hypertension.        CT ABD + PELVIS WO + w CONTRAST:  07/17/23  FINDINGS:   HEPATOBILIARY FINDINGS:  Liver parenchyma: Enhancement pattern compatible with hepatic congestion.  Liver lesions:  Portal vein: Patent.  Hepatic artery: Patent. Replaced right hepatic artery arising from the SMA.  Hepatic veins: Patent.  Gallbladder and bile ducts: Multiple calcified gallstones with diffusely thickened gallbladder wall.Marland Kitchen  Spleen: Enlarged, measuring approximately 17.4 cm..  Collateral vasculature: Perigastric/perisplenic collaterals.  Ascites: Small.     ADDITIONAL FINDINGS:  Lower chest: Partial visualization of postsurgical changes related to Fontan procedure, median sternotomy wires, cardiac pacemaker, and multiple abandoned leads. Left basilar atelectasis and/or scarring. Left pleural thickening.  Pancreas: Unremarkable.  Adrenals: Unremarkable.  Kidneys and ureters: Unremarkable.  Bowel: Small hiatal hernia. Nondilated bowel with no wall thickening.  Bladder: Unremarkable.  Reproductive organs: Intrauterine IUD with normal position noted..  Lymph nodes: Unremarkable.  Vessels: As above.  Abdominal wall: Small fat-containing umbilical hernia.  Bones: Scattered osseous degenerative changes.      IMPRESSION:   Hepatic congestion with sequela portal hypertension including portosystemic collaterals, splenomegaly, and small volume ascites. No focal enhancing mass lesion.   Cholelithiasis, with diffusely thickened gallbladder wall likely in part due to congestion and/or underdistention.       REMOTE PACEMAKER INTERROGATION 08/07/23  MD note  A Medtronic Carelink remote monitor was received as a routine download. Device function unchanged with known elevated RV thresholds. 1 beat of potential RV loss of capture identified on the presenting rhythm. No high rate episodes noted. A voicemail was left to assess whether the pt plans to stay here at [facility] or will establish primary EP care  closer to home. The pt was asked to call the [facility] office at her earliest convenience.  The pt called back and stated she has been feeling dizzy on and off x 2 days 9/30 and 10/1. The pt was informed we did see a single RV loss of capture on the 08/02/2023 remote transmission and due to potential symptomatic episodes we recommend following up with a local EP for device evaluation and if symptoms worsen to report to a local ER. The pt stated she has not established care with a local EP but will attempt to obtain care today. The pt was able to be seen by a local EP and they were able to obtain thresholds of 2V @ 1ms and 1.75 V @ 1.2 ms with no loss of capture noted. Due to the battery life of 13 months until ERI the pt was programmed at 3.5V @ 1.2 ms (monitor) and 2 day Holter monitor will be mailed to assess the device function. [facility] scheduling will also be notified to schdule the pt in November or earliest avaialble for an in office device evaluation. Continue to monitor. See Media for full report.  - Dr. Carollee Herter      IMPRESSION:      Ann Duran is a 47 y.o. 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  The patient now presenting with ventricular lead failure and Fontan failure symptoms for surgical placement of atrial lead and generator change.    PLAN:      [x]  Routine laboratory data (including CBC, complete metabolic panel, coags) to establish and evaluate patient?s baseline.  [x]  Obtain HgbA1c to evaluate recent history of glycemic control, evaluate risk for postoperative complications and to optimize glucose levels prior to surgery if necessary.  [x]  CT Scan of chest (noncontrast) to evaluate the patient?s retrosternal anatomy, specifically the distance from the aorta to the posterior table of the sternum and the right ventricle to the posterior table of the sternum.   [x]  Echocardiogram to evaluate preoperative function.  [x]  Pulmonary function test to stratify postopertive respiratory complications.  [x]  Dental Clearance   [x] Electrophysiological mapping of the atrial to identify site for pacemaker lead implantation    I discussed the advantages and disadvantages of various options for placement of atrial and ventricular leads including  Transcatheter placement of the lead the RSA of the pulmonary artery   Left redo thoracotomy x3 with atrial and ventricular lead placement  Redo median sternotomy for placement of atrial lead  Of these the thoracotomy option was most acceptable to the patient, however we would need axial imaging of the chest in order to determine feasibility and prognosticate on risk.  In addition the patient was advised that complications such as recurrent chylothorax, phrenic nerve injury, vocal cord injury, as well as gastrointestinal motility issues may arise as a result of dissection in the posterior mediastinum.  Patient asked complex questions that were answered at length.  Plan is to proceed with CT chest and EP mapping of the atrial tissue to identify site for optimal atrial pacing prior to surgical preoperative preparation.

## 2023-10-25 ENCOUNTER — Telehealth: Payer: BLUE CROSS/BLUE SHIELD

## 2023-10-25 DIAGNOSIS — T82110A Breakdown (mechanical) of cardiac electrode, initial encounter: Secondary | ICD-10-CM

## 2023-10-28 NOTE — Telephone Encounter
Phn msg/MRN [id number]/# [phone] / call back request/ Patient is requesting a call back fro Dr. Carollee Herter to discuss her conversation had with Dr. Rosalin Hawking Thank you

## 2023-10-30 ENCOUNTER — Inpatient Hospital Stay: Payer: BLUE CROSS/BLUE SHIELD

## 2023-11-01 ENCOUNTER — Telehealth: Payer: BLUE CROSS/BLUE SHIELD

## 2023-11-01 DIAGNOSIS — R001 Bradycardia, unspecified: Secondary | ICD-10-CM

## 2023-11-01 DIAGNOSIS — Z950 Presence of cardiac pacemaker: Secondary | ICD-10-CM

## 2023-11-01 NOTE — Telephone Encounter
I called pt, she already spoke to Robynn

## 2023-11-01 NOTE — Procedures
SEE MEDIA SECTION FOR DOWNLOAD PRINTOUT     Ann Duran is a 47 y.o. female  [id number]  Reading MD:  Dr Carollee Herter    A Medtronic Carelink remote monitor was received as a patient initiated download. Battery 8 months until ERI. The device function is normal with no recorded events.  Continue remote monitoring Q1 month for battery surveillance. Plans for a new A and V lead via left thoracotomy approach by CT surgery.  See Media for full report.

## 2023-11-01 NOTE — Telephone Encounter
Phn msg/MRN [id number]  /# [phone]/ call back request/ Patient is requesting a call back from Dr. Carollee Herter she denied to provide further information  / Thank you

## 2023-11-08 ENCOUNTER — Inpatient Hospital Stay: Payer: BLUE CROSS/BLUE SHIELD | Attending: Cardiovascular Disease

## 2023-11-08 ENCOUNTER — Other Ambulatory Visit: Payer: BLUE CROSS/BLUE SHIELD

## 2023-11-08 DIAGNOSIS — Q201 Double outlet right ventricle: Secondary | ICD-10-CM

## 2023-11-08 DIAGNOSIS — Q208 Other congenital malformations of cardiac chambers and connections: Secondary | ICD-10-CM

## 2023-11-08 MED ADMIN — IOHEXOL 350 MG/ML IV SOLN: 100 mL | INTRAVENOUS | @ 23:00:00 | Stop: 2023-11-08 | NDC 00407141491

## 2023-11-08 MED ADMIN — SODIUM CHLORIDE 0.9 % IV BOLUS: 100 mL | INTRAVENOUS | @ 23:00:00 | Stop: 2023-11-08 | NDC 00338004918

## 2023-11-08 NOTE — Telephone Encounter
Dr. Carollee Herter stated he left a voicemail and is corresponding with patient via email

## 2023-11-13 ENCOUNTER — Other Ambulatory Visit: Payer: BLUE CROSS/BLUE SHIELD

## 2023-11-14 NOTE — Telephone Encounter
Sent e-mail to Dr Carollee Herter to respond to her.

## 2023-11-18 ENCOUNTER — Telehealth: Payer: BLUE CROSS/BLUE SHIELD

## 2023-11-18 ENCOUNTER — Inpatient Hospital Stay: Payer: BLUE CROSS/BLUE SHIELD

## 2023-11-18 ENCOUNTER — Inpatient Hospital Stay: Payer: BLUE CROSS/BLUE SHIELD | Attending: Pediatric Cardiology

## 2023-11-18 NOTE — Telephone Encounter
Called and spoke to PATIENT in regard to scheduling cath procedure.     Confirmed cath date of 12/10/23 at 7:30 AM with check in time of 5:30 AM.    Check-in time, medication instructions and fasting guidelines were discussed.

## 2023-11-21 ENCOUNTER — Other Ambulatory Visit: Payer: BLUE CROSS/BLUE SHIELD

## 2023-11-22 ENCOUNTER — Other Ambulatory Visit: Payer: BLUE CROSS/BLUE SHIELD

## 2023-11-26 ENCOUNTER — Ambulatory Visit: Payer: BLUE CROSS/BLUE SHIELD

## 2023-11-26 ENCOUNTER — Other Ambulatory Visit: Payer: BLUE CROSS/BLUE SHIELD

## 2023-11-26 DIAGNOSIS — T82110A Breakdown (mechanical) of cardiac electrode, initial encounter: Secondary | ICD-10-CM

## 2023-11-26 DIAGNOSIS — Q208 Other congenital malformations of cardiac chambers and connections: Secondary | ICD-10-CM

## 2023-11-27 ENCOUNTER — Other Ambulatory Visit: Payer: BLUE CROSS/BLUE SHIELD

## 2023-11-27 ENCOUNTER — Inpatient Hospital Stay: Payer: BLUE CROSS/BLUE SHIELD

## 2023-11-27 DIAGNOSIS — Z950 Presence of cardiac pacemaker: Secondary | ICD-10-CM

## 2023-11-27 DIAGNOSIS — R001 Bradycardia, unspecified: Secondary | ICD-10-CM

## 2023-11-28 ENCOUNTER — Other Ambulatory Visit: Payer: BLUE CROSS/BLUE SHIELD

## 2023-11-28 ENCOUNTER — Telehealth: Payer: BLUE CROSS/BLUE SHIELD

## 2023-11-28 NOTE — Telephone Encounter
Spoke with patient in detail about case on 12/10/2023. No need to stop any medications. All questions answered.

## 2023-11-28 NOTE — Procedures
SEE MEDIA SECTION FOR DOWNLOAD PRINTOUT     Ann Duran is a 48 y.o. female  [id number]  Reading MD: Dr Riley Lam Medtronic Carelink remote monitor was received as a routine download. Device function is normal with no recorded arrhythmia. Battery 8 months until ERI with planned replacement 12/2023. Continue to monitor Q1 month. See Media for full report.

## 2023-11-29 ENCOUNTER — Other Ambulatory Visit: Payer: PRIVATE HEALTH INSURANCE

## 2023-12-02 ENCOUNTER — Ambulatory Visit: Payer: PRIVATE HEALTH INSURANCE | Attending: Pediatric Cardiology

## 2023-12-04 ENCOUNTER — Other Ambulatory Visit: Payer: PRIVATE HEALTH INSURANCE

## 2023-12-09 ENCOUNTER — Other Ambulatory Visit: Payer: BLUE CROSS/BLUE SHIELD

## 2023-12-10 ENCOUNTER — Ambulatory Visit: Payer: BLUE CROSS/BLUE SHIELD

## 2023-12-10 DIAGNOSIS — Q208 Other congenital malformations of cardiac chambers and connections: Secondary | ICD-10-CM

## 2023-12-10 DIAGNOSIS — Z950 Presence of cardiac pacemaker: Secondary | ICD-10-CM

## 2023-12-10 DIAGNOSIS — I495 Sick sinus syndrome: Secondary | ICD-10-CM

## 2023-12-10 LAB — COOXIMETRY,POC: HEMOGLOBIN,POC: 14.4 g/dL (ref <2.7)

## 2023-12-10 LAB — Sodium,POC: SODIUM,POC: 137 mmol/L (ref 135–146)

## 2023-12-10 LAB — CO2,POC: CO2,POC: 21 mmol/L (ref 20–30)

## 2023-12-10 LAB — Hematocrit: HEMATOCRIT: 39.8 % (ref 34.9–45.2)

## 2023-12-10 LAB — Activated Clotting Time, POC
ACTIVATED CLOTTING TIME HIGH RANGE, POCT: 124 s
ACTIVATED CLOTTING TIME HIGH RANGE, POCT: 196 s
ACTIVATED CLOTTING TIME HIGH RANGE, POCT: 250 s
ACTIVATED CLOTTING TIME HIGH RANGE, POCT: 265 s
ACTIVATED CLOTTING TIME HIGH RANGE, POCT: 360 s
ACTIVATED CLOTTING TIME HIGH RANGE, POCT: 304 s
ACTIVATED CLOTTING TIME HIGH RANGE, POCT: 351 s
ACTIVATED CLOTTING TIME HIGH RANGE, POCT: 383 s

## 2023-12-10 LAB — Platelet Count: MEAN PLATELET VOLUME: 10.9 fL (ref 9.3–13.0)

## 2023-12-10 LAB — APTT: APTT: 30.1 s (ref 24.4–36.2)

## 2023-12-10 LAB — Prothrombin Time Panel: PROTHROMBIN TIME: 15.2 s — ABNORMAL HIGH (ref 11.5–14.4)

## 2023-12-10 LAB — Hemoglobin: HEMOGLOBIN: 13.1 g/dL (ref 11.6–15.2)

## 2023-12-10 LAB — Pregnancy Test,Urine: PREGNANCY TEST,URINE: NEGATIVE

## 2023-12-10 LAB — Potassium,POC: POTASSIUM,POC: 3.8 mmol/L (ref 3.6–5.3)

## 2023-12-10 LAB — CREATININE: CREATININE: 1.18 mg/dL (ref 0.60–1.30)

## 2023-12-10 LAB — Urea Nitrogen: UREA NITROGEN: 24 mg/dL — ABNORMAL HIGH (ref 7–22)

## 2023-12-10 LAB — Electrolyte Panel: TOTAL CO2: 21 mmol/L (ref 20–30)

## 2023-12-10 LAB — Glucose,POC: GLUCOSE,POC: 93 mg/dL (ref 65–99)

## 2023-12-10 LAB — Blood Gases, arterial,POC: O2 SAT/MEASURED,ARTERIAL,POC: 93.4 % — ABNORMAL LOW (ref >=95.0)

## 2023-12-10 LAB — Ionized Calcium,POC: IONIZED CA,UNCORR,POC: 1.18 mmol/L (ref 1.09–1.29)

## 2023-12-10 LAB — Chloride,POC: CHLORIDE,POC: 103 mmol/L (ref 96–106)

## 2023-12-10 LAB — WBC Count, ANES: WHITE BLOOD CELL COUNT: 3.3 10*3/uL — ABNORMAL LOW (ref 4.16–9.95)

## 2023-12-10 LAB — LACTATE, POC: LACTATE, POCT: 6 mg/dL (ref 5–18)

## 2023-12-10 MED ADMIN — LIDOCAINE HCL (CARDIAC) 100 MG/5ML IV SOSY: INTRAVENOUS | @ 17:00:00 | Stop: 2023-12-10 | NDC 76329339001

## 2023-12-10 MED ADMIN — HEPARIN SODIUM (PORCINE) 1000 UNIT/ML IJ SOLN: INTRAVENOUS | @ 18:00:00 | Stop: 2023-12-10 | NDC 00409272002

## 2023-12-10 MED ADMIN — ROCURONIUM BROMIDE 50 MG/5ML IV SOLN: INTRAVENOUS | @ 19:00:00 | Stop: 2023-12-10 | NDC 39822420002

## 2023-12-10 MED ADMIN — PHENYLEPHRINE HCL (PRESSORS) 10 MG/ML IV SOLN: INTRAVENOUS | @ 18:00:00 | Stop: 2023-12-10 | NDC 23155062041

## 2023-12-10 MED ADMIN — FENTANYL CITRATE (PF) 100 MCG/2ML IJ SOLN: INTRAVENOUS | @ 18:00:00 | Stop: 2023-12-10 | NDC 00409909422

## 2023-12-10 MED ADMIN — HEPARIN SODIUM (PORCINE) 1000 UNIT/ML IJ SOLN: INTRAVENOUS | @ 17:00:00 | Stop: 2023-12-10 | NDC 00409272002

## 2023-12-10 MED ADMIN — SODIUM CHLORIDE 0.9 % IV SOLN: INTRAVENOUS | @ 17:00:00 | Stop: 2023-12-10 | NDC 00338004902

## 2023-12-10 MED ADMIN — IODIXANOL 320 MG/ML IV SOLN: 44 mL | INTRAVENOUS | @ 20:00:00 | Stop: 2023-12-10 | NDC 00407222316

## 2023-12-10 MED ADMIN — LIDOCAINE HCL (PF) 1 % IJ SOLN: .4 mL | INTRADERMAL | @ 15:00:00 | Stop: 2023-12-10 | NDC 63323049257

## 2023-12-10 MED ADMIN — FENTANYL CITRATE (PF) 100 MCG/2ML IJ SOLN: INTRAVENOUS | @ 20:00:00 | Stop: 2023-12-10 | NDC 00409909422

## 2023-12-10 MED ADMIN — ROCURONIUM BROMIDE 50 MG/5ML IV SOLN: INTRAVENOUS | @ 17:00:00 | Stop: 2023-12-10 | NDC 39822420002

## 2023-12-10 MED ADMIN — PROTAMINE SULFATE 10 MG/ML IV SOLN: INTRAVENOUS | @ 20:00:00 | Stop: 2023-12-10 | NDC 63323022905

## 2023-12-10 MED ADMIN — PHENYLEPHRINE HCL 10 MG/ML IV SOLN (ANES): INTRAVENOUS | @ 17:00:00 | Stop: 2023-12-10

## 2023-12-10 MED ADMIN — ACETAMINOPHEN 10 MG/ML IV SOLN: INTRAVENOUS | @ 20:00:00 | Stop: 2023-12-10 | NDC 00264410090

## 2023-12-10 MED ADMIN — SUGAMMADEX SODIUM 200 MG/2ML IV SOLN: INTRAVENOUS | @ 20:00:00 | Stop: 2023-12-10 | NDC 00006542312

## 2023-12-10 MED ADMIN — PHENYLEPHRINE HCL 10 MG/ML IV SOLN (ANES): INTRAVENOUS | @ 18:00:00 | Stop: 2023-12-10

## 2023-12-10 MED ADMIN — MIDAZOLAM HCL 10 MG/10ML IJ SOLN: INTRAVENOUS | @ 16:00:00 | Stop: 2023-12-10 | NDC 00409258705

## 2023-12-10 MED ADMIN — PROPOFOL 200 MG/20ML IV EMUL: INTRAVENOUS | @ 17:00:00 | Stop: 2023-12-10 | NDC 63323026929

## 2023-12-10 MED ADMIN — FENTANYL CITRATE (PF) 100 MCG/2ML IJ SOLN: INTRAVENOUS | @ 17:00:00 | Stop: 2023-12-10 | NDC 00409909422

## 2023-12-10 MED ADMIN — ONDANSETRON HCL 4 MG/2ML IJ SOLN: INTRAVENOUS | @ 20:00:00 | Stop: 2023-12-10 | NDC 60505613005

## 2023-12-10 MED ADMIN — ROCURONIUM BROMIDE 50 MG/5ML IV SOLN: INTRAVENOUS | @ 18:00:00 | Stop: 2023-12-10 | NDC 39822420002

## 2023-12-10 MED ADMIN — HEPARIN (PORCINE) IN NACL 25000-0.45 UT/250ML-% IV SOLN: INTRAVENOUS | @ 18:00:00 | Stop: 2023-12-10 | NDC 63323051774

## 2023-12-10 MED ADMIN — DEXAMETHASONE SODIUM PHOSPHATE 4 MG/ML IJ SOLN: INTRAVENOUS | @ 17:00:00 | Stop: 2023-12-10 | NDC 67457042312

## 2023-12-10 NOTE — Progress Notes
This video visit was scheduled and performed to discuss the upcoming mapping procedure which is planned for Feb. 4 2025. The plan is to perform a right heart catheterization, transesophageal echocardiogram and then perform electroanatomical mapping of the pulmonary venous atrium. The goal of this is to assess the best approach for placing an atrial pacing lead. If a site that is accessible from a left lateral thoracotomy is identified, then Dr. Urban Gibson will perform device and lead replacement. If an site that is accessible from a left lateral thoracotomy is not identified, but a site on the dome of the left atrium is found, the a trans venous lead will be placed via puncture through the left pulmonary artery. If an appropriate pacing site is not identified, then a generator replacement will be performed by Dr. Urban Gibson.

## 2023-12-10 NOTE — H&P
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 Possible treansvenous pacemeker    Subjective:   Ann Duran is a 48 y.o. female who was seen in clinic for pacemaker evaluation. He cardiac history is remarkable foir a diagnosis of L-transposition/ single ventricle. Her first cardiac surgery was a Modified RA to pA Fonatn procedure at age 34. She required a thoracic duct ligation after her Fontan and then reportedly did well until age 55 when she presented with a complaint of fatigue and was diagnosed with atrial arrhythmias. She then underwent a Fontan conversion in Jan 2009 at which time pacemaker leads were placed but the device was placed 2 weeks later after she developed bradycardia and fluid retention. The atrial lead was non functional and so she was VVI paced. She underwent attempted atrial lead revision with diaphragm plication in April of 2010, but the new atrial lead also failed to function. She was tried on VVI backup pacing but was eventually changed to VVIR at 70 bpm lower rate, which she has tolerated well since 2011, but has had increasing requirement for ventricular pacing.    Interval Events:  Ann Duran was last seen in clinic on in Dec. . Since then, she has been doing well overall.No further loss of capture loss of capture since her local EP increased her programmed RV outputs. All symptoms have since resolved. She denies any recent palpitations.         Past Medical History:  Past Medical History:   Diagnosis Date    Anxiety     controlled per pt    Cholelithiases     Noted on ultrasound, asymptomatic     Delayed emergence from general anesthesia 2010    pacemaker insertion, per patient    DORV (double outlet right ventricle)     GERD (gastroesophageal reflux disease)     well controlled w/med and diet    History of blood transfusion     HPV (human papilloma virus) infection     IUD (intrauterine device) in place     Mirena placed 12/27/10, replaced 12/2015, replaced November 16, 2016    Pacemaker     in abd    Post-operative nausea and vomiting     Sick sinus syndrome (HCC/RAF)      Social History:  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  Negative tobacco use.  Rare EtOH.    Review of Systems:  14 point review of system negative other than as stated above    Medications:  (Not in an outpatient encounter)      Allergies:  No Known Allergies      Objective:     Vitals:  BP 108/63  ~ Pulse 74  ~ Temp 36.4 ?C (97.5 ?F) (Temporal)  ~ Resp 18  ~ Ht 1.651 m (5' 5'')  ~ Wt 59.6 kg (131 lb 6.3 oz)  ~ SpO2 94%  ~ BMI 21.87 kg/m?  Facility age limit for growth %iles is 20 years. Facility age limit for growth %iles is 20 years. Facility age limit for growth %iles is 20 years.    General:   alert and no distress   Skin:   warm and dry, no  hyperpigmentation, vitiligo, or suspicious lesions   Nodes:   cervical, supraclavicular, and axillary nodes normal.   Eyes:   No conjunctival injection, sclerae anicteric   Oropharynx:  lips, mucosa, and tongue normal; teeth and gums normal   Neck:  no adenopathy, no carotid bruit, no JVD, supple, symmetrical, trachea midline and thyroid not enlarged, symmetric, no tenderness/mass/nodules   Lungs:   clear to auscultation bilaterally   Heart:   regular rate and rhythm, S1: single and S2: normal   Abdomen:   soft, non-tender; bowel sounds normal; no masses,  no organomegaly   Extremities:  extremities normal, atraumatic, no cyanosis or edema   Psychiatric:   normal mood, behavior, speech, dress, and thought processes     ECG today shows V pacing, and when pacing turned down, junctional rhythm at 57 bpm. No atrial activity present.    Assessment:   48 y.o. female with DORV status post Fontan conversion to extracardiac baffle on 11/21/2007, with subsequent placement of an epicardial pacemaker for sinus node dysfunction.  She had atrial lead failure shortly after initial placement.  In 2010 she underwent an unsuccessful attempt at placement of a new atrial lead.    She returns today formapping of the left atriumper our prior discussions she may also get a pacemaker via a puncture of the LPA.A TEE and R/L heart catheterization will also be performed.      Plan/ Recommendation:     Proceed with procedure.    Vania Rea. Carollee Herter M.D.  Pediatric Cardiology

## 2023-12-10 NOTE — H&P
 UPDATED H&P REQUIREMENT    For [facility] and [facility]    WHAT IS THE STATUS OF THE PATIENT'S MOST CURRENT HISTORY AND PHYSICAL?   - The most current H&P is >24 hours and <30 days, and having examined the patient, I attest that there have been no changes. (This suffices as an update to the H&P).      REFER TO MEDICAL STAFF POLICIES REGARDING PRE-PROCEDURE HISTORY AND PHYSICAL EXAMINATION AND UPDATED H&P REQUIREMENTS BELOW:    [facility] and [facility] and [facility] Medical Staff Policy 200 - For Patients Undergoing Procedures Requiring Moderate or Deep Sedation, General Anesthesia or Regional Anesthesia    Contents of a History and Physical Examination (H&P):    The H&P shall consist of chief complaint, history of present illness, allergies and medications, relevant social and family history, past medical history, review of systems and physical examination, and assessment and plan appropriate to the patient's age.    For Patients Undergoing Procedures Requiring Moderate or Deep Sedation, General Anesthesia or Regional Anesthesia:    1. An H&P shall be performed within 24 hours prior to the procedure by a qualified member of the medical staff or designee with appropriate privileges, except as noted in item 2 below.    2. If a complete history and physical was performed within thirty (30) calendar days prior to the patient's admission to the Medical Center for elective surgery, a member of the medical staff assumes the responsibility for the accuracy of the clinical information and will need to document in the medical record within twenty-four (24) hours of admission and prior to surgery or major invasive procedure, that they either attest that the history and physical has been reviewed and accepted, or document an update of the original history and physical relevant to the patient's current  clinical status.    3. Providing an H&P for patients undergoing surgery under local anesthesia is at the discretion of the Attending Physician.     4. When a procedure is performed by a dentist, podiatrist or other practitioner who is not privileged to perform an H&P, the anesthesiologist's assessment immediately prior to the procedure will constitute the 24 hour re-assessment.The dentist, podiatrist or other practitioner who is not privileged to perform an H&P will document the history and physical relevant to the procedure.    5. If the H&P and the written informed consent for the surgery or procedure are not recorded in the patient's medical record prior to surgery, the operation shall not be performed unless the attending physician states in writing that such a delay could lead to an adverse event or irreversible damage to the patient.    6. The above requirements shall not preclude the rendering of emergency medical or surgical care to a patient in dire circumstances.

## 2023-12-10 NOTE — Brief Op Note
Brief Operative/Procedure Note    Patient: Ann Duran    Date of Operation(s)/Procedure(s): 12/10/2023    Pre-op Diagnosis: SICK SINUS SYNDROME/ DORV TGA/ FONTAN CONVERSION       Post-op Diagnosis: Sick sinus syndrome and complete heart block    Operation(s)/Procedure(s):  EP STUDY TRANS-BAFFLE PUNCTURE WITH MAPPING OF PULMONARY VENOUS ATRIUM  ECHOCARDIOGRAPHY TRANSESOPHAGEAL    Surgeons and Role:     Devota Pace., MD - Primary  *Daneil Dolin, MD - assisted in the absence of a qualified resident    Anesthesia Staff and Role:  Anesthesia Resident: Lorelle Gibbs., MD  Anesthesiologist: Sopher, Nolon Bussing., MD    Anesthesia Type:   General    Pre-Op Medications: See EMR    Intra-op Medications: (Antibiotics, Anticoagulants, Immunosuppressants)  * Intraprocedure medication information is unavailable because the case start and end events have not been set *    Fluids: See anesthesia record    Estimated Blood Loss: less than 50 mL    Findings:   Isolated atrial signal in localized anterior site (inferior near AVV annulus)- otherwise, no atrial capture with high-output pacing   Sinus node dysfunction with no AV conduction  Mapping performed with 6Fr CS catheter via trans-baffle puncture - RF VersaCross unsuccessful, required BRK needle and balloon dilation of transseptal connection - unable to advance 8Fr mapping catheter or sheath into common/pulmonary venous atrium     Complications: None; patient tolerated the operation(s)/procedure(s) well.    Access:  RFV - 8.5 Fr (Vascade MVP)  No other access sites         Staff and Role:   Circulating Nurse: Jiles Garter, RN  Radiology Technologist: Aline August  Scrub Person: Virgina Organ  Monitor Tech: Mervin Hack.    Rayya Yagi A. Darcey Nora, MD  Date: 12/10/2023  Time: 11:47 AM

## 2023-12-10 NOTE — Discharge Instructions
WHAT YOU SHOULD KNOW AFTER YOUR ELECTROPHYSIOLOGIC PROCEDURE    The Band aids on the groin area should be left on for 24 hours.  The band aids may be removed after 24 hours and replaced with an ordinary band aid until a good scab has formed (usually this takes about 5 days).  No baths, or swimming until a good scab forms, since soaking in water could cause bacteria to enter the skin through the puncture site and cause an infection.  Sponge baths or very brief showers are fine.    Slight bleeding or oozing may occur from the catheter sites.  If this happens, you should hold pressure over the area for 10 full minutes and notify your doctor.  If redness, swelling, or pain occurs at the catheter sites, your regular doctor should check you.     If the fast heart rate returns, you should be seen as soon as possible by your regular doctor or in the emergency room so that an electrocardiogram can be done to check the heart rhythm.    You are allowed to return to light activity  In 3 days.  Physical activity should be limited for 1 week to allow for complete wound healing.    In 1 week you should see your regular doctor or cardiologist so the catheter sites can be checked.  A 12-lead EKG (electrocardiogram) should also be done, and a copy sent to your [facility] Cardiologist (fax number [phone]).    In 1-2 months you should have an appointment in the [facility] Pediatric Cardiology Arrhythmia Clinic.  You can schedule the appointment when you get home by calling the [facility] Pediatric Cardiology office @ [phone].    Be sure to notify your [facility] Cardiologist if any problems occur.  If the problem is urgent, call the [facility] operator @ [phone] and ask for the Pediatric Cardiology Fellow On-Call.

## 2023-12-12 ENCOUNTER — Telehealth: Payer: BLUE CROSS/BLUE SHIELD

## 2023-12-12 NOTE — Telephone Encounter
Patient Phone Messages:     MD office returned call    Patient returned call   X Patient called with questions regarding surgery     Comments:    Pt called stating she has not heard from anyone about her check in time for tomorrow & also had no idea who her doctor is. I told her the name of her assigned surgeon & provided their office number. I also transferred her directly to them & told her I would pass along a courtesy  message as well so it is documented.

## 2023-12-13 ENCOUNTER — Ambulatory Visit: Payer: BLUE CROSS/BLUE SHIELD

## 2023-12-13 LAB — Pregnancy Test,Urine: PREGNANCY TEST,URINE: NEGATIVE

## 2023-12-13 MED ORDER — ASPIRIN 81 MG PO TBEC
81 mg | ORAL_TABLET | Freq: Every day | ORAL | 3 refills | Status: AC
Start: 2023-12-13 — End: ?

## 2023-12-13 MED ORDER — OXYCODONE HCL 5 MG PO TABS
5 mg | ORAL_TABLET | Freq: Four times a day (QID) | ORAL | 0 refills | 4.00 days | Status: AC | PRN
Start: 2023-12-13 — End: ?
  Filled 2023-12-14 (×2): qty 16, 4d supply, fill #0

## 2023-12-13 MED ORDER — CEPHALEXIN 250 MG PO CAPS
500 mg | ORAL_CAPSULE | Freq: Four times a day (QID) | ORAL | 0 refills | Status: AC
Start: 2023-12-13 — End: 2023-12-14

## 2023-12-13 MED ORDER — CEPHALEXIN 500 MG PO CAPS
500 mg | ORAL_CAPSULE | Freq: Four times a day (QID) | ORAL | 0 refills | 7.00 days | Status: AC
Start: 2023-12-13 — End: ?
  Filled 2023-12-14 (×2): qty 28, 7d supply, fill #0

## 2023-12-13 MED ORDER — ACETAMINOPHEN 325 MG PO TABS
650 mg | ORAL_TABLET | Freq: Four times a day (QID) | ORAL | 0 refills | Status: AC | PRN
Start: 2023-12-13 — End: ?

## 2023-12-13 MED ORDER — OXYCODONE HCL 5 MG PO TABS
5 mg | ORAL_TABLET | Freq: Four times a day (QID) | ORAL | 0 refills | Status: AC | PRN
Start: 2023-12-13 — End: 2023-12-14

## 2023-12-13 MED ADMIN — FENTANYL CITRATE (PF) 100 MCG/2ML IJ SOLN: INTRAVENOUS | @ 18:00:00 | Stop: 2023-12-13 | NDC 00409909422

## 2023-12-13 MED ADMIN — FENTANYL CITRATE (PF) 100 MCG/2ML IJ SOLN: INTRAVENOUS | @ 16:00:00 | Stop: 2023-12-13 | NDC 00409909422

## 2023-12-13 MED ADMIN — SUGAMMADEX SODIUM 200 MG/2ML IV SOLN: INTRAVENOUS | @ 18:00:00 | Stop: 2023-12-13 | NDC 00006542312

## 2023-12-13 MED ADMIN — SODIUM CHLORIDE 0.9 % IR SOLN: @ 18:00:00 | Stop: 2023-12-14 | NDC 00338004804

## 2023-12-13 MED ADMIN — PHENYLEPHRINE HCL 10 MG/ML IV SOLN (ANES): INTRAVENOUS | @ 16:00:00 | Stop: 2023-12-13

## 2023-12-13 MED ADMIN — BUPIVACAINE HCL (PF) 0.25 % IJ SOLN: @ 18:00:00 | Stop: 2023-12-14 | NDC 63323046417

## 2023-12-13 MED ADMIN — FENTANYL CITRATE (PF) 100 MCG/2ML IJ SOLN: INTRAVENOUS | @ 17:00:00 | Stop: 2023-12-13 | NDC 00409909422

## 2023-12-13 MED ADMIN — MIDAZOLAM HCL 10 MG/10ML IJ SOLN: INTRAVENOUS | @ 16:00:00 | Stop: 2023-12-13 | NDC 00409258705

## 2023-12-13 MED ADMIN — PLASMA-LYTE-A PH 7.4 IV SOLN: INTRAVENOUS | @ 16:00:00 | Stop: 2023-12-13 | NDC 00338022104

## 2023-12-13 MED ADMIN — LIDOCAINE HCL (PF) 1 % IJ SOLN: .4 mL | INTRADERMAL | @ 14:00:00 | Stop: 2023-12-13 | NDC 63323049257

## 2023-12-13 MED ADMIN — PROPOFOL 200 MG/20ML IV EMUL: INTRAVENOUS | @ 16:00:00 | Stop: 2023-12-13 | NDC 63323026929

## 2023-12-13 MED ADMIN — ROCURONIUM BROMIDE 50 MG/5ML IV SOLN: INTRAVENOUS | @ 16:00:00 | Stop: 2023-12-13 | NDC 39822420002

## 2023-12-13 MED ADMIN — DEXAMETHASONE SODIUM PHOSPHATE 4 MG/ML IJ SOLN: INTRAVENOUS | @ 16:00:00 | Stop: 2023-12-13 | NDC 67457042312

## 2023-12-13 MED ADMIN — ACETAMINOPHEN 10 MG/ML IV SOLN: INTRAVENOUS | @ 17:00:00 | Stop: 2023-12-13 | NDC 00264410090

## 2023-12-13 MED ADMIN — PROPOFOL 200 MG/20ML IV EMUL: INTRAVENOUS | @ 17:00:00 | Stop: 2023-12-13 | NDC 63323026929

## 2023-12-13 MED ADMIN — FAMOTIDINE (PF) 20 MG/2ML IV SOLN: INTRAVENOUS | @ 17:00:00 | Stop: 2023-12-13 | NDC 67457043322

## 2023-12-13 MED ADMIN — CEFAZOLIN SODIUM 1 G IJ SOLR: INTRAVENOUS | @ 16:00:00 | Stop: 2023-12-13 | NDC 00143992490

## 2023-12-13 MED ADMIN — ONDANSETRON HCL 4 MG/2ML IJ SOLN: INTRAVENOUS | @ 17:00:00 | Stop: 2023-12-13 | NDC 60505613005

## 2023-12-13 MED ADMIN — PHENYLEPHRINE HCL (PRESSORS) 10 MG/ML IV SOLN: INTRAVENOUS | @ 16:00:00 | Stop: 2023-12-13 | NDC 23155062041

## 2023-12-13 MED ADMIN — VANCOMYCIN HCL 1000 MG TOPICAL: @ 18:00:00 | Stop: 2023-12-14

## 2023-12-13 MED ADMIN — CEFAZOLIN SODIUM 1 G IJ SOLR: INTRAVENOUS | @ 18:00:00 | Stop: 2023-12-13 | NDC 00143992490

## 2023-12-13 MED ADMIN — LIDOCAINE HCL (CARDIAC) 100 MG/5ML IV SOSY: INTRAVENOUS | @ 16:00:00 | Stop: 2023-12-13 | NDC 76329339001

## 2023-12-13 NOTE — Discharge Instructions
DIET:  Please resume your regular diet, as tolerated.    MEDICATION:  Please resume your preoperative medications as scheduled.  You may resume your aspirin on 7 days 12/20/23.  Take antibiotic and pain medications as prescribed.  Stop taking pain medicine if experience altered mental status or extreme sleepiness. Call 911 in case of altered mental status.      Keflex 500mg  by mouth 4 times a a day for 7days were electronically prescribed to [facility] to be picked up prior to being discharged.   Oxycodone 5mg  by mouth every 6 hours for Severe pain as needed has been added as requested.  Tylenol has been recommended for pain control. Do not exceed the maximum tylenol daily dose of 2000mg  per 24hrs if taking other medications that contain tylenol.    WOUND CARE:  Please keep your wound vacuum dressing on. Plug in Vacuum Device at night with cord provided. If Vacuum dressing comes off, please cover incision with sterile dressing provided;  Steri-strips on the skin will remain for 1-2 weeks before falling off. If the steri-strips start to separate and peel off from the skin or get soiled, you can remove them. Please keep your incision clean and dry.  Do not rub ointments or lotions on the wound.  Monitor your wound twice a day, to assure there are no signs of infection or poor wound healing.    BATH/SHOWERS:  Ok take a sponge bath following your procedure, AVOID going near your dressing or getting your dressing wet.  After 7 days, you can start taking short showers.  Use mild soap to clean around incision and pat dry. Face away from shower head to minimize excess wetting. Please do not take a bath, swim or submerge your incision for 4 weeks.     ACTIVITIES:  Do not lift anything heavier than 10 lbs for 6 weeks.  You may not to return to work or school until cleared by a physician at a follow up appointment.  Please not to drive until cleared by a physician at your follow up appointment. SYMPTOMS TO MONITOR AND REPORT URGENTLY TO YOUR PHYSICIAN:  Please call Dr. Urban Gibson 's office at [phone] if you experience any of the following:  Chest pain, palpitations, nausea, vomiting, difficulty breathing, weight gain, leg swelling, or pain that is uncontrolled, along with fever, redness or drainage from or around the incision.    FOLLOW UP APPOINTMENT:  Please call Dr. Chales Abrahams office at [phone] for a follow up appointment, Next Wednesday 12/18/23.  Please follow up with your primary care physician and cardiologist as previously scheduled.        Discharge Instructions: After Your Surgery  You?ve just had surgery. During surgery, you were given medicine called anesthesia to keep you relaxed and free of pain. After surgery, you may have some pain or nausea. This is common. Here are some tips for feeling better and getting well after surgery.   Going home  Your healthcare provider will show you how to take care of yourself when you go home. They'll also answer your questions. Have an adult family member or friend drive you home. For the first 24 hours after your surgery:   Don't drive or use heavy equipment.  Don't make important decisions or sign legal papers.  Take medicines as directed.  Don't drink alcohol.  Have someone stay with you, if needed. They can watch for problems and help keep you safe.  Be sure to go  to all follow-up visits with your healthcare provider. And rest after your surgery for as long as your provider tells you to.   Coping with pain  If you have pain after surgery, pain medicine will help you feel better. Take it as directed, before pain becomes severe. Also, ask your healthcare provider or pharmacist about other ways to control pain. This might be with heat, ice, or relaxation. And follow any other instructions your surgeon or nurse gives you.      Stay on schedule with your medicine.     Tips for taking pain medicine  To get the best relief possible, remember these points:   Pain medicines can upset your stomach. Taking them with a little food may help.  Most pain relievers taken by mouth need at least 20 to 30 minutes to start to work.  Don't wait till your pain becomes severe before you take your medicine. Try to time your medicine so that you can take it before starting an activity. This might be before you get dressed, go for a walk, or sit down for dinner.  Constipation is a common side effect of some pain medicines. Call your healthcare provider before taking any medicines such as laxatives or stool softeners to help ease constipation. Also ask if you should skip any foods. Drinking lots of fluids and eating foods such as fruits and vegetables that are high in fiber can also help. Remember, don't take laxatives unless your surgeon has prescribed them.  Drinking alcohol and taking pain medicine can cause dizziness and slow your breathing. It can even be deadly. Don't drink alcohol while taking pain medicine.  Pain medicine can make you react more slowly to things. Don't drive or run machinery while taking pain medicine.  Your healthcare provider may tell you to take acetaminophen to help ease your pain. Ask them how much you're supposed to take each day. Acetaminophen or other pain relievers may interact with your prescription medicines or other over-the-counter (OTC) medicines. Some prescription medicines have acetaminophen and other ingredients in them. Using both prescription and OTC acetaminophen for pain can cause you to accidentally overdose. Read the labels on your OTC medicines with care. This will help you to clearly know the list of ingredients, how much to take, and any warnings. It may also help you not take too much acetaminophen. If you have questions or don't understand the information, ask your pharmacist or healthcare provider to explain it to you before you take the OTC medicine.   Managing nausea  Some people have an upset stomach (nausea) after surgery. This is often because of anesthesia, pain, or pain medicine, less movement of food in the stomach, or the stress of surgery. These tips will help you handle nausea and eat healthy foods as you get better. If you were on a special food plan before surgery, ask your healthcare provider if you should follow it while you get better. Check with your provider on how your eating should progress. It may depend on the surgery you had. These general tips may help:   Don't push yourself to eat. Your body will tell you when to eat and how much.  Start off with clear liquids and soup. They're easier to digest.  Next try semi-solid foods as you feel ready. These include mashed potatoes, applesauce, and gelatin.  Slowly move to solid foods. Don?t eat fatty, rich, or spicy foods at first.  Don't force yourself to have 3 large meals a day. Instead  eat smaller amounts more often.  Take pain medicines with a small amount of solid food, such as crackers or toast. This helps prevent nausea.  When to call your healthcare provider  Call your healthcare provider right away if you have any of these:   You still have too much pain, or the pain gets worse, after taking the medicine. The medicine may not be strong enough. Or there may be a complication from the surgery.  You feel too sleepy, dizzy, or groggy. The medicine may be too strong.  Side effects such as nausea or vomiting. Your healthcare provider may advise taking other medicines to .  Skin changes such as rash, itching, or hives. This may mean you have an allergic reaction. Your provider may advise taking other medicines.  The incision looks different (for instance, part of it opens up).  Bleeding or fluid leaking from the incision site, and weren't told to expect that.  Fever of 100.4?F (38?C) or higher, or as directed by your provider.  Call 911  Call 911 right away if you have:   Trouble breathing  Facial swelling    If you have obstructive sleep apnea   You were given anesthesia medicine during surgery to keep you comfortable and free of pain. After surgery, you may have more apnea spells because of this medicine and other medicines you were given. The spells may last longer than normal.    At home:  Keep using the continuous positive airway pressure (CPAP) device when you sleep. Unless your healthcare provider tells you not to, use it when you sleep, day or night. CPAP is a common device used to treat obstructive sleep apnea.  Talk with your provider before taking any pain medicine, muscle relaxants, or sedatives. Your provider will tell you about the possible dangers of taking these medicines.  Contact your provider if your sleeping changes a lot even when taking medicines as directed.  [employer] last reviewed this educational content on 08/05/2020  ? 2000-2023 [employer], [place]. All rights reserved. This information is not intended as a substitute for professional medical care. Always follow your healthcare professional's instructions.

## 2023-12-13 NOTE — Op Note
Ann Duran, DIRECTV D.  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, sick sinus syndrome s/p VVI pacemaker, now with Generator End of Life     Post-op Diagnosis: Same     Operation(s)/Procedure(s):  ABDOMINAL PACEMAKER GENERATOR CHANGE  Revision of pacemaker pocket  30cc of 0.25% Marcaine injected in wound bed  Prevena Wound Vac Dressing     Surgeons and Role:     * Archie Atilano, Dorris Carnes., MD - Primary     Hilarie Fredrickson NP Assistant     Anesthesia Staff and Role:  Anesthesia Resident: Bailey Mech., MD  Anesthesiologist: Barbie Haggis., MD, PhD     Anesthesia Type:   General     Pre-Op Medications: Ancef     Intra-op Medications: (Antibiotics, Anticoagulants, Immunosuppressants)      Administrations occurring from 0730 to 1115 on 12/13/23:   Medication Name Total Dose   BUPivacaine PF 0.25% inj 30 mL   vancomycin topical powder 1,000 mg   sodium chloride 0.9% irrigation soln 1,000 mL      Indications:  Battery end of life  Findings:  Right-sided retro rectus generator pocket  Description:   After obtaining informed consent from the parents, the patient was identified with 2 patient identifiers and prepped and draped in standard sterile fashion.  Extended safety sign-in and time-out was performed.  30 cc of 0.25% Marcaine was instilled around the site of incision.  An incision was made in the existing lower part of the sternotomy scar using scalpel and deepened through the subcutaneous tissues using electrocautery.  It was noted that the generator appeared to be behind the right rectus abdominis on CT scan imaging.  Therefore the linea alba was incised towards the right side to elevate the rectus abdominis muscle and exposed the generator pocket between the posterior rectus sheath and the right-sided rectus abdominis muscle.  The device was delivered into the wound the leads disconnected and quickly reconnected to the new generator as the patient was completely pacemaker dependent and had minimal escape rhythm.  The atrial lead was tested prior to connecting and there was no conduction despite the lead thresholds being adequate.  Revision of pacemaker pocket:    The device was slightly larger than the prior one and therefore the pacemaker pocket capsule was incised with cautery anteriorly in all directions as well as laterally to enlarge the space.  Hemostasis was confirmed.  Patient had significant ascites and therefore we took great care to not enter the abdominal compartment.  The new generator was now coated with 1 g of vancomycin paste and then placed inside the pocket.  The incision was closed in layers using 0 Vicryl figure-of-eight interrupted sutures for the linea alba and rectus abdominis muscle, 2-0 Vicryl interrupted figure-of-eight sutures for the subcutaneous tissues, and 3-0 Monocryl subdermal layer as well as 4-0 Monocryl subcuticular skin closure.  The wound was cleaned and Mastisol on Steri-Strips applied to it.  Prevena wound VAC dressing was placed over it.    The sponge instrument and needle count was correct at the end of the procedure.  I attest that I was the surgeon and performed all parts of this case and was available for immediate aftercare.   I attest that there was no Sales promotion account executive and that is why Albin Felling  Was my Geophysicist/field seismologist.  The patient tolerated the procedure well as was  transferred to the PACU in paced rhythm, extubated on no drips.     ATTESTATION:   Since the patient was entirely pacemaker dependent this generator exchange required considerable speed and technical expertise as the device was behind the rectus abdominis muscle.  This added to the challenges of this case.    Blood Products: None     Fluids: See MAR     Estimated Blood Loss: Minimal     Findings: As Expected     Complications: None; patient tolerated the operation(s)/procedure(s) well.                 Specimens:   ID Type Source Tests Collected by Time   1 : Explanted pacemaker generator Foreign Body/Substance Other, Enter source information TISSUE EXAM/FOREIGN BODY (AP) Marylynn Pearson., MD 12/13/2023 0913         IMPLANTED DEVICES  IPG [id number] Jamse Mead DR MRI    Model # Serial # Manufacturer Implant Date Implant Site Status Is Primary   [id number] Medtronic 12/13/2023 Abdominal Implanted Yes   LEADS AND ADAPTERS  Chamber Model # Length Serial # Manufacturer Implant Date Implant Site Status   Right Atrium [id number] R Medtronic 12/05/2007 Epi-RA In Use   Right Ventricle [id number] R Medtronic 12/05/2007 Epi-Ant Lat RV In Use   REMOVED DEVICE INFORMATION  Status Type Product # Product Name Serial # Manufacturer Implant Date   Explanted Pacemaker [id number] IPG [id number] Jamse Mead DR MRI [facility] [place] [id number] H Medtronic 12/12/2017   STIMULATION THRESHOLD  Not Applicable.    IVCD (ms): 0 Diaphragmatic Stimulation:    First Loss of Capture:  Retrograde Conduction (ms): 0     DEVICE MEASURED DATA  Note: Impedance given in Ohms  Pacing Mode: VVIR    Mode Switch: On Lower Rate:70  Upper Tracking Rate:130 Upper Activity Rate: 130    Paced A-V Interval:180 Sensed A-V Interval:150  Rate Adaptive A-V Interval:Off      Ventricular Pacing:  V to V Interval: N/A    Ventricular Sense Off

## 2023-12-13 NOTE — H&P
UPDATED H&P REQUIREMENT    For Ardyth Harps [facility] and [facility] and [facility]    WHAT IS THE STATUS OF THE PATIENT'S MOST CURRENT HISTORY AND PHYSICAL?   - Changes to the most current H&P, which is >24 hours and <30 days, have been noted in the progress note written by Dr. Charlesetta Garibaldi dated 12/10/23. (This suffices as an update to the H&P).     REFER TO MEDICAL STAFF POLICIES REGARDING PRE-PROCEDURE HISTORY AND PHYSICAL EXAMINATION AND UPDATED H&P REQUIREMENTS BELOW:    Ardyth Harps [facility] and [facility] and [facility] Medical Staff Policy 200 - For Patients Undergoing Procedures Requiring Moderate or Deep Sedation, General Anesthesia or Regional Anesthesia    Contents of a History and Physical Examination (H&P):    The H&P shall consist of chief complaint, history of present illness, allergies and medications, relevant social and family history, past medical history, review of systems and physical examination, and assessment and plan appropriate to the patient's age.    For Patients Undergoing Procedures Requiring Moderate or Deep Sedation, General Anesthesia or Regional Anesthesia:    1. An H&P shall be performed within 24 hours prior to the procedure by a qualified member of the medical staff or designee with appropriate privileges, except as noted in item 2 below.    2. If a complete history and physical was performed within thirty (30) calendar days prior to the patient?s admission to the Medical Center for elective surgery, a member of the medical staff assumes the responsibility for the accuracy of the clinical information and will need to document in the medical record within twenty-four (24) hours of admission and prior to surgery or major invasive procedure, that they either attest that the history and physical has been reviewed and accepted, or document an update of the original history and physical relevant to the patient's current clinical status.    3. Providing an H&P for patients undergoing surgery under local anesthesia is at the discretion of the Attending Physician.     4. When a procedure is performed by a dentist, podiatrist or other practitioner who is not privileged to perform an H&P, the anesthesiologist?s assessment immediately prior to the procedure will constitute the 24 hour re-assessment.The dentist, podiatrist or other practitioner who is not privileged to perform an H&P will document the history and physical relevant to the procedure.    5. If the H&P and the written informed consent for the surgery or procedure are not recorded in the patient's medical record prior to surgery, the operation shall not be performed unless the attending physician states in writing that such a delay could lead to an adverse event or irreversible damage to the patient.    6. The above requirements shall not preclude the rendering of emergency medical or surgical care to a patient in dire circumstances.

## 2023-12-13 NOTE — Brief Op Note
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, sick sinus syndrome s/p VVI pacemaker, now with Generator End of Life     Post-op Diagnosis: Same    Operation(s)/Procedure(s):  ABDOMINAL PACEMAKER GENERATOR CHANGE  30cc of 0.25% Marcaine injected in wound bed  Provena Wound Vac Dressing    Surgeons and Role:     * Biniwale, Dorris Carnes., MD - Primary     Hilarie Fredrickson NP Assistant    Anesthesia Staff and Role:  Anesthesia Resident: Bailey Mech., MD  Anesthesiologist: Barbie Haggis., MD, PhD    Anesthesia Type:   General    Pre-Op Medications: Ancef    Intra-op Medications: (Antibiotics, Anticoagulants, Immunosuppressants)  Administrations occurring from 0730 to 1115 on 12/13/23:   Medication Name Total Dose   BUPivacaine PF 0.25% inj 30 mL   vancomycin topical powder 1,000 mg   sodium chloride 0.9% irrigation soln 1,000 mL       Blood Products: None    Fluids: See MAR    Estimated Blood Loss: Minimal    Findings: As Expected    Complications: None; patient tolerated the operation(s)/procedure(s) well.                 Specimens:   ID Type Source Tests Collected by Time   1 : Explanted pacemaker generator Foreign Body/Substance Other, Enter source information TISSUE EXAM/FOREIGN BODY (AP) Biniwale, Reshma M., MD 12/13/2023 [phone]       Drains:   Urethral Catheter Temperature probe 16 Fr. (Active)            Staff and Role:   Circulating Nurse: Thresa Ross, RN; Dela Elijah Birk, Nash Dimmer, RN  Nurse Practitioner: Neila Gear., NP  Scrub Person: Emelia Salisbury  Chaperone: Orie Fisherman Jonathon Resides, NP    Date: 12/13/2023  Time: 9:44 AM

## 2023-12-15 NOTE — Op Note
[facility]  DATE OF PROCEDURE: 12-10-2023    PATIENT: Ann Duran  MRN: [id number]  DATE OF BIRTH: 19-Dec-1975    REFERRING PRACTITIONER: Devota Pace., MD  PRIMARY CARE PROVIDER: Lilla Shook, DO    ATTENDING PHYSICIAN: Vania Rea. Carollee Herter, MD    PROCEDURE PERFORMED:    1. Comprehensive electrophysiology evaluation(93620)   2. Left heart catheterization by transseptal puncture (+[id number])   3. Balloon dilatation of atrial septum    4. Electroanatomical mapping (21308)   5. Ultrasound guided vascular access (65784)   6. Right heart catheterization congenital (69629)    PRE-PROCEDURE DIAGNOSIS:    1. Double inlet left ventricle  2. Status post Fontan procedure with revision to extracardiac Fontan  3. Status post Maze procedure  4. Status post Pacemaker with non functioning atrial lead  5. Pacemaker at [facility]    POST-PROCEDURE DIAGNOSIS:    1. Double inlet left ventricle  2. Status post Fontan procedure with revision to extracardiac Fontan  3. Status post Maze procedure  4. Status post Pacemaker with non functioning atrial lead  5. Pacemaker at [facility]  6. Complete heart block  7. Extensive scarring of atrium with no accessible pacing sites.    PERFORMING PHYSICIAN: Vania Rea. Carollee Herter, MD    PATIENT HISTORY: The patient is a 48 y.o. with a history of Fontan physiology and pacemaker for sick sinus syndrome. Her existing atrial lead is non functional and she is undergoing mapping of her atrium prior to planned left thoracotomy with placemant of an atrial lead.    METHOD: The patient was brought to the electrophysiology laboratory in the fasting state. General anesthesia was administered by the anesthesiologist (see separate anesthesia note). The patient was prepped and draped in the usual sterile fashion.  The modified Seldinger technique was used to place a sheath in the right femoral vein (8.5 Fr). Vascular ultrasound was used to assist with the sheath placement to minimize the chances of vessel trauma. A right heart catheterization was then performed with a 7 F wedge catheter. A 5 F infinity pigtail catheter was then used to perform a contrast injection into the Fontan baffle at the junction with the SVC. A transseptal puncture was then performed under TEE guidance with extreme difficulty. Attempts to cross the baffle with RF energy were unsuccessful and so a transseptal needle was used. This resulted in the needle and tip of the dilator crossing the baffle but the sheath did not advance. A Toray wire did cross but the sheath could not be advanced of that wire either. A superstiff wire was also used without success. The dilator was advanced successfully without the sheath but larger dilators would not advanve. A 3 mm coronary balloon was placed and fully inflated without a waist. A 4mm balloon would inflate but with up to 25 atm the waist did not resolve. At that point a coronary wire was placed and used to hold position while that ablation catheter was advanced but that mapping catheter would not cross the septum. A 6 F decapolar catheter did cross and that was used to create an electroanatomical map. Only a small rim of atrial tissue at the anterior edge of the right sided AV valve annulus had tissue with electrical activity. This rate of the atrial tissue was lower then the ventricular pacing rate (VA block). That tissue could be paced but there was no AV conduction. All catheters and sheaths were then removed and hemostasis achieved with direct pressure.  The [employer] electroanatomical mapping system was used during the procedure.    Site Sys Dias Mean A Wave V Wave   IVC   19     Fontan Baffle   19     LPA 21 19 19      LPAW   13 14 17    SVC   20     RPA 22 20 21      RPAW   14 13 18      ANGIOGRAPHY  A single 40 cc contrast injection was performed in the Fontan baffle at the IVC anastomosis. The Fontan baffle was widely patent with flow to both pulmonary arteries. Levophase demonstrated normal pulmonary transit time with no evidence of pulmonary venous obstruction and a very dilated pulmonary venous atrium.    COMPLICATIONS: None     ESTIMATED BLOOD LOSS: Nil    CONTRAST: 0 mL    FLUOROSCOPY TIME: 0 minutes    FINAL DIAGNOSES:    1. Electrically silent atrium with only a small rim of tissue that could be paced.    2. Complete heart block    DISPOSITION: The patient will be observed in the PACU and then in the PTU. She will be given Tylenol as needed for pain and Ondansetron for nausea. She will be discharged to home and return in 3 days for pacemaker generator replacement without attempts at lead revision by Dr. Urban Gibson.    High Complexity: This procedure was complicated by extensive attempts to cross the Fontan baffle using RF energy,  a transseptal needle and multiple coronary balloons/ wires/ dilators.    Author: Vania Rea. Carollee Herter, MD 12/15/2023 8:20 AM  Pediatric Electrophysiologist      SNAPSHOTS:  Electoanatimical maps and CT scan volume

## 2023-12-16 LAB — Tissue Exam

## 2023-12-17 NOTE — Progress Notes
[facility]  DIVISION OF CARDIAC SURGERY  CARDIOTHORACIC SURGEON: Lesly Rubenstein, M.D.  CARDIOLOGIST: Gaylyn Lambert, M.D., Charlesetta Garibaldi, M.D.  PRIMARY CARE PHYSICIAN: Lilla Shook, D.O.         FOLLOW-UP NOTE  Ann Duran is a 48 y.o. 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       On 12/10/23, Ann Duran underwent the following operation performed by Lesly Rubenstein, M.D.:  Operation(s)/Procedure(s):  ABDOMINAL PACEMAKER GENERATOR CHANGE  Revision of pacemaker pocket  30cc of 0.25% Marcaine injected in wound bed  Prevena Wound Vac Dressing    Indications:  Battery end of life  Findings:  Right-sided retro rectus generator pocket    12/17/2023: Patient returns today for post-operative follow-up.   He/She denies pain, tolerates regular meals, has regular bowel movements. Patient notes improvement in sleep. Denies lightheadedness, dizziness, shortness of breath at rest or with exertion, lower leg edema, fatigue, orthopnea, PND, palpitations, syncopal episode or chest pain.       Active Ambulatory Problems     Diagnosis Date Noted    CIN II (cervical intraepithelial neoplasia II) 04/22/2012    Functional single ventricle 04/28/2013    DORV (double outlet right ventricle) 04/28/2013    S/P Fontan procedure 04/28/2013    Presence of permanent cardiac pacemaker 04/28/2013    Pacemaker lead malfunction 10/05/2023     Resolved Ambulatory Problems     Diagnosis Date Noted    No Resolved Ambulatory Problems     Past Medical History:   Diagnosis Date    Anxiety     Cholelithiases     Delayed emergence from general anesthesia 2010    GERD (gastroesophageal reflux disease)     History of blood transfusion     HPV (human papilloma virus) infection     IUD (intrauterine device) in place     Pacemaker     Post-operative nausea and vomiting Sick sinus syndrome (HCC/RAF)      Current Outpatient Medications   Medication Sig    acetaminophen 325 mg tablet Take 2 tablets (650 mg total) by mouth every six (6) hours as needed for Pain.    amoxicillin 500 mg tablet     Ascorbic Acid (VITAMIN C) 1000 MG tablet Take 1 tablet (1,000 mg total) by mouth as needed for.    [START ON 12/20/2023] aspirin 81 mg EC tablet Take 1 tablet (81 mg total) by mouth daily.    cephalexin 500 mg capsule Take 1 capsule (500 mg total) by mouth four (4) times daily.    Cholecalciferol (VITAMIN D) 125 MCG (5000 UT) CAPS Take by mouth.    citalopram 20 mg tablet Take 1 tablet (20 mg total) by mouth.    clonazePAM 0.5 mg tablet TAKE 1 TABLET(0.5 MG) BY MOUTH AT NIGHT AS NEEDED FOR INSOMNIA    cyanocobalamin 1000 mcg tablet Take 1 tablet (1,000 mcg total) by mouth as needed for.    fluticasone 50 mcg/act nasal spray     furosemide 20 mg tablet Take 3 tablets (60 mg total) by mouth daily.    furosemide 80 mg tablet Please take Furosemide 80mg  in the morning and 40mg  in the afternoon. (Patient not taking: Reported on 07/17/2023)    levonorgestrel (MIRENA, 52 MG,) 20 mcg/day IUD by Intrauterine route.    lisinopril 2.5 mg tablet Take 1 tablet (  2.5 mg total) by mouth daily.    [EXPIRED] oxyCODONE 5 mg tablet Take 1 tablet (5 mg total) by mouth every six (6) hours as needed for Severe Pain (Pain Scale 7-10). Max Daily Amount: 20 mg    PANTOPRAZOLE 20 mg DR tablet TAKE 1 TABLET BY MOUTH DAILY AS  NEEDED    potassium chloride 10 MEQ tablet Take 1 tablet (10 mEq total) by mouth daily.    sildenafil 20 mg tablet Take 1 tablet (20 mg total) by mouth three (3) times daily.    spironolactone 50 mg tablet Take 3 tablets (150 mg total) by mouth daily.    valacyclovir 500 mg tablet ValACYclovir HCl - 500 MG Oral Tablet    zolpidem 5 mg tablet Take 0.5 tablets (2.5 mg total) by mouth at bedtime as needed for Sleep.    [DISCONTINUED] acetaminophen 325 mg tablet Take 2 tablets (650 mg total) by mouth. [DISCONTINUED] aspirin 81 mg EC tablet Take 1 tablet (81 mg total) by mouth daily.    [DISCONTINUED] cephalexin 250 mg capsule Take 2 capsules (500 mg total) by mouth four (4) times daily for 7 days.    [DISCONTINUED] oxyCODONE 5 mg tablet Take 1 tablet (5 mg total) by mouth every six (6) hours as needed for Severe Pain (Pain Scale 7-10). Max Daily Amount: 20 mg     No current facility-administered medications for this visit.     Facility-Administered Medications Ordered in Other Visits   Medication Dose Route Frequency    [COMPLETED] lidocaine PF 1% inj 0.4 mL  0.4 mL Intradermal Once    [DISCONTINUED] acetaminophen IV inj   Intravenous PRN    [DISCONTINUED] BUPivacaine PF 0.25% inj    PRN    [DISCONTINUED] ceFAZolin inj   Intravenous PRN    [DISCONTINUED] cephalexin cap 500 mg  500 mg Oral Q6H    [DISCONTINUED] dexAMETHasone 4 mg/mL inj   Intravenous PRN    [DISCONTINUED] famotidine (PF) 20 mg/2 mL inj   Intravenous PRN    [DISCONTINUED] fentaNYL (PF) 100 mcg/2 mL inj   Intravenous PRN    [DISCONTINUED] HYDROmorphone 1 mg/mL inj 0.2 mg  0.2 mg IV Push Q10 Min PRN    [DISCONTINUED] HYDROmorphone 1 mg/mL inj 0.4 mg  0.4 mg IV Push Q10 Min PRN    [DISCONTINUED] lidocaine (Cardiac) 100 mg/5 mL inj   Intravenous PRN    [DISCONTINUED] midazolam 1 mg/mL inj   Intravenous PRN    [DISCONTINUED] norepinephrine 8 mg in sodium chloride 0.9% 250 mL drip RTU (TITRATABLE/ICU)  0.1-1 mcg/kg/min Intravenous Continuous    [DISCONTINUED] ondansetron 4 mg/2 mL inj   Intravenous PRN    [DISCONTINUED] oxyCODONE 5 mg/5 mL soln 10 mg  10 mg Oral Q3H PRN    [DISCONTINUED] oxyCODONE 5 mg/5 mL soln 5 mg  5 mg Oral Q3H PRN    [DISCONTINUED] oxyCODONE tab 5 mg  5 mg Oral Q6H PRN    [DISCONTINUED] oxyCODONE tab 5 mg  5 mg Oral Q6H PRN    [DISCONTINUED] phenylephrine 10 mg/mL inj   Intravenous Continuous PRN    [DISCONTINUED] phenylephrine 10 mg/mL inj   Intravenous PRN    [DISCONTINUED] Plasma-Lyte-A pH 7.4 IV soln   Intravenous Continuous PRN    [DISCONTINUED] prochlorperazine 10 mg/2 mL inj 5 mg  5 mg Intravenous Once PRN    [DISCONTINUED] propofol 200 mg/20 mL inj   Intravenous PRN    [DISCONTINUED] rocuronium 10 mg/mL inj   Intravenous PRN    [  DISCONTINUED] sodium chloride 0.9% irrigation soln    Continuous PRN    [DISCONTINUED] sodium chloride 0.9% IV soln        [DISCONTINUED] sugammadex 200 mg/2 mL inj   Intravenous PRN    [DISCONTINUED] vancomycin topical powder    PRN     Vitals:    12/18/23 1316   BP: 125/75   Pulse: 83   Resp: 18   Temp: 36.1 ?C (97 ?F)   TempSrc: Temporal   SpO2: 93%   Weight: 143 lb (64.9 kg)     Wt Readings from Last 3 Encounters:   12/18/23 143 lb (64.9 kg)   12/13/23 136 lb 11 oz (62 kg)   12/10/23 131 lb 6.3 oz (59.6 kg)           PHYSICAL EXAM:   GENERAL: Well developed, well nourished. No apparent distress. Denies pain.  RESP: Normal effort on room air. No wheezes, rhonchi, crackles, or rales.   CV: RRR, S1S2, no murmurs, rubs, clicks, or gallops.   ABDOMEN: Soft. Distended, ascites ++, thrill+, no hepatomegaly, tympanitic with fullness in the flanks  SKIN: Warm, dry, and well perfused. No lesions, ulcerations  INCISION: abdominal incision is clean intact. Some bruising along the right rectus, and slightly tender.  EXTREMITIES: Warm, dry. No edema. Also complaining of right renal angle tenderness    LABORATORY:   Lab Results   Component Value Date    NA 139 12/10/2023    K 4.6 12/10/2023    CL 101 12/10/2023    BUN 24 (H) 12/10/2023    CREAT 1.18 12/10/2023    WBC 3.30 (L) 12/10/2023    HCT 39.8 12/10/2023    HGB 13.1 12/10/2023    PLT 83 (L) 12/10/2023       DIAGNOSTICS:   CT Cardiac with and without Contrast, Congenital, 11/08/23  FINDINGS:     Thoracic aorta:  Aorta arises anterior and leftward of the pulmonary artery, consistent with history of L-transposition of the great arteries. Left-sided aortic arch is patent. A prominent ductus diverticulum arising from the distal aortic arch measures 7 mm in width and   9 mm in length (key views).     The aortic measurements are as follows:  Aortic annulus:                      2.5 x 2.6 cm  Sinuses of Valsalva:             3.1 x 3.1 x 3.2 cm  Sinotubular Junction:            2.5 x 2.6 cm  Mid-Ascending Segment:     2.4 x 2.7 cm  Proximal Arch:                      2.4 x 2.5 cm  Distal Arch:                           2.1 x 2.3 cm  Mid-Descending Segment:   1.7 x 1.8 cm  Diaphragmatic Hiatus:          1.5 x 1.6 cm     Arch Vessels: Incompletely imaged with patent proximal segments of the right brachiocephalic, left common carotid and left subclavian arteries.     Pulmonary arteries: Widely patent extracardiac Fontan and Glenn shunts. The gland measures 2.8 x 3.0 cm. Extracardiac Fontan measures 2.2 x 2.5 cm. The branch  pulmonary arteries are patent. Right pulmonary artery measures 1.8 x 2.2 cm and the left   pulmonary artery 1.5 x 1.5 cm.      Systemic Veins:   Multiple Amplatzer embolization devices are present within a now occluded left SVC which previously drained to the coronary sinus. Persistent enhancement of the left SVC central to the occlusion devices likely via retrograde flow.     Patent brachiocephalic veins and SVC.     Numerous venous collaterals are present within the mediastinum.     Cardiac Chambers:  A large atrial septal defect measuring 2.9 cm in length allows for free communication between the left and right atria.     Ventricles are positioned with a superior-inferior relationship, the left-sided morphologic right ventricle superior to the right-sided morphologic left ventricle. A large basal muscular VSD results in a functionally single ventricle.        Coronary Arteries:  Rotated aortic root with the noncoronary sinus facing anteriorly. Conus branch arises directly from the aortic root. Proximal segments of the coronaries are patent without stenosis.     Non-Vascular Findings:  Chronic pleural thickening and trace volume left pleural fluid involving the posterior basal left costal pleura with coarse pleural calcifications. Query history of pleurodesis. Asymmetric mild left lower lobe volume loss with interstitial thickening.  Postsurgical changes of median sternotomy.  Epicardial pacer leads are present with an upper abdominal generator anterior to the left hepatic lobe of the liver.     IMPRESSION:  History of L-TGA and single ventricular morphology as detailed above, with widely patent Glenn and extracardiac Fontan shunts.  Left posterior costal pleural thickening, trace volume left pleural fluid and pleural calcifications with mild left lower lobe volume loss. Query prior pleurodesis.  Epicardial pacer leads with upper abdominal generator positioned anterior to the level of the left hepatic lobe.      IMPRESSION/PLAN:     Replacement of generator   Volume overload.    Increase to 80 mg of furosemide for the remainder of the week.   CXR to rule out effusions  Chem7 to confirm renal function  F/u with Dr Tobie Poet for ACHD    Future Appointments      Dec 18, 2023 1:00 PM  POSTOP with Lamiya Naas M. Urban Gibson, MD  [facility] Cardiac Surgery [facility]) [address] [place] [address]  [phone]     Jan 14, 2024 11:00 AM  Echo Adult Congenital Complete with MP1CI ECHO [facility] IMAGING  MP1 Cardiac Imaging [facility] [place]) [address]  [address]  [place] [address]  [phone]     Jan 14, 2024 1:00 PM  RETURN with Jamil A. Tobie Poet, MD  [facility] Cardiovascular Center [facility] [place]) [address]  [address]  [place] [address]  [phone]

## 2023-12-18 ENCOUNTER — Other Ambulatory Visit: Payer: BLUE CROSS/BLUE SHIELD

## 2023-12-18 ENCOUNTER — Ambulatory Visit: Payer: BLUE CROSS/BLUE SHIELD | Attending: Cardiovascular Disease

## 2023-12-18 ENCOUNTER — Ambulatory Visit: Payer: BLUE CROSS/BLUE SHIELD

## 2023-12-18 ENCOUNTER — Telehealth: Payer: BLUE CROSS/BLUE SHIELD

## 2023-12-18 DIAGNOSIS — Q204 Double inlet ventricle: Secondary | ICD-10-CM

## 2023-12-18 DIAGNOSIS — Z09 Encounter for follow-up examination after completed treatment for conditions other than malignant neoplasm: Secondary | ICD-10-CM

## 2023-12-18 DIAGNOSIS — Q208 Other congenital malformations of cardiac chambers and connections: Secondary | ICD-10-CM

## 2023-12-18 DIAGNOSIS — Z950 Presence of cardiac pacemaker: Secondary | ICD-10-CM

## 2023-12-18 MED ORDER — HYDROCODONE-ACETAMINOPHEN 5-325 MG PO TABS
1 | ORAL_TABLET | Freq: Four times a day (QID) | ORAL | 0 refills | Status: AC | PRN
Start: 2023-12-18 — End: ?

## 2023-12-18 MED ORDER — DAPAGLIFLOZIN PROPANEDIOL 10 MG PO TABS
10 mg | ORAL_TABLET | Freq: Every day | ORAL | 3 refills | Status: AC
Start: 2023-12-18 — End: 2023-12-19

## 2023-12-18 MED ORDER — DAPAGLIFLOZIN PROPANEDIOL 10 MG PO TABS
10 mg | ORAL_TABLET | Freq: Every day | ORAL | 3 refills | Status: AC
Start: 2023-12-18 — End: ?

## 2023-12-18 MED ORDER — METHOCARBAMOL 500 MG PO TABS
500 mg | ORAL_TABLET | Freq: Four times a day (QID) | ORAL | 0 refills | Status: AC
Start: 2023-12-18 — End: ?

## 2023-12-18 NOTE — Progress Notes
 [facility]     Date of Visit: 12/18/2023   Reason for Visit: Follow up s/p cardiac catheterization on 08/08/17, in setting of DORV s/p Fontan conversion to extracardiac baffle on 11/21/07, and subsequent placement of epicardial permanent pacemaker.     HPI: Ann Duran is a 48 y.o. 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  Underwent modified Fontan procedure (patch connection of the IVC/SVC via the right atrium to MPA) at age 31 yrs at the [facility].  Postoperative chylothorax in1982, subsequently requiring exploration by a left thoracotomy, with ligation of lymphatic vessel and thoracic duct.  Suffered fatigue and exercise intolerance in November 2008, and was found to be in atrial fibrillation.  Underwent cardioversion at [facility] by Dr. Charlesetta Garibaldi on 10/14/07, along with cardiac cath which revealed excellent RA/Fontan pressures with a mean of 12-92mmHg.  Underwent Fontan conversion on 11/21/07, involving takedown of previous Fontan and placement of an extracardiac Fontan and bidirectional Sherrine Maples shunt and placement of epicardial permanent pacing leads. Discharged on 11/29/07  Re-admitted on 12/03/07 for thoracentesis of large right pleural effusion, in the setting of bradycardia and junctional rhythm  Underwent surgical placement of abdominal pacemaker generator on 12/05/07, but atrial lead was non-capturing, left with single site ventricular pacing.  Discharged on 12/12/07, after aggressive diuresis in the setting of significant ascites and perineal edema.  Subsequent diuresis over the next 6 weeks with resolution of ascites on moderate dose lasix and moderate dose aldactone.  Underwent left hemidiaphragm plication by Dr. Larwance Sachs on 03/02/2009, unsuccessful attempt at placement of an atrial lead.  PPM rate set at VVI 90 bpm during the hospitalization, reduced to 60 bpm by Dr. Carollee Herter on 03/29/2009.  In the setting of poor chronotropic response to exercise, rate response activated by Dr. Carollee Herter in July 2011 with significantly improved exercise ability.  Underwent placement of Mirena IUD and D & C by Dr. Ardyth Man Parvatenini in February 2012. No cardiovascular complications.  Mirena IUD replaced with LEEP procedure in January 2018  Good functional capacity with MVO2 of 24.8 (88% predicted) in August 2016.  Low normal single RV function, improves with exercise.  No exercise induced arrhythmias  V-paced 99% of time at rate of 70, generator nearing ERI as of May 2018 remote check, requiring monthly checks to detect ERI.   Liver US in Sept 2017 showed no lesions but METAVIR score was 4 consistent with cirrhosis.  Underwent cardiac cath on 08/08/17 by Dr. Tobie Poet, showing mean Fontan pressures of , and liver biopsy showing extensive bridging fibrosis, but no cirrhosis.  Spirinolactone dose was doubled from 25mg  daily to 50mg  daily in an attempt to reduce Fontan volume pressure.  Underwent pacemaker generator replacement ([employer]tr[employer]) on 12/12/17 by Dr. Christeen Douglas to [place] for work in early 2020, had issues with the humidity, insomnia and had some exertional decline.   06/01/2021  cardiac catheterization & Liver Biopsy  by Dr. 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   Seen urgently in [facility] clinic in 7/24 with increased abdominal distention due to increased salt intake, diuretics increased but had evidence of AKI therefore diuretic dosing was adjusted multiple times over the next month.  EPS study performed by Dr. Carollee Herter on 12/09/22 showing ''Isolated atrial signal  in localized anterior site (inferior near AVV annulus)- otherwise, no atrial capture with high-output pacing.'' Generator replaced on 12/13/23 by Dr. Urban Gibson.    Interval History:  She was sent for add-on visit to [facility] clinic after noted to be volume overloaded in surgical clinic visit today (12/18/23) with Dr. Urban Gibson. The patient notes all abdominal bloating that feels like fluid. Her weight has increased from 135lb to 143lb. She has other complaints related to the procedure: multiple bruises on arms, back, and in groin. She feels well otherwise with no lower ext edema, no sig palpitations, no syncope, no presyncope.  She continues to be active and notes no changes in exercise tolerance.    Past Medical History: As above.  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 Darlyn Chamber here at [facility].  LEEP in Jan 2018.  Mirena IUD replaced in Jan 2018.  Nose bleeds s/p bipolar cauterization 10/2016    Allergies: No Known Allergies     Medications that the patient states to be currently taking   Medication Sig    acetaminophen 325 mg tablet Take 2 tablets (650 mg total) by mouth every six (6) hours as needed for Pain.    amoxicillin 500 mg tablet     Ascorbic Acid (VITAMIN C) 1000 MG tablet Take 1 tablet (1,000 mg total) by mouth as needed for.    cephalexin 500 mg capsule Take 1 capsule (500 mg total) by mouth four (4) times daily.    Cholecalciferol (VITAMIN D) 125 MCG (5000 UT) CAPS Take by mouth.    citalopram 20 mg tablet Take 1 tablet (20 mg total) by mouth.    clonazePAM 0.5 mg tablet TAKE 1 TABLET(0.5 MG) BY MOUTH AT NIGHT AS NEEDED FOR INSOMNIA    fluticasone 50 mcg/act nasal spray     furosemide 20 mg tablet Take 3 tablets (60 mg total) by mouth daily.    furosemide 80 mg tablet Please take Furosemide 80mg  in the morning and 40mg  in the afternoon.    HYDROcodone-acetaminophen 5-325 mg tablet Take 1 tablet by mouth every six (6) hours as needed for Severe Pain (Pain Scale 7-10). Max Daily Amount: 4 tablets    levonorgestrel (MIRENA, 52 MG,) 20 mcg/day IUD by Intrauterine route.    lisinopril 2.5 mg tablet Take 1 tablet (2.5 mg total) by mouth daily.    methocarbamol 500 mg tablet Take 1 tablet (500 mg total) by mouth four (4) times daily.    PANTOPRAZOLE 20 mg DR tablet TAKE 1 TABLET BY MOUTH DAILY AS  NEEDED    potassium chloride 10 MEQ tablet Take 1 tablet (10 mEq total) by mouth daily.    sildenafil 20 mg tablet Take 1 tablet (20 mg total) by mouth three (3) times daily.    spironolactone 50 mg tablet Take 3 tablets (150 mg total) by mouth daily. (Patient taking differently: Take 2 tablets (100 mg total) by mouth daily.)    valacyclovir 500 mg tablet ValACYclovir HCl - 500 MG Oral Tablet    zolpidem 5 mg tablet Take 0.5 tablets (2.5 mg total) by mouth at bedtime as needed for Sleep.      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  Nonsmoker, occas ETOH.    Family History: Mother and father in their 70s in good health. Siblings, she has1 brother who is in good health. There is no history of congenital heart disease in the  family.    ROS: Negative and noncontributory except as noted above in HPI and Interval Events.     Physical Exam:  VS: BP 114/69 (BP Location: Left arm, Patient Position: Sitting, Cuff Size: Regular)  ~ Pulse 73  ~ Temp 36.4 ?C (97.6 ?F) (Forehead)  ~ Resp 18  ~ Ht 5' 5'' (1.651 m)  ~ Wt 142 lb 8 oz (64.6 kg)  ~ SpO2 95%  ~ BMI 23.71 kg/m?    General: Pleasant woman, in NAD.  Skin: No rashes. Sternotomy and left thoracotomy scars well healed.  Head and Neck: Pupils are equal and reacting.   Mouth: Normal oral mucosa. Excellent dentition.  Neck: Jugular venous pressure difficult to estimate.  Chest:  Lungs clear bilaterally   Heart: Single S1 with no systolic murmurs or rubs. No diastolic murmurs or gallops.  Abdomen: Mildly distended, no fluid wave or ascites. Soft, non-tender.    Extremities: No edema on legs. Large bruises consistent with post-procedural groin access. Outlined in clinic today. Multiple bruises on arms consistent with IV access attempts. Small bruises on back consistent with laying flat during procedure.  Neurologic: Alert and oriented x 4.  Psychologic: Normal mood and affect.    Cardiac Diagnostic Data:    Echo 05/27/23:  1. Double out right ventricle with hypoplastic LV and large VSD with ''  functional single ventricle'' Right ventricular hypertrophy. Low normal  systemic RV systolic function.  2. 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  3. Large secundum ASD. Status post extracardiac Fontan. Visualized portions of  the Fontan appear patent, no obvious fenestration noted. Sherrine Maples shunt appears  normal.  4. L-transposition of the great arteries.  5. A prior echo performed on 05/25/2022 was reviewed for comparison. No  significant changes noted since the previous study.      Abdominal Ultrasound 05/25/2022  CLINICAL HISTORY: s/p Fontan, please assess for nodules, lesions and absesses.   COMPARISON: Abdominal ultrasound dated 05/23/2018.   TECHNIQUE: Real time grayscale and color Doppler images of the abdominal organs were obtained using a curved transducer.   FINDINGS:   Pancreas: Partially visualized and unremarkable.   Liver: Coarse in echotexture with irregular surface contour.   Focal liver lesions: None.   Portal vein: Normal, hepatopetal flow.   Gallbladder: Normally distended, containing gallstones. No gallbladder wall thickening. No sonographic Murphy's sign.   Bile ducts: Normal in caliber.   Kidneys: Normal in size with normal cortical thickness and echogenicity. No hydronephrosis.   Spleen: Splenomegaly.   Ascites: None in the upper abdomen.   Visualized proximal aorta and IVC: Unremarkable.   MEASUREMENTS: Liver: 14.6 cm Common Duct: 4.8 mm Right Kidney: 11.5 cm Left Kidney: 12.1 cm Spleen: 17.8 cm Aorta: 1.6 cm      IMPRESSION:    1. Coarsening of hepatic echotexture with irregularity in surface contour suggesting fibrosis/cirrhosis. No suspicious liver lesions.    2. Cholelithiasis without evidence of cholecystitis.    3. Splenomegaly suggesting portal hypertension.     Echocardiogram 05/25/2022  PHYSICIAN INTERPRETATION: CONOTRUNCAL ANATOMY: The cardiac structural malformations are consistent with a diagnosis of levo-transposition of the great arteries. LEFT VENTRICLE: Patient demonstrated normal sinus rhythm during echocardiogram. 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 RIGHT ATRIUM: Large secundum ASD. Status post extracardiac Fontan. Visualized portions of the Fontan appear patent, no obvious fenestration noted. Sherrine Maples shunt appears widely patent with low velocity flow noted. MITRAL VALVE: Trace mitral valve regurgitation. TRICUSPID VALVE: Trace tricuspid valve regurgitation. AORTIC VALVE: No evidence of aortic  valve regurgitation. AORTA: No coarctation of the aorta. PERICARDIUM: There is no evidence of pericardial effusion. CONCLUSIONS  1. 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  2. 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 Low normal systemic RV systolic function.  3. Large secundum ASD. Status post extracardiac Fontan. Visualized portions of the Fontan appear patent, no obvious fenestration noted. Sherrine Maples shunt appears normal.  4. L-transposition of the great arteries.  5. A prior echo performed on 08/09/2020 was reviewed for comparison. No significant changes noted since the previous study.    06/01/2021 Cardiac Catheterization [facility]- Dr. Bradd Canary                 Liver Biopsy 06/01/2021 [facility]- Dr. Bradd Canary       Echocardiogram 08/09/20- reviewed by me:  1. 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   2. Double out right ventricle with hypoplastic LV and large VSD with'' functional single ventricle'' Right ventricular hypertrophy. Low normal systemic RV systolic function.   3. Large secundum ASD. Status post extracardiac Fontan. Visualized portions of the Fontan appear patent, no obvious fenestration noted. Sherrine Maples shunt appears normal.   4. L-transposition of the great arteries.   5. Large secundum ASD. Status post extracardiac Fontan. Visualized portions of the Fontan appear patent, no obvious fenestration noted. Sherrine Maples shunt appears widely patent with low velocity flow noted.   6. A prior echo performed on 07/29/2019 was reviewed for comparison. No significant changes noted since the previous study.    Pacemaker remote transmission 05/07/20:  4.9 years longevity, VVIR mode, device function normal, one episode of possible NSVT (3 beats) on 6/12 after walking 5K, asymptomatic, 99% V pacing.    Echocardiogram 07/29/2019:  1. L-transposition of the great arteries.  2. 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 Low normal systemic RV systolic function.  3. Double outlet right ventricle-doubly commited ventricular septal defect-aorta anterior to pulmonary artery-no left ventricular outflow tract obstruction.  4. 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  5. Large secundum ASD. Status post extracardiac Fontan. Visualized portions of the Fontan appear patent, no obvious fenestration noted. Sherrine Maples shunt appears normal.  6. A prior echo performed on 05/13/2018 was reviewed for comparison. No significant changes noted since the previous study.    CAM  Monitor 04/12/17:  Predominant rhythm: Paced   Paced rhythm   Ventricular Ectopy = (<1%)  1802 isolated, unifocal PVCs and 208 pairs   Atrial Ectopy = (<1%)  1 isolated PAC  Predominantly V paced rhythm with rare supraventricular beats  (most are labeled PVC's). Symptoms of ''Chest discomfort'  correlated with 2 narrow complex beats/. Symptoms of  Dizziness/Lightheadedness correlated with ventricular pacing.  One of four button presses correlated with a narrow complex  beat, the remaining button presses correlated with ventricular  pacing.    Echocardiogram:  05/13/18  PHYSICIAN INTERPRETATION:  CONOTRUNCAL ANATOMY: The cardiac structural malformations are consistent with a diagnosis of levo-transposition of the great arteries.  LEFT VENTRICLE: Patient demonstrated normal sinus rhythm during echocardiogram. 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  RIGHT VENTRICLE: Double out right ventricle with hypoplastic LV and large VSD with '' functional single ventricle'' Right ventricular hypertrophy. Low normal systemic RV systolic function.  LEFT ATRIUM:  RIGHT ATRIUM: Right atrial pressure is estimated at 8 mmHg. Right atrial pressure is estimated at  8 mmHg. Large secundum ASD. Status post extracardiac Fontan. Visualized portions of the Fontan appear patent, no obvious fenestration noted. Sherrine Maples shunt   appears normal.  MITRAL VALVE: No evidence of mitral valve regurgitation.  TRICUSPID VALVE: Trace tricuspid valve regurgitation.  AORTIC VALVE: The aortic valve was not well visualized. Aortic valve area was not quantified by continuity equation on this study. Aorta is anterior of the pulmonic valve.  No evidence of aortic regurgitation.  PULMONIC VALVE: No indication of pulmonic valve regurgitation. Pulmonic is posterior of the aorta.  AORTA: Normal aortic arch, normal descending aorta, normal mid ascending aorta and the aortic root is normal in size and structure. No coarctation of the aorta.  PULMONARY ARTERY: The pulmonary artery is not well seen.  SYSTEMIC VEINS: The inferior vena cava is normal in size and exhibits less than 50% respiratory change.  PERICARDIUM: There is no evidence of pericardial effusion.  CONCLUSIONS:   1. L-transposition of the great arteries.   2. 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 Low normal systemic RV systolic function.   3. Double outlet right ventricle-doubly commited ventricular septal defect-aorta anterior to pulmonary artery-no left ventricular outflow tract obstruction.   4. 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   5. Large secundum ASD. Status post extracardiac Fontan. Visualized portions of the Fontan appear patent, no obvious fenestration noted. Sherrine Maples shunt appears normal.   6. A prior echo performed on 05/07/2017 was reviewed for comparison. No significant changes noted since the previous study.    Stress Echo/CPX:  05/13/18  BASELINE ECG:   Ventricular paced with biphasic T wave in lead V2  PROTOCOL: Bruce. (Treadmill advanced to stage 3 on its own during stage 2 of exercise after 1')  CONTROL:   HR:  72    BP: 110/64     O2 Sat:  98% (forehead) 92% (finger)   START:  1.7 MPH at 10% grade.   STOP: 11.4 METS with 3.4 mph at 14% grade x 2?37 after 7?37'' total exercise time due to shortness of breath.  Peak HR: 129      Peak BP: 128/66         Peak O2 Sat:  94%   Peak Double-Product: 16,512    Maximum VO2 = 1391 ml which is 77% of predicted, maximum VO2/kg = 23.7 ml/kg which is 85% of predicted (Note: The reference values are adjusted for weight and modality of exercise)  VE/VO2 =  40   VE/VCO2 = 36      VE/VCO2 slope = 30.1  VO2/HR =11.1 which is 111% predicted  Breathing Reserve = 41  RQ= 1.12  Anaerobic Threshold was reached at HR = 103 VO2 =  1144 ml     VO2/kg =  19.5 ml/kg  RESULTS:   Symptoms:  Shortness of breath and leg fatigue, denied chest pain  ST-T Changes: Uninterruptable in the presence of ventricular pacing.  Dysrhythmias:  Rare PVCs during exercise  BP Response:  Blunted.  IMPRESSIONS: Fair exercise tolerance achieving 71% maximum predicted heart rate, limited by shortness of breath. ST changes during exercise are uninterpretable in the presence of ventricular pacing. No chest pain. Rare PVCs during exercise. Blunted BP at a low double product. Maximum VO2/kg = 23.7 ml/kg which is 85% of predicted. VE/VCO2 slope = 30.1 which is 119% of predicted. Compared with previous stress/CPX of 06/21/15 exercise tolerance has decreased from 12.7 METS, MVO2/kg had been 24.8,  VE/VCO2 slope had been 23.1.  BASELINE:  Baseline: Single ventricle with low normal systolic function.  ADDITIONAL BASELINE FINDINGS:  LEFT VENTRICLE: Global left ventricular systolic function is mildly decreased (LVEF 40-49%).  MITRAL VALVE: Trace mitral valve regurgitation.  TRICUSPID VALVE: Trace tricuspid valve regurgitation.     Global left ventricular function increased appropriately with stress. No new segmental wall motion abnormalities were seen. Global left ventricular systolic function at peak stress is lower limits of normal (LVEF 50-55%). Mild mitral valve regurgitation.   Mild tricuspid regurgitation. Post-exercise: mild increase in single ventricle contractility.     SUMMARY:   1. Please see separately resulted cardiopulmonary exercise test for details of exercise performance.   2. DORV, L-TGA, hypoplastic LV, large VSD, large ASD.   3. Baseline: Single ventricle with low normal systolic function.   4. Post-exercise: mild increase in single ventricle contractility    Liver US:  05/13/18  IMPRESSION:  1. Irregular liver contour and coarsened in echotexture representing underlying chronic liver disease/fibrosis. No focal mass. Cholelithiasis. Splenomegaly supports portal hypertension. Normal liver Doppler.  2. Shear wave liver stiffness measurement indicating moderate to severe fibrosis (MetaVir stages F2-F3). Note, the morphologic appearance of the liver appears more fibrotic than Elastography measurements indicate.    Cardiac Cath:  08/08/17:      Liver US  07/01/17 (outside report) shows no lesions, Metavir score of 4.    Echocardiogram:  05/07/17 PHYSICIAN INTERPRETATION:  CONOTRUNCAL ANATOMY: The cardiac structural malformations are consistent with a diagnosis of levo-transposition of the great arteries.  LEFT VENTRICLE: Visually estimated left ventricular ejection fraction 55-60%. Patient demonstrated normal sinus rhythm during echocardiogram. 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  RIGHT VENTRICLE: Double out right ventricle with hypoplastic LV and large VSD with '' functional single ventricle'' Right ventricular hypertrophy. Low normal systemic RV systolic function.  RIGHT ATRIUM: Right atrial pressure is estimated at 8 mmHg. Right atrial pressure is estimated at 8 mmHg. Large secundum ASD. Status post extracardiac Fontan. Visualized portions of the Fontan appear patent, no obvious fenestration noted. Sherrine Maples shunt   appears normal.  MITRAL VALVE: No evidence of mitral valve regurgitation.  TRICUSPID VALVE: Mild tricuspid valve regurgitation.  AORTIC VALVE: The aortic valve was not well visualized. Aorta is anterior of the pulmonic valve.  No evidence of aortic regurgitation.  PULMONIC VALVE: No indication of pulmonic valve regurgitation. Pulmonic is posterior of the aorta.  AORTA: Normal aortic arch and normal descending aorta. The ascending aorta was not well visualized. Mildly enlarged aortic root (4.2 cm). No coarctation of the aorta.  PULMONARY ARTERY: The pulmonary artery is not well seen.  SYSTEMIC VEINS: The inferior vena cava is normal in size and exhibits less than 50% respiratory change.  PERICARDIUM: There is no evidence of pericardial effusion.  CONCLUSIONS:   1. L-transposition of the great arteries.   2. Double outlet right ventricle-doubly commited ventricular septal defect-aorta anterior to pulmonary artery-no left ventricular outflow tract obstruction.   3. 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 Low normal systemic RV systolic function.   4. Large secundum ASD. Status post extracardiac Fontan. Visualized portions of the Fontan appear patent, no obvious fenestration noted. Sherrine Maples shunt appears normal.   5. Left ventricular ejection fraction is approximately 55-60%.   6. Mildly enlarged aortic root (4.2 cm).   7. Mild tricuspid valve regurgitation.   8. A prior echo performed on 03/09/2016 was reviewed for comparison. No significant changes  noted since the previous study.   9. 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     ECG 11/07/16: Ventricular paced. Ventricular rate of 75 bpn, QRS duration 184 ms. QT/QTc 498/556 ms    Liver Ultrasound 07/27/16 per report from OSH: Liver 17.5 cm in length. It is normal in echodensity. Liver contour is smooth. No focal lesion os identified. Hepatopedal flow is identifies in the main, right, and left portal veins. Gallbladder and biliary tree: There are several small mobile gallstones. There is no focal tenderness. The gallbladder wall is thickened measuring 0.7 cm. There is no pericholecystic fluid. No intra or extrahepatic biliary ductal dilatation.      Echocardiogram 03/09/16: CONOTRUNCAL ANATOMY: The cardiac structural malformations are consistent with a diagnosis of levo-transposition of the great arteries. 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 The aorta is anterior to the pulmonary artery. There is no sub-aortic obstruction of left ventricular outflow tract. LEFT VENTRICLE: Normal left ventricular size. Visually estimated left ventricular ejection fraction 55-60%. Small left ventricular size. RIGHT VENTRICLE: (DTI 6.0 cm/s). 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 Low normal systemic RV systolic function. LEFT ATRIUM: Mild left atrial enlargement. RIGHT ATRIUM: Right atrial pressure is estimated at 8 mmHg. Large secundum ASD. Status post extracardiac Fontan. Visualized portions of the Fontan appear patent, no obvious fenestration noted. Sherrine Maples shunt appears normal. MITRAL VALVE: No evidence of mitral valve regurgitation. TRICUSPID VALVE: Mild tricuspid valve regurgitation. AORTIC VALVE: The aortic valve is trileaflet and structurally normal, with normal leaflet excursion. Aorta is anterior of the pulmonic valve. No evidence of aortic regurgitation. PULMONIC VALVE: No indication of pulmonic valve regurgitation. Pulmonic is posterior of the aorta. AORTA: Normal mid ascending aorta and normal aortic arch. Mildly enlarged aortic root (3.9 cm). SYSTEMIC VEINS: The inferior vena cava is normal in size and exhibits less than 50% respiratory change. PERICARDIUM: There is no evidence of pericardial effusion.    CONCLUSIONS:  1. 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 Low normal systemic RV systolic function.  2. Left ventricular ejection fraction is approximately 55-60%.  3. Double outlet right ventricle.  4. Double outlet right ventricle     -doubly commited ventricular septal defect     -aorta anterior to pulmonary artery     -no left ventricular outflow tract obstruction.  5. L-transposition of the great arteries.  6. Mild left atrial enlargement.  7. Large secundum ASD. Status post extracardiac Fontan. Visualized portions of the Fontan appear patent, no obvious fenestration noted. Sherrine Maples shunt appears normal.  8. Mild tricuspid valve regurgitation.  9. Mildly enlarged aortic root (3.9 cm).    Abdominal US 06/21/15:  The pancreas is partially visualized and grossly unremarkable. The liver is mildly heterogeneous in echogenicity. There is no focal liver lesion. Portal vein demonstrates hepatopetal flow. No intra- or extrahepatic biliary dilation. The common bile duct is normal in caliber. The gallbladder is normally distended, containing gallstones. No gallbladder wall thickening or pericholecystic fluid. No sonographic Murphy's sign. The spleen is mildly enlarged. The kidneys are normal in size. Both kidneys demonstrate normal cortical thickness and echogenicity. No hydronephrosis. There is no ascites. The visualized proximal aorta and IVC are unremarkable. MEASUREMENTS: Liver: 15.1 cm Common Duct: 3 mm Right Kidney: 10.5 cm Left Kidney: 11.5 cm Spleen: 17 cm Aorta: 1.1  cm SHEAR WAVE LIVER STIFFNESS MEASUREMENTS: Mean 1.63 +/-0.12 m/sec, Median 1.66 m/sec, equating to 5.7-12 kPA. REFERENCE (m/s, kPa): Normal: 0.81-1.22 m/s 2.0-4.5 kPa (METAVIR F0) Normal/mild: 1.22-1.37 m/s 4.5-5.7 kPa (METAVIR F0-F1) Mild/moderate: 1.37-2 m/s 5.7-12.0 kPa (METAVIR F2-F3) Moderate/severe: 2-2.64 m/s 12.0-21.0 kPa (METAVIR F3-F4) Severe: >2.64 m/s >21.0 kPa (METAVIR F4) IMPRESSION: 1. Heterogenous liver echogenicity, suggesting diffuse liver disease. No suspicious liver lesions. Patent hepatic vasculature. 2. Shear wave liver stiffness measurement indicating mild/moderate liver fibrosis, MetaVir score of F2-3. 3. Splenomegaly. 4. Cholelithiasis without evidence of acute cholecystitis.    Echocardiogram 06/21/15:  2D AND M-MODE MEASUREMENTS (normal ranges within parentheses): Aorta/Left Atrium: Aortic Root, d (2D): 2.8 cm LV DIASTOLIC FUNCTION: MV Peak E: 1.61 m/s E/e' Ratio: 40.2 LV IVRT: 134 msec Decel Time: 229 msec Right Ventricle: TAPSE: 1.0 cm Aortic Valve: AoV Max Vel: 1.05 m/s AoV Peak PG: 4 mmHg AoV Mean PG: Tricuspid Valve and PA/RV Systolic Pressure: TR Max Velocity: 3.2 m/s RA Pressure: 8 mmHg RVSP/PASP: 49 mmHg Pulmonic Valve: PV Max Velocity: 0.8 m/s PV Max PG: 2 mmHg PV Mean PG: Aorta: Ao Asc: 2.4 cm. PHYSICIAN INTERPRETATION: CONOTRUNCAL ANATOMY: The cardiac structural malformations are consistent with a diagnosis of levo-transposition of the great arteries. 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 The aorta is anterior to the pulmonary artery. There is no sub-aortic obstruction of left ventricular outflow tract. LEFT VENTRICLE: Visually estimated left ventricular ejection fraction 55-60%. Small left ventricular size. RIGHT VENTRICLE: TAPSE 1.0 cm, (DTI 6.0 cm/s). 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 Low normal systemic RV systolic function. RIGHT ATRIUM: Right atrial pressure is estimated at 8 mmHg. Large secundum ASD. Status post extracardiac Fontan. Visualized portions of the Fontan appear patent, no obvious fenestration noted. Unable to obtain images of Glenn shunt. MITRAL VALVE: No evidence of mitral valve regurgitation. TRICUSPID VALVE: Mild tricuspid valve regurgitation. Tricuspid regurgitation velocity is not well seen. AORTIC VALVE: The aortic valve is trileaflet and structurally normal, with normal leaflet excursion. Aorta is anterior of the pulmonic valve. No evidence of aortic regurgitation. PULMONIC VALVE: No indication of pulmonic valve regurgitation. Pulmonic is posterior of the aorta. AORTA: The aortic root is normal in size and structure, normal mid ascending aorta and normal aortic arch. SYSTEMIC VEINS: The inferior vena cava is not well visualized. PERICARDIUM: There is no evidence of pericardial effusion. CONCLUSIONS: 1. Small left ventricular size 2. 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 3. L-transposition of the great arteries. 4. Mild tricuspid valve regurgitation. 5. 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 Low normal systemic RV systolic function.     CPEX/Echo 06/11/15:  BASELINE: Low normal systemic RV systolic function at rest. Hypoplastic LV. ADDITIONAL BASELINE FINDINGS: MITRAL VALVE: Trace mitral valve regurgitation. TRICUSPID VALVE: Trace tricuspid valve regurgitation. No new segmental wall motion abnormalities were seen. Global left ventricular systolic function at peak stress is normal (LVEF 60-65%). Improved systemic RV systolic function with stress. No increase in degree of atrioventricular valve regurgitation with exercise. SUMMARY: 1. Improved systemic RV systolic function with stress. No increase in degree of atrioventricular valve regurgitation with exercise. 2. Low normal systemic RV systolic function at rest. Hypoplastic LV. BASELINE ECG: Ventricular paced rhythm PROTOCOL: Bruce. CONTROL: HR: 74  BP: 120/74 O2 Sat: 95 % START: 1.7 MPH at 10% grade. STOP: 12.7 METS with 4.2 mph at 16 % grade x 1?33'' after 10?33'' total exercise time due to shortness of breath. Peak HR: 141 Peak BP: 136/56 Peak O2 Sat: 95 % Peak Double-Product: 19,176 Maximum VO2 = 1386 ml which is 77 % of predicted, maximum VO2/kg = 24.8 ml/kg which is 88 % of predicted (Note: The reference values are adjusted for weight and modality of exercise) VE/VO2 = 35 VE/VCO2 = 31 VE/VCO2 slope = 23.1 VO2/HR = 9.8 which is 99 % predicted Breathing Reserve = 50 RQ= 1.13 Anaerobic Threshold was reached at HR = 103 VO2 = 1094 ml VO2/kg = 19.6 ml/kg RESULTS: Symptoms: Shortness of breath and leg fatigue; no chest pain or discomfort. ST-T Changes: Uninterpretable due to paced rhythm. Dysrhythmias: Rare PVC during exercise BP Response: Appropriate. IMPRESSIONS: Good exercise tolerance achieving 77 % maximum predicted heart rate, limited by shortness of breath. Exercise induced ST changes are uninterpretable due to paced rhythm. No exercise induced chest pain at a low double product. Rare PVCs as described above. Maximum VO2 = 1386 ml which is 77 % of predicted, maximum VO2/kg = 24.8 ml/kg which is 88 % of predicted . VE/VCO2 slope = 23.1 which is 92 % of predicted. Compared with previous stress/CPX of 04/24/13 maximum VO2/kg had been 25.0 at 11.9 METS    Echocardiogram:  04/21/13: CONCLUSIONS:  1. Double outlet right ventricle.  2. Moderately enlarged right ventricular size and low normal systolic function. 3. Moderately increased RV wall thickness. 4. Mild tricuspid regurgitation. 5. Status post extracardiac Fontan, appears widely patent, no fenestration noted. Sherrine Maples shunt not well visualized.    Labs:      07/04/23:  Na 131, K 5.2, creat 1.2, BUN 24, [place] 9.5, BNP 48  06/24/23:   Na 130, K 5.2, creat 1.25, BUN 30, Mg 2.3,   06/17/23:  Na 130, K 5.3, creat 1.08  05/25/23:  Na 135, K 4.9, creat 1.22, INR 1.2, LFTs acceptable, AFP 2.5, Rivereno 19-9 is 10  05/31/22:  WBC 3.3k, Hgb 13.2, plts 107k, INR 1.1, K 4.4, creat 0.86, TB 1, TP 8.2, AFP 3.6 19-9 14, prealbumin 27  05/02/2022 WBC 2.9 Hgb 13.1 Plts 90 Na 135  K 4.3 BUN 19 Cr 0.84 Alb 4.9 AST 24 ALT 19 ALP 74      Component 05/02/22 08/25/20 08/17/20 08/18/18   Auto WBC 2.9 Low  3.0 Low  2.8 Low  5.80   RBC 4.46 4.98 4.93 5.11   Hemoglobin 13.1 14.6 14.1 15.0   Hematocrit 38.7 43.5 43.0 44.5   MCV 87 87 87 87.1   MCH 29.4 29.3 28.6 29.4   MCHC 33.9 33.6 32.8 33.7   RDW 12.3 12.1 12.1 13.8   Platelets 90 Low Panic 90 101 Low   90 Low Panic   89 Low    Hematology Comments: Note:  Note:  Note:  --     08/18/18: WBC 5.8 RBC 5.11 Hgb 15 Hct 44.5 Plt 89 NA 139 K 5.3 BUN 16 Creat 0.81 Gluc 71 BNP 149 TSH 2.39  08/08/17: Creat 0.72, BUN 12, K 3.8  07/01/17:  Chol 121, trig 58, HDL 40, LDL 69, Hgb A1c 5.4, INR 1.2, prealbumin 24, TSH 3.6, Hct 43.2, Plat 82k  07/26/16: WBC 3.0, HGB 4.69, HCT 40.7, PLT 75, NA 137, K 3.8, BUN 13, CREAT 0.7, ALK PHOS 57, AST 30, ALT 38, TBil 1.3, GGT 100, PREALB  24, BNP 113, INR 1.2, TSH 4.24, FT3 3.6, FT4 1.05.  04/01/13:  Hgb 14.7, Hct 42.4, MCV 85.1, plat ct 106k, Na 143, K 4.2, creat 0.8, BUN 19, AST 36, ALT 32    Impressions:  DORV (although recent echos appear to show a DILV with single LVEF 50%, and earlier surgical notes describe her anatomy as DILV), L-malposed great arteries: s/p RA-PA anastomosis Fontan at age 34 years, now s/p Extracardiac Fontan and bidirectional Sherrine Maples shunt on 11/21/07 by Dr. Richardo Hanks, including bi-atrial Maze procedure and placement of a permanent atrial pacing lead.  Postoperative junctional rhythm and bradycardia: re-admitted 4 days after discharge with large right pericardial effusion requiring thoracentesis and placement of an abdominal pacemaker generator and epicardial ventricular lead on 12/05/07 (non-functioning atrial lead).  Ascites, improved on aldactone. No evidence of ascites on annual abdominal US.  Longstanding history of irregular and heavy menses, thus far unsuccessfully regulated on several types of hormonal therapy. Better controlled on Mirena since 2012.  Also has cervical HPV.  Now with CIN III, underwent LEEP procedure in Jan 2018  History of atrial fibrillation, but no post Fontan revision tachyarrhythmias thus far, formerly on coumadin (possible side effect of hair loss), now on ASA.  Longstanding elevated left hemidiapragm, s/p successful plication on 03/02/2009 by Dr. Larwance Sachs.  Liver US in 2014, 2016 and 2017, and 2018 shows no lesions, but 2017 and 2018 outside liver US report Metavir score of 4, which is consistent with cirrhosis.  2019 liver US at [facility] showed MetaVir stage F2-F3, although elastography impacted by congestive hepatopathy.   Cardiac cath and liver biopsy on 08/08/17, with mean Fontan pressure under sedation of , and liver biopsy showing extensive bridging fibrosis but no cirrhosis.  Spirinolactone was increased from 25 to 50mg  to 100mg  after this cath  Ventricular pacemaker, pacing 99% of time.   Generator replaced on 12/12/17 by Dr. Larwance Sachs. Deferred placement of transvenous atrial lead via her LPA to minimize V pacing, based on Sahra's wish not to be committed to anticoagulation  Stable exercise capacity in July 2019, with MVO2 23.7 (85% predicted), previously 24.8 and 88% predicted in 2016    [facility]    Labs (BMP, BNP, LFTs, GGT, AFP, INR, CBC, Prealbumin):  [x]  Within last year (date: 05/25/2023)  []  Ordered today:   []  Deferred:      Hep C screening  [x]  Prior HCV Ab negative (date: 06/21/15)  []  Not indicated (no surgery prior to early 1990s)  []  Ordered    Last hemodynamic cath:   [x]  Within last 10 years (date: 06/01/2021), see above results   []  Deferred. Reason:   []  Scheduled    Last Liver biopsy  [x]  Within last 10 years (date: 06/01/2021), see above results   []  Scheduled with cath    Liver Imaging:  []  MRI Abdomen within last 5 years (date: ), see above  []  Triple phase CT within last 5 years (date: ), see above  [x]  Liver ultrasound completed (date: 05/25/2022), see above  []  Ordered today, see above.     Advanced cardiac imaging:  []  Cardiac MRI (date: ), see above  [x]  Cardiac CT (date: 12/12/2007), see above  [x]  Deferred. Reason: Pacemaker  []  Ordered today, see above     PDE-5 Inhibitor recommended:  [x]  On sildenafil []  On tadalafil  []  Not tolerated. Previously on []  sildenafil (Year: )  []  tadalafil (Year: )   Side effect:  []  Deferred. Reason:   []   Ordered today, see above    Echo  [x]  Within last year (date: 05/25/2022)   []  Deferred. Reason:   []  Ordered today    Cardiopulmonary exercise test  [x]  Within last 3 years (date: 05/13/18), see above results  []  Deferred. Reason:   []  Ordered today    Advanced care planning  [x]  Date: Advanced directive in CC from 12/27/2010  []  Deferred. Reason:       Recommendations:   Increase furosemide to 80mg  PO daily for at least one week given volume overload. Patient will measure weights at home and obtain BMP in one week. Then she will return to clinic for follow-up evaluation.  We will add SGLT2i (farxiga) to the patient's regimen today given that procedural stress/volume triggered volume overload. She was counseled on side effects, especially glycosuria and UTIs.  Otherwise, continue cardiac medications with no changes (sildenafil 20mg  TID, aspirin 81mg , lisinopril 2.5mg ,)  Low sodium diet recommended.  For now continue current diuretic regimen, if she feels that she is getting volume overloaded then okay to increase lasix to 80 mg daily.  Stop additional KCL given borderline elevated K level.  Will address need for liver US and/or full Fontan labs at return clinic visit.  Continued hepatology FU with Dr Emerson Monte.   Okay to increase ASA to 162 mg daily based on Dr Bethann Berkshire recommendation, take with meals  Refer to GI given abdominal discomfort due to excess gas  Follow up in 6 months with repeat echo, liver US and Fontan labs    [employer]. Tonie Griffith, MD, MA  [facility] Adult Cardiothoracic Anesthesiology Fellow  12/18/2023  3:39 PM    I have seen and examined the patient and I agree with the findings, assessment, and recommendations made above which we developed together and communicated with the patient.

## 2023-12-18 NOTE — Patient Instructions
During bathing/showering, avoid immersing your surgical wound in water. Minimize exposure to water. Otherwise, please follow your surgeon's advice.  We agree with increasing your lasix (furosemide) dose to 80mg  by mouth daily. Please return on clinic on 01/01/24 at 11:30 AM (Wednsday) for follow up. Please obtain lab work and liver ultrasound prior to your visit (you can even obtain them several hours before your clinic visit at the [facility]).  Please start a new medication Marcelline Deist) and follow the instructions. We have sent a prescription for this to your pharmacy.

## 2023-12-18 NOTE — Telephone Encounter
error 

## 2023-12-19 ENCOUNTER — Telehealth: Payer: BLUE CROSS/BLUE SHIELD

## 2023-12-19 ENCOUNTER — Inpatient Hospital Stay: Payer: BLUE CROSS/BLUE SHIELD

## 2023-12-19 DIAGNOSIS — Z950 Presence of cardiac pacemaker: Secondary | ICD-10-CM

## 2023-12-19 DIAGNOSIS — R001 Bradycardia, unspecified: Secondary | ICD-10-CM

## 2023-12-19 NOTE — Telephone Encounter
LM that we got her remote transmission. She also needs a pacer eval and visit with Dr Carollee Herter within the next 6 weeks.

## 2023-12-19 NOTE — Procedures
 SEE MEDIA SECTION FOR DOWNLOAD PRINTOUT     Ann Duran is a 48 y.o. female  [phone]  Reading MD: Dr Carollee Herter     A [employer] Carelink remote monitor was received as a patient initiated download to set up new monitor S/P pacer replacement. The device function is normal with no recorded events.  Continue remote monitoring Q3 month. Plan in office follow up.  See Media for full report.

## 2023-12-20 ENCOUNTER — Inpatient Hospital Stay: Payer: BLUE CROSS/BLUE SHIELD | Attending: Cardiovascular Disease

## 2023-12-24 ENCOUNTER — Other Ambulatory Visit: Payer: BLUE CROSS/BLUE SHIELD

## 2023-12-26 ENCOUNTER — Ambulatory Visit: Payer: BLUE CROSS/BLUE SHIELD

## 2023-12-26 DIAGNOSIS — I495 Sick sinus syndrome: Secondary | ICD-10-CM

## 2023-12-26 DIAGNOSIS — Z950 Presence of cardiac pacemaker: Secondary | ICD-10-CM

## 2023-12-27 ENCOUNTER — Telehealth: Payer: BLUE CROSS/BLUE SHIELD

## 2023-12-27 NOTE — Telephone Encounter
 Can you please change [place] to STAT, thanks!

## 2023-12-30 ENCOUNTER — Telehealth: Payer: BLUE CROSS/BLUE SHIELD

## 2023-12-30 ENCOUNTER — Other Ambulatory Visit: Payer: BLUE CROSS/BLUE SHIELD

## 2023-12-30 DIAGNOSIS — Q208 Other congenital malformations of cardiac chambers and connections: Secondary | ICD-10-CM

## 2023-12-30 DIAGNOSIS — Q204 Double inlet ventricle: Secondary | ICD-10-CM

## 2023-12-30 DIAGNOSIS — R109 Unspecified abdominal pain: Secondary | ICD-10-CM

## 2023-12-30 NOTE — Addendum Note
 Addended by: Vernon Maish [facility]red on: 12/30/2023 11:57 AM     Modules accepted: Orders

## 2023-12-30 NOTE — Addendum Note
 Addended by: Bowden Boody Kind[facility] on: 12/30/2023 11:56 AM     Modules accepted: Orders

## 2023-12-30 NOTE — Addendum Note
 Addended by: Calinda Stockinger Kindred on: 12/30/2023 11:55 AM     Modules accepted: Orders

## 2023-12-30 NOTE — Telephone Encounter
 Call Back Request      Reason for call back:       Patient is requesting a call back regarding [place]   Per patient, [facility] and other outside facilities are completely booked for imaging  Patient wants to know if there is any other facility MD can recommend or how MD wants her to proceed  Patient prefers somewhere close to her home   Please advise , thank you    Any Symptoms:  []  Yes  [x]  No      If yes, what symptoms are you experiencing:    Duration of symptoms (how long):    Have you taken medication for symptoms (OTC or Rx):      If call was taken outside of clinic hours:    [] Patient or caller has been notified that this message was sent outside of normal clinic hours.     [] Patient or caller has been warm transferred to the physician's answering service. If applicable, patient or caller informed to please call us back if symptoms progress.  Patient or caller has been notified of the turnaround time of 1-2 business day(s).

## 2023-12-30 NOTE — Telephone Encounter
 See MyChart encounter

## 2023-12-30 NOTE — Addendum Note
 Addended by: Twanna Resh Kindred on: 12/30/2023 02:54 PM     Modules accepted: Orders

## 2024-01-01 ENCOUNTER — Ambulatory Visit: Payer: BLUE CROSS/BLUE SHIELD | Attending: Cardiovascular Disease

## 2024-01-01 ENCOUNTER — Inpatient Hospital Stay: Payer: BLUE CROSS/BLUE SHIELD

## 2024-01-01 DIAGNOSIS — Q204 Double inlet ventricle: Secondary | ICD-10-CM

## 2024-01-01 DIAGNOSIS — Z950 Presence of cardiac pacemaker: Secondary | ICD-10-CM

## 2024-01-01 DIAGNOSIS — I495 Sick sinus syndrome: Secondary | ICD-10-CM

## 2024-01-01 DIAGNOSIS — Q208 Other congenital malformations of cardiac chambers and connections: Secondary | ICD-10-CM

## 2024-01-01 NOTE — Procedures
 SEE MEDIA SECTION FOR DOWNLOAD PRINTOUT     DELAYNA SPARLIN is a 48 y.o. female  [id number]  Reading MD: Dr Riley Lam Medtronic Carelink remote monitor was received as a routine download. The device function is normal with no recorded events. Continue remote monitoring Q3 months. Next in office eval on 01/07/2024.See Media for full report.

## 2024-01-01 NOTE — Progress Notes
 Patient Consent to Telehealth   The patient agreed to participate in the video visit prior to joining the visit.         [facility]     Date of Visit: 01/01/2024   Reason for Visit: Follow up s/p cardiac catheterization on 08/08/17, in setting of DORV s/p Fontan conversion to extracardiac baffle on 11/21/07, and subsequent placement of epicardial permanent pacemaker.     HPI: Ann Duran is a 48 y.o. 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  Underwent modified Fontan procedure (patch connection of the IVC/SVC via the right atrium to MPA) at age 25 yrs at the [facility].  Postoperative chylothorax in1982, subsequently requiring exploration by a left thoracotomy, with ligation of lymphatic vessel and thoracic duct.  Suffered fatigue and exercise intolerance in November 2008, and was found to be in atrial fibrillation.  Underwent cardioversion at [facility] by Dr. Charlesetta Garibaldi on 10/14/07, along with cardiac cath which revealed excellent RA/Fontan pressures with a mean of 12-25mmHg.  Underwent Fontan conversion on 11/21/07, involving takedown of previous Fontan and placement of an extracardiac Fontan and bidirectional Sherrine Maples shunt and placement of epicardial permanent pacing leads. Discharged on 11/29/07  Re-admitted on 12/03/07 for thoracentesis of large right pleural effusion, in the setting of bradycardia and junctional rhythm  Underwent surgical placement of abdominal pacemaker generator on 12/05/07, but atrial lead was non-capturing, left with single site ventricular pacing.  Discharged on 12/12/07, after aggressive diuresis in the setting of significant ascites and perineal edema.  Subsequent diuresis over the next 6 weeks with resolution of ascites on moderate dose lasix and moderate dose aldactone.  Underwent left hemidiaphragm plication by Dr. Larwance Sachs on 03/02/2009, unsuccessful attempt at placement of an atrial lead.  PPM rate set at VVI 90 bpm during the hospitalization, reduced to 60 bpm by Dr. Carollee Herter on 03/29/2009.  In the setting of poor chronotropic response to exercise, rate response activated by Dr. Carollee Herter in July 2011 with significantly improved exercise ability.  Underwent placement of Mirena IUD and D & C by Dr. Ardyth Man Parvatenini in February 2012. No cardiovascular complications.  Mirena IUD replaced with LEEP procedure in January 2018  Good functional capacity with MVO2 of 24.8 (88% predicted) in August 2016.  Low normal single RV function, improves with exercise.  No exercise induced arrhythmias  V-paced 99% of time at rate of 70, generator nearing ERI as of May 2018 remote check, requiring monthly checks to detect ERI.   Liver US in Sept 2017 showed no lesions but METAVIR score was 4 consistent with cirrhosis.  Underwent cardiac cath on 08/08/17 by Dr. Tobie Poet, showing mean Fontan pressures of , and liver biopsy showing extensive bridging fibrosis, but no cirrhosis.  Spirinolactone dose was doubled from 25mg  daily to 50mg  daily in an attempt to reduce Fontan volume pressure.  Underwent pacemaker generator replacement ([employer]) on 12/12/17 by Dr. Christeen Douglas to [place] for work in early 2020, had issues with the humidity, insomnia and had some exertional decline.   06/01/2021  cardiac catheterization & Liver Biopsy  by Dr. 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   Seen urgently in [facility] clinic in 7/24 with increased abdominal distention due to increased salt intake, diuretics increased but  had evidence of AKI therefore diuretic dosing was adjusted multiple times over the next month.  EPS study performed by Dr. Carollee Herter on 12/09/22 showing ''Isolated atrial signal in localized anterior site (inferior near AVV annulus)- otherwise, no atrial capture with high-output pacing.'' Generator replaced on 12/13/23 by Dr. Urban Gibson.    Interval History:  She is feeling well and recovering well overall.  She has lost 8 lbs since her last visit with me ~ 2 weeks ago.  She is tolerating the [place] well and is ambulating without difficulty, no lower ext edema, decreased abdominal fullness/girth.    Past Medical History: As above.  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 Darlyn Chamber here at [facility].  LEEP in Jan 2018.  Mirena IUD replaced in Jan 2018.  Nose bleeds s/p bipolar cauterization 10/2016    Allergies: No Known Allergies     Medications that the patient states to be currently taking   Medication Sig    amoxicillin 500 mg tablet     Ascorbic Acid (VITAMIN C) 1000 MG tablet Take 1 tablet (1,000 mg total) by mouth as needed for.    aspirin 81 mg EC tablet Take 1 tablet (81 mg total) by mouth daily.    Cholecalciferol (VITAMIN D) 125 MCG (5000 UT) CAPS Take by mouth.    citalopram 20 mg tablet Take 1 tablet (20 mg total) by mouth.    clonazePAM 0.5 mg tablet TAKE 1 TABLET(0.5 MG) BY MOUTH AT NIGHT AS NEEDED FOR INSOMNIA    dapagliflozin (FARXIGA) 10 mg tablet Take 1 tablet (10 mg total) by mouth daily.    furosemide 80 mg tablet Please take Furosemide 80mg  in the morning and 40mg  in the afternoon.    levonorgestrel (MIRENA, 52 MG,) 20 mcg/day IUD by Intrauterine route.    lisinopril 2.5 mg tablet Take 1 tablet (2.5 mg total) by mouth daily.    PANTOPRAZOLE 20 mg DR tablet TAKE 1 TABLET BY MOUTH DAILY AS  NEEDED    sildenafil 20 mg tablet Take 1 tablet (20 mg total) by mouth three (3) times daily.    spironolactone 50 mg tablet Take 3 tablets (150 mg total) by mouth daily. (Patient taking differently: Take 2 tablets (100 mg total) by mouth daily.)    valacyclovir 500 mg tablet ValACYclovir HCl - 500 MG Oral Tablet    zolpidem 5 mg tablet Take 0.5 tablets (2.5 mg total) by mouth at bedtime as needed for Sleep.       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  Nonsmoker, occas ETOH.    Family History: Mother and father in their 8s in good health. Siblings, she has1 brother who is in good health. There is no history of congenital heart disease in the family.    ROS: Negative and noncontributory except as noted above in HPI and Interval Events.     Physical Exam:  VS: There were no vitals taken for this visit.   General: Pleasant woman, in NAD.    At last in person visit:  Skin: No rashes. Sternotomy and left thoracotomy scars well healed.  Head and Neck: Pupils are equal and reacting.   Mouth: Normal oral mucosa. Excellent dentition.  Neck: Jugular venous pressure difficult to estimate.  Chest:  Lungs clear bilaterally   Heart: Single S1 with no systolic murmurs or rubs. No diastolic murmurs or gallops.  Abdomen: Mildly distended, no fluid wave  or ascites. Soft, non-tender.    Extremities: No edema on legs. Large bruises consistent with post-procedural groin access. Outlined in clinic today. Multiple bruises on arms consistent with IV access attempts. Small bruises on back consistent with laying flat during procedure.  Neurologic: Alert and oriented x 4.  Psychologic: Normal mood and affect.    Cardiac Diagnostic Data:    Liver US at OSH 12/31/23:  Hepatic cirrhosis with marked splenomegaly. A few subcentimeter hyperechoic hepatic lesions are present. Consider repeat surveillance ultrasound in 3-6 months.     Echo 05/27/23:  1. Double out right ventricle with hypoplastic LV and large VSD with ''  functional single ventricle'' Right ventricular hypertrophy. Low normal  systemic RV systolic function.  2. 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  3. Large secundum ASD. Status post extracardiac Fontan. Visualized portions of  the Fontan appear patent, no obvious fenestration noted. Sherrine Maples shunt appears  normal.  4. L-transposition of the great arteries.  5. A prior echo performed on 05/25/2022 was reviewed for comparison. No  significant changes noted since the previous study.      Abdominal Ultrasound 05/25/2022  CLINICAL HISTORY: s/p Fontan, please assess for nodules, lesions and absesses.   COMPARISON: Abdominal ultrasound dated 05/23/2018.   TECHNIQUE: Real time grayscale and color Doppler images of the abdominal organs were obtained using a curved transducer.   FINDINGS:   Pancreas: Partially visualized and unremarkable.   Liver: Coarse in echotexture with irregular surface contour.   Focal liver lesions: None.   Portal vein: Normal, hepatopetal flow.   Gallbladder: Normally distended, containing gallstones. No gallbladder wall thickening. No sonographic Murphy's sign.   Bile ducts: Normal in caliber.   Kidneys: Normal in size with normal cortical thickness and echogenicity. No hydronephrosis.   Spleen: Splenomegaly.   Ascites: None in the upper abdomen.   Visualized proximal aorta and IVC: Unremarkable.   MEASUREMENTS: Liver: 14.6 cm Common Duct: 4.8 mm Right Kidney: 11.5 cm Left Kidney: 12.1 cm Spleen: 17.8 cm Aorta: 1.6 cm      IMPRESSION:    1. Coarsening of hepatic echotexture with irregularity in surface contour suggesting fibrosis/cirrhosis. No suspicious liver lesions.    2. Cholelithiasis without evidence of cholecystitis.    3. Splenomegaly suggesting portal hypertension.     Echocardiogram 05/25/2022  PHYSICIAN INTERPRETATION: CONOTRUNCAL ANATOMY: The cardiac structural malformations are consistent with a diagnosis of levo-transposition of the great arteries. LEFT VENTRICLE: Patient demonstrated normal sinus rhythm during echocardiogram. 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 RIGHT ATRIUM: Large secundum ASD. Status post extracardiac Fontan. Visualized portions of the Fontan appear patent, no obvious fenestration noted. Sherrine Maples shunt appears widely patent with low velocity flow noted. MITRAL VALVE: Trace mitral valve regurgitation. TRICUSPID VALVE: Trace tricuspid valve regurgitation. AORTIC VALVE: No evidence of aortic valve regurgitation. AORTA: No coarctation of the aorta. PERICARDIUM: There is no evidence of pericardial effusion. CONCLUSIONS  1. 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  2. 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 Low normal systemic RV systolic function.  3. Large secundum ASD. Status post extracardiac Fontan. Visualized portions of the Fontan appear patent, no obvious fenestration noted. Sherrine Maples shunt appears normal.  4. L-transposition of the great arteries.  5. A prior echo performed on 08/09/2020 was reviewed for comparison. No significant changes noted since the previous study.    06/01/2021 Cardiac Catheterization [facility]- Dr. Bradd Canary  Liver Biopsy 06/01/2021 [facility]- Dr. Bradd Canary       Echocardiogram 08/09/20- reviewed by me:  1. 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   2. Double out right ventricle with hypoplastic LV and large VSD with'' functional single ventricle'' Right ventricular hypertrophy. Low normal systemic RV systolic function.   3. Large secundum ASD. Status post extracardiac Fontan. Visualized portions of the Fontan appear patent, no obvious fenestration noted. Sherrine Maples shunt appears normal.   4. L-transposition of the great arteries.   5. Large secundum ASD. Status post extracardiac Fontan. Visualized portions of the Fontan appear patent, no obvious fenestration noted. Sherrine Maples shunt appears widely patent with low velocity flow noted.   6. A prior echo performed on 07/29/2019 was reviewed for comparison. No significant changes noted since the previous study.    Pacemaker remote transmission 05/07/20:  4.9 years longevity, VVIR mode, device function normal, one episode of possible NSVT (3 beats) on 6/12 after walking 5K, asymptomatic, 99% V pacing.    Echocardiogram 07/29/2019:  1. L-transposition of the great arteries.  2. 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 Low normal systemic RV systolic function.  3. Double outlet right ventricle-doubly commited ventricular septal defect-aorta anterior to pulmonary artery-no left ventricular outflow tract obstruction.  4. 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  5. Large secundum ASD. Status post extracardiac Fontan. Visualized portions of the Fontan appear patent, no obvious fenestration noted. Sherrine Maples shunt appears normal.  6. A prior echo performed on 05/13/2018 was reviewed for comparison. No significant changes noted since the previous study.    CAM  Monitor 04/12/17:  Predominant rhythm: Paced   Paced rhythm   Ventricular Ectopy = (<1%)  1802 isolated, unifocal PVCs and 208 pairs   Atrial Ectopy = (<1%)  1 isolated PAC  Predominantly V paced rhythm with rare supraventricular beats  (most are labeled PVC's). Symptoms of ''Chest discomfort'  correlated with 2 narrow complex beats/. Symptoms of  Dizziness/Lightheadedness correlated with ventricular pacing.  One of four button presses correlated with a narrow complex  beat, the remaining button presses correlated with ventricular  pacing.    Echocardiogram:  05/13/18  PHYSICIAN INTERPRETATION:  CONOTRUNCAL ANATOMY: The cardiac structural malformations are consistent with a diagnosis of levo-transposition of the great arteries.  LEFT VENTRICLE: Patient demonstrated normal sinus rhythm during echocardiogram. 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  RIGHT VENTRICLE: Double out right ventricle with hypoplastic LV and large VSD with '' functional single ventricle'' Right ventricular hypertrophy. Low normal systemic RV systolic function.  LEFT ATRIUM:  RIGHT ATRIUM: Right atrial pressure is estimated at 8 mmHg. Right atrial pressure is estimated at 8 mmHg. Large secundum ASD. Status post extracardiac Fontan. Visualized portions of the Fontan appear patent, no obvious fenestration noted. Sherrine Maples shunt   appears normal.  MITRAL VALVE: No evidence of mitral valve regurgitation.  TRICUSPID VALVE: Trace tricuspid valve regurgitation.  AORTIC VALVE: The aortic valve was not well visualized. Aortic valve area was not quantified by continuity equation on this study. Aorta is anterior of the pulmonic valve.  No evidence of aortic regurgitation.  PULMONIC VALVE: No indication of pulmonic valve regurgitation. Pulmonic is posterior of the aorta.  AORTA: Normal aortic arch, normal descending aorta, normal mid ascending aorta and the aortic root is normal in size and structure. No coarctation of the aorta.  PULMONARY ARTERY: The pulmonary artery is not well seen.  SYSTEMIC VEINS:  The inferior vena cava is normal in size and exhibits less than 50% respiratory change.  PERICARDIUM: There is no evidence of pericardial effusion.  CONCLUSIONS:   1. L-transposition of the great arteries.   2. 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 Low normal systemic RV systolic function.   3. Double outlet right ventricle-doubly commited ventricular septal defect-aorta anterior to pulmonary artery-no left ventricular outflow tract obstruction.   4. 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   5. Large secundum ASD. Status post extracardiac Fontan. Visualized portions of the Fontan appear patent, no obvious fenestration noted. Sherrine Maples shunt appears normal.   6. A prior echo performed on 05/07/2017 was reviewed for comparison. No significant changes noted since the previous study.    Stress Echo/CPX:  05/13/18  BASELINE ECG:   Ventricular paced with biphasic T wave in lead V2  PROTOCOL: Bruce. (Treadmill advanced to stage 3 on its own during stage 2 of exercise after 1')  CONTROL: HR:  72    BP: 110/64     O2 Sat:  98% (forehead) 92% (finger)   START:  1.7 MPH at 10% grade.   STOP: 11.4 METS with 3.4 mph at 14% grade x 2?37 after 7?37'' total exercise time due to shortness of breath.  Peak HR: 129      Peak BP: 128/66         Peak O2 Sat:  94%   Peak Double-Product: 16,512    Maximum VO2 = 1391 ml which is 77% of predicted, maximum VO2/kg = 23.7 ml/kg which is 85% of predicted (Note: The reference values are adjusted for weight and modality of exercise)  VE/VO2 =  40   VE/VCO2 = 36      VE/VCO2 slope = 30.1  VO2/HR =11.1 which is 111% predicted  Breathing Reserve = 41  RQ= 1.12  Anaerobic Threshold was reached at HR = 103 VO2 =  1144 ml     VO2/kg =  19.5 ml/kg  RESULTS:   Symptoms:  Shortness of breath and leg fatigue, denied chest pain  ST-T Changes: Uninterruptable in the presence of ventricular pacing.  Dysrhythmias:  Rare PVCs during exercise  BP Response:  Blunted.  IMPRESSIONS: Fair exercise tolerance achieving 71% maximum predicted heart rate, limited by shortness of breath. ST changes during exercise are uninterpretable in the presence of ventricular pacing. No chest pain. Rare PVCs during exercise. Blunted BP at a low double product. Maximum VO2/kg = 23.7 ml/kg which is 85% of predicted. VE/VCO2 slope = 30.1 which is 119% of predicted. Compared with previous stress/CPX of 06/21/15 exercise tolerance has decreased from 12.7 METS, MVO2/kg had been 24.8, VE/VCO2 slope had been 23.1.  BASELINE:  Baseline: Single ventricle with low normal systolic function.  ADDITIONAL BASELINE FINDINGS:  LEFT VENTRICLE: Global left ventricular systolic function is mildly decreased (LVEF 40-49%).  MITRAL VALVE: Trace mitral valve regurgitation.  TRICUSPID VALVE: Trace tricuspid valve regurgitation.     Global left ventricular function increased appropriately with stress. No new segmental wall motion abnormalities were seen. Global left ventricular systolic function at peak stress is lower limits of normal (LVEF 50-55%). Mild mitral valve regurgitation.   Mild tricuspid regurgitation. Post-exercise: mild increase in single ventricle contractility.     SUMMARY:   1. Please see separately resulted cardiopulmonary exercise test for details of exercise performance.   2. DORV, L-TGA, hypoplastic LV, large VSD, large ASD.   3. Baseline: Single  ventricle with low normal systolic function.   4. Post-exercise: mild increase in single ventricle contractility    Liver US:  05/13/18  IMPRESSION:  1. Irregular liver contour and coarsened in echotexture representing underlying chronic liver disease/fibrosis. No focal mass. Cholelithiasis. Splenomegaly supports portal hypertension. Normal liver Doppler.  2. Shear wave liver stiffness measurement indicating moderate to severe fibrosis (MetaVir stages F2-F3). Note, the morphologic appearance of the liver appears more fibrotic than Elastography measurements indicate.    Cardiac Cath:  08/08/17:      Liver US  07/01/17 (outside report) shows no lesions, Metavir score of 4.    Echocardiogram:  05/07/17 PHYSICIAN INTERPRETATION:  CONOTRUNCAL ANATOMY: The cardiac structural malformations are consistent with a diagnosis of levo-transposition of the great arteries.  LEFT VENTRICLE: Visually estimated left ventricular ejection fraction 55-60%. Patient demonstrated normal sinus rhythm during echocardiogram. 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  RIGHT VENTRICLE: Double out right ventricle with hypoplastic LV and large VSD with '' functional single ventricle'' Right ventricular hypertrophy. Low normal systemic RV systolic function.  RIGHT ATRIUM: Right atrial pressure is estimated at 8 mmHg. Right atrial pressure is estimated at 8 mmHg. Large secundum ASD. Status post extracardiac Fontan. Visualized portions of the Fontan appear patent, no obvious fenestration noted. Sherrine Maples shunt   appears normal.  MITRAL VALVE: No evidence of mitral valve regurgitation.  TRICUSPID VALVE: Mild tricuspid valve regurgitation.  AORTIC VALVE: The aortic valve was not well visualized. Aorta is anterior of the pulmonic valve.  No evidence of aortic regurgitation.  PULMONIC VALVE: No indication of pulmonic valve regurgitation. Pulmonic is posterior of the aorta.  AORTA: Normal aortic arch and normal descending aorta. The ascending aorta was not well visualized. Mildly enlarged aortic root (4.2 cm). No coarctation of the aorta.  PULMONARY ARTERY: The pulmonary artery is not well seen.  SYSTEMIC VEINS: The inferior vena cava is normal in size and exhibits less than 50% respiratory change.  PERICARDIUM: There is no evidence of pericardial effusion.  CONCLUSIONS:   1. L-transposition of the great arteries.   2. Double outlet right ventricle-doubly commited ventricular septal defect-aorta anterior to pulmonary artery-no left ventricular outflow tract obstruction.   3. 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 Low normal systemic RV systolic function.   4. Large secundum ASD. Status post extracardiac Fontan. Visualized portions of the Fontan appear patent, no obvious fenestration noted. Sherrine Maples shunt appears normal.   5. Left ventricular ejection fraction is approximately 55-60%.   6. Mildly enlarged aortic root (4.2 cm).   7. Mild tricuspid valve regurgitation.   8. A prior echo performed on 03/09/2016 was reviewed for comparison. No significant changes noted since the previous study.   9. 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     ECG 11/07/16: Ventricular paced. Ventricular rate of 75 bpn, QRS duration 184 ms. QT/QTc 498/556 ms    Liver Ultrasound 07/27/16 per report from OSH: Liver 17.5 cm in length. It is normal in echodensity. Liver contour is smooth. No focal lesion os identified. Hepatopedal flow is identifies in the main, right, and left portal veins. Gallbladder and biliary tree: There are several small mobile gallstones. There is no focal tenderness. The gallbladder wall is thickened measuring 0.7 cm. There is no pericholecystic fluid. No intra or extrahepatic biliary ductal dilatation.      Echocardiogram 03/09/16: CONOTRUNCAL ANATOMY: The cardiac structural malformations are consistent with a diagnosis of levo-transposition of the great  arteries. 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 The aorta is anterior to the pulmonary artery. There is no sub-aortic obstruction of left ventricular outflow tract. LEFT VENTRICLE: Normal left ventricular size. Visually estimated left ventricular ejection fraction 55-60%. Small left ventricular size. RIGHT VENTRICLE: (DTI 6.0 cm/s). 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 Low normal systemic RV systolic function. LEFT ATRIUM: Mild left atrial enlargement. RIGHT ATRIUM: Right atrial pressure is estimated at 8 mmHg. Large secundum ASD. Status post extracardiac Fontan. Visualized portions of the Fontan appear patent, no obvious fenestration noted. Sherrine Maples shunt appears normal. MITRAL VALVE: No evidence of mitral valve regurgitation. TRICUSPID VALVE: Mild tricuspid valve regurgitation. AORTIC VALVE: The aortic valve is trileaflet and structurally normal, with normal leaflet excursion. Aorta is anterior of the pulmonic valve. No evidence of aortic regurgitation. PULMONIC VALVE: No indication of pulmonic valve regurgitation. Pulmonic is posterior of the aorta. AORTA: Normal mid ascending aorta and normal aortic arch. Mildly enlarged aortic root (3.9 cm). SYSTEMIC VEINS: The inferior vena cava is normal in size and exhibits less than 50% respiratory change. PERICARDIUM: There is no evidence of pericardial effusion.    CONCLUSIONS:  1. 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 Low normal systemic RV systolic function.  2. Left ventricular ejection fraction is approximately 55-60%.  3. Double outlet right ventricle.  4. Double outlet right ventricle     -doubly commited ventricular septal defect     -aorta anterior to pulmonary artery     -no left ventricular outflow tract obstruction.  5. L-transposition of the great arteries.  6. Mild left atrial enlargement.  7. Large secundum ASD. Status post extracardiac Fontan. Visualized portions of the Fontan appear patent, no obvious fenestration noted. Sherrine Maples shunt appears normal.  8. Mild tricuspid valve regurgitation.  9. Mildly enlarged aortic root (3.9 cm).    Abdominal US 06/21/15:  The pancreas is partially visualized and grossly unremarkable. The liver is mildly heterogeneous in echogenicity. There is no focal liver lesion. Portal vein demonstrates hepatopetal flow. No intra- or extrahepatic biliary dilation. The common bile duct is normal in caliber. The gallbladder is normally distended, containing gallstones. No gallbladder wall thickening or pericholecystic fluid. No sonographic Murphy's sign. The spleen is mildly enlarged. The kidneys are normal in size. Both kidneys demonstrate normal cortical thickness and echogenicity. No hydronephrosis. There is no ascites. The visualized proximal aorta and IVC are unremarkable. MEASUREMENTS: Liver: 15.1 cm Common Duct: 3 mm Right Kidney: 10.5 cm Left Kidney: 11.5 cm Spleen: 17 cm Aorta: 1.1 cm SHEAR WAVE LIVER STIFFNESS MEASUREMENTS: Mean 1.63 +/-0.12 m/sec, Median 1.66 m/sec, equating to 5.7-12 kPA. REFERENCE (m/s, kPa): Normal: 0.81-1.22 m/s 2.0-4.5 kPa (METAVIR F0) Normal/mild: 1.22-1.37 m/s 4.5-5.7 kPa (METAVIR F0-F1) Mild/moderate: 1.37-2 m/s 5.7-12.0 kPa (METAVIR F2-F3) Moderate/severe: 2-2.64 m/s 12.0-21.0 kPa (METAVIR F3-F4) Severe: >2.64 m/s >21.0 kPa (METAVIR F4) IMPRESSION: 1. Heterogenous liver echogenicity, suggesting diffuse liver disease. No suspicious liver lesions. Patent hepatic vasculature. 2. Shear wave liver stiffness measurement indicating mild/moderate liver fibrosis, MetaVir score of F2-3. 3. Splenomegaly. 4. Cholelithiasis without evidence of acute cholecystitis.    Echocardiogram 06/21/15:  2D AND M-MODE MEASUREMENTS (normal ranges within parentheses): Aorta/Left Atrium: Aortic Root, d (2D): 2.8 cm LV DIASTOLIC FUNCTION: MV Peak E: 1.91 m/s E/e' Ratio: 40.2 LV IVRT: 134 msec Decel Time: 229  msec Right Ventricle: TAPSE: 1.0 cm Aortic Valve: AoV Max Vel: 1.05 m/s AoV Peak PG: 4 mmHg AoV Mean PG: Tricuspid Valve and PA/RV Systolic Pressure: TR Max Velocity: 3.2 m/s RA Pressure: 8 mmHg RVSP/PASP: 49 mmHg Pulmonic Valve: PV Max Velocity: 0.8 m/s PV Max PG: 2 mmHg PV Mean PG: Aorta: Ao Asc: 2.4 cm. PHYSICIAN INTERPRETATION: CONOTRUNCAL ANATOMY: The cardiac structural malformations are consistent with a diagnosis of levo-transposition of the great arteries. 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 The aorta is anterior to the pulmonary artery. There is no sub-aortic obstruction of left ventricular outflow tract. LEFT VENTRICLE: Visually estimated left ventricular ejection fraction 55-60%. Small left ventricular size. RIGHT VENTRICLE: TAPSE 1.0 cm, (DTI 6.0 cm/s). 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 Low normal systemic RV systolic function. RIGHT ATRIUM: Right atrial pressure is estimated at 8 mmHg. Large secundum ASD. Status post extracardiac Fontan. Visualized portions of the Fontan appear patent, no obvious fenestration noted. Unable to obtain images of Glenn shunt. MITRAL VALVE: No evidence of mitral valve regurgitation. TRICUSPID VALVE: Mild tricuspid valve regurgitation. Tricuspid regurgitation velocity is not well seen. AORTIC VALVE: The aortic valve is trileaflet and structurally normal, with normal leaflet excursion. Aorta is anterior of the pulmonic valve. No evidence of aortic regurgitation. PULMONIC VALVE: No indication of pulmonic valve regurgitation. Pulmonic is posterior of the aorta. AORTA: The aortic root is normal in size and structure, normal mid ascending aorta and normal aortic arch. SYSTEMIC VEINS: The inferior vena cava is not well visualized. PERICARDIUM: There is no evidence of pericardial effusion. CONCLUSIONS: 1. Small left ventricular size 2. 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 3. L-transposition of the great arteries. 4. Mild tricuspid valve regurgitation. 5. 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 Low normal systemic RV systolic function.     CPEX/Echo 06/11/15:  BASELINE: Low normal systemic RV systolic function at rest. Hypoplastic LV. ADDITIONAL BASELINE FINDINGS: MITRAL VALVE: Trace mitral valve regurgitation. TRICUSPID VALVE: Trace tricuspid valve regurgitation. No new segmental wall motion abnormalities were seen. Global left ventricular systolic function at peak stress is normal (LVEF 60-65%). Improved systemic RV systolic function with stress. No increase in degree of atrioventricular valve regurgitation with exercise. SUMMARY: 1. Improved systemic RV systolic function with stress. No increase in degree of atrioventricular valve regurgitation with exercise. 2. Low normal systemic RV systolic function at rest. Hypoplastic LV. BASELINE ECG: Ventricular paced rhythm PROTOCOL: Bruce. CONTROL: HR: 74 BP: 120/74 O2 Sat: 95 % START: 1.7 MPH at 10% grade. STOP: 12.7 METS with 4.2 mph at 16 % grade x 1?33'' after 10?33'' total exercise time due to shortness of breath. Peak HR: 141 Peak BP: 136/56 Peak O2 Sat: 95 % Peak Double-Product: 19,176 Maximum VO2 = 1386 ml which is 77 % of predicted, maximum VO2/kg = 24.8 ml/kg which is 88 % of predicted (Note: The reference values are adjusted for weight and modality of exercise) VE/VO2 = 35 VE/VCO2 = 31 VE/VCO2 slope = 23.1 VO2/HR = 9.8 which is 99 % predicted Breathing Reserve = 50 RQ= 1.13 Anaerobic Threshold was reached at HR = 103 VO2 = 1094 ml VO2/kg = 19.6 ml/kg RESULTS: Symptoms: Shortness of breath and leg fatigue; no chest pain or  discomfort. ST-T Changes: Uninterpretable due to paced rhythm. Dysrhythmias: Rare PVC during exercise BP Response: Appropriate. IMPRESSIONS: Good exercise tolerance achieving 77 % maximum predicted heart rate, limited by shortness of breath. Exercise induced ST changes are uninterpretable due to paced rhythm. No exercise induced chest pain at a low double product. Rare PVCs as described above. Maximum VO2 = 1386 ml which is 77 % of predicted, maximum VO2/kg = 24.8 ml/kg which is 88 % of predicted . VE/VCO2 slope = 23.1 which is 92 % of predicted. Compared with previous stress/CPX of 04/24/13 maximum VO2/kg had been 25.0 at 11.9 METS    Echocardiogram:  04/21/13: CONCLUSIONS:  1. Double outlet right ventricle.  2. Moderately enlarged right ventricular size and low normal systolic function. 3. Moderately increased RV wall thickness. 4. Mild tricuspid regurgitation. 5. Status post extracardiac Fontan, appears widely patent, no fenestration noted. Sherrine Maples shunt not well visualized.    Labs:      12/31/23:  WBC 3.4, Hgb 12.4, Hct 38%, plts 112k, AFP 3.4, HgbA1C 5.6%, creat 1.02, BUN 25, alb 5.3, INR 1.1, GGT 88, LFTs normal,   07/04/23:  Na 131, K 5.2, creat 1.2, BUN 24, [place] 9.5, BNP 48  06/24/23:   Na 130, K 5.2, creat 1.25, BUN 30, Mg 2.3,   06/17/23:  Na 130, K 5.3, creat 1.08  05/25/23:  Na 135, K 4.9, creat 1.22, INR 1.2, LFTs acceptable, AFP 2.5, [place] 19-9 is 10  05/31/22:  WBC 3.3k, Hgb 13.2, plts 107k, INR 1.1, K 4.4, creat 0.86, TB 1, TP 8.2, AFP 3.6 19-9 14, prealbumin 27  05/02/2022 WBC 2.9 Hgb 13.1 Plts 90 Na 135  K 4.3 BUN 19 Cr 0.84 Alb 4.9 AST 24 ALT 19 ALP 74      Component 05/02/22 08/25/20 08/17/20 08/18/18   Auto WBC 2.9 Low  3.0 Low  2.8 Low  5.80   RBC 4.46 4.98 4.93 5.11   Hemoglobin 13.1 14.6 14.1 15.0   Hematocrit 38.7 43.5 43.0 44.5   MCV 87 87 87 87.1   MCH 29.4 29.3 28.6 29.4   MCHC 33.9 33.6 32.8 33.7   RDW 12.3 12.1 12.1 13.8   Platelets 90 Low Panic 90 101 Low   90 Low Panic   89 Low    Hematology Comments: Note:  Note:  Note:  --     08/18/18: WBC 5.8 RBC 5.11 Hgb 15 Hct 44.5 Plt 89 NA 139 K 5.3 BUN 16 Creat 0.81 Gluc 71 BNP 149 TSH 2.39  08/08/17: Creat 0.72, BUN 12, K 3.8  07/01/17:  Chol 121, trig 58, HDL 40, LDL 69, Hgb A1c 5.4, INR 1.2, prealbumin 24, TSH 3.6, Hct 43.2, Plat 82k  07/26/16: WBC 3.0, HGB 4.69, HCT 40.7, PLT 75, NA 137, K 3.8, BUN 13, CREAT 0.7, ALK PHOS 57, AST 30, ALT 38, TBil 1.3, GGT 100, PREALB 24, BNP 113, INR 1.2, TSH 4.24, FT3 3.6, FT4 1.05.  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 Richardo Hanks, including bi-atrial Maze procedure and placement of a permanent atrial pacing lead.  Postoperative junctional rhythm and bradycardia: re-admitted  4 days after discharge with large right pericardial effusion requiring thoracentesis and placement of an abdominal pacemaker generator and epicardial ventricular lead on 12/05/07 (non-functioning atrial lead).  Ascites, improved on aldactone. No evidence of ascites on annual abdominal US.  Longstanding history of irregular and heavy menses, thus far unsuccessfully regulated on several types of hormonal therapy. Better controlled on Mirena since 2012.  Also has cervical HPV.  Now with CIN III, underwent LEEP procedure in Jan 2018  History of atrial fibrillation, but no post Fontan revision tachyarrhythmias thus far, formerly on coumadin (possible side effect of hair loss), now on ASA.  Longstanding elevated left hemidiapragm, s/p successful plication on 03/02/2009 by Dr. Larwance Sachs.  Liver US in 2014, 2016 and 2017, and 2018 shows no lesions, but 2017 and 2018 outside liver US report Metavir score of 4, which is consistent with cirrhosis.  2019 liver US at [facility] showed MetaVir stage F2-F3, although elastography impacted by congestive hepatopathy.   Cardiac cath and liver biopsy on 08/08/17, with mean Fontan pressure under sedation of , and liver biopsy showing extensive bridging fibrosis but no cirrhosis.  Spirinolactone was increased from 25 to 50mg  to 100mg  after this cath  Ventricular pacemaker, pacing 99% of time.   Generator replaced on 12/12/17 by Dr. Larwance Sachs. Deferred placement of transvenous atrial lead via her LPA to minimize V pacing, based on Debrah's wish not to be committed to anticoagulation  Stable exercise capacity in July 2019, with MVO2 23.7 (85% predicted), previously 24.8 and 88% predicted in 2016  PM generator change by Dr Urban Gibson 12/10/23    [facility]    Labs (BMP, BNP, LFTs, GGT, AFP, INR, CBC, Prealbumin):  [x]  Within last year (date: 05/25/2023)  []  Ordered today:   []  Deferred:      Hep C screening  [x]  Prior HCV Ab negative (date: 06/21/15)  []  Not indicated (no surgery prior to early 1990s)  []  Ordered    Last hemodynamic cath:   [x]  Within last 10 years (date: 06/01/2021), see above results   []  Deferred. Reason:   []  Scheduled    Last Liver biopsy  [x]  Within last 10 years (date: 06/01/2021), see above results   []  Scheduled with cath    Liver Imaging:  []  MRI Abdomen within last 5 years (date: ), see above  []  Triple phase CT within last 5 years (date: ), see above  [x]  Liver ultrasound completed (date: 05/25/2022), see above  []  Ordered today, see above.     Advanced cardiac imaging:  []  Cardiac MRI (date: ), see above  [x]  Cardiac CT (date: 12/12/2007), see above  [x]  Deferred. Reason: Pacemaker  []  Ordered today, see above     PDE-5 Inhibitor recommended:  [x]  On sildenafil []  On tadalafil  []  Not tolerated. Previously on []  sildenafil (Year: )  []  tadalafil (Year: )   Side effect:  []  Deferred. Reason:   []  Ordered today, see above    Echo  [x]  Within last year (date: 05/25/2022)   []  Deferred. Reason:   []  Ordered today    Cardiopulmonary exercise test  [x]  Within last 3 years (date: 05/13/18), see above results  []  Deferred. Reason:   []  Ordered today    Advanced care planning  [x]  Date: Advanced directive in CC from 12/27/2010  []  Deferred. Reason:       Recommendations:   Continue lasix 80 mg daily, she is tolerating this well and labs from yesterday are reassuring  Continue [place]  10 mg daily. She was counseled on side effects, especially glycosuria and UTIs.  Otherwise, continue cardiac medications with no changes (sildenafil 20mg  TID, lisinopril 2.5mg ,)  Low sodium diet recommended.  Liver US reviewed, plan to repeat in the next 3-6 months, ordered  Continued hepatology FU with Dr Emerson Monte.   Okay to increase ASA to 162 mg daily based on Dr Bethann Berkshire recommendation, take with meals  Referred to GI given abdominal discomfort due to excess gas  Telehealth visit in next 3-4 months to assess how she is doing    48 minutes were spent personally by me today on this encounter which include today's pre-visit review of the chart, obtaining appropriate history, performing an evaluation, documentation and discussion of management with details supported within the note for today's visit. The time documented was exclusive of any time spent on the separately billed procedure.

## 2024-01-02 ENCOUNTER — Other Ambulatory Visit: Payer: BLUE CROSS/BLUE SHIELD

## 2024-01-07 ENCOUNTER — Ambulatory Visit: Payer: BLUE CROSS/BLUE SHIELD | Attending: Pediatric Cardiology

## 2024-01-07 ENCOUNTER — Inpatient Hospital Stay: Payer: BLUE CROSS/BLUE SHIELD

## 2024-01-07 DIAGNOSIS — Z950 Presence of cardiac pacemaker: Secondary | ICD-10-CM

## 2024-01-07 DIAGNOSIS — I495 Sick sinus syndrome: Secondary | ICD-10-CM

## 2024-01-08 ENCOUNTER — Inpatient Hospital Stay: Payer: BLUE CROSS/BLUE SHIELD

## 2024-01-08 DIAGNOSIS — R6889 Other general symptoms and signs: Secondary | ICD-10-CM

## 2024-01-09 ENCOUNTER — Other Ambulatory Visit: Payer: BLUE CROSS/BLUE SHIELD

## 2024-01-09 NOTE — Addendum Note
 Addended by: Floyce Stakes on: 01/09/2024 02:19 PM     Modules accepted: Orders

## 2024-01-09 NOTE — Addendum Note
 Addended by: Liliana Cline on: 01/09/2024 02:25 PM     Modules accepted: Orders

## 2024-01-10 NOTE — Procedures
 SEE MEDIA SECTION FOR PROGRAMMER PRINTOUT   [facility] EP Device Clinic Pacemaker     PATIENT: Ann Duran  MRN: [id number]  DOB: 11/14/1975     DATE OF PROCEDURE: 01/07/2024  TIME OF PROCEDURE: 1429     INDICATIONS: 1 mo post op pacemaker evaluation   PROCEDURE: Pacemaker evaluation     Location: [facility]     Technician: Candie Mile, NP     READING PHYSICIAN:  Dr. Charlesetta Garibaldi     Device:  Device Manufacturer: Medtronic  Device Model Name: Jamse Mead DR MRI  Model Number: [id number]  Device Serial Number: [id number]  Device Implant Date: 12/13/2023  Indication for Implant:      RA lead:   Manufacturer: Medtronic   Model Number: [id number]  Serial Number: [id number]  Date of Implant: 12/05/2007     RV lead:  Manufacturer: Medtronic   Model Number: [id number]  Serial Number: [id number]  Date of Implant: 12/05/2007     Battery: 7.3 years     Underlying Rhythm:   R wave: 20 mV     Impedance:  A: 399 ohms  V: 570 ohms     Capture threshold  V: 2 V @ 1.2 ms     Since 12/13/2023     Tachy episode summary:  VT/VF: 0      VP: 99.8%     Programmed changes:   RV output changed from 3.5 V @ 1.2 ms to 3 V @ 1.2 ms     Settings:   Mode: VVIR  Rate: Lower/Upper Sensor: 70/150 bpm  RV Output: 3 V @ 1.2 ms  RV Sensitivity: 2.0 mV     Assessment  1. 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 S/p Fontan conversion to extracardiac baffle on 11/21/07.  2. Underwent surgical placement of abdominal pacemaker generator on 12/05/07, but atrial lead was non-capturing, now with single site ventricular pacing. Generator change 12/12/2017 by Dr. Larwance Sachs. Generator change 12/13/2023 by Dr. Urban Gibson.  3. Patient is not pacemaker dependent.  4. Medtronic single chamber pacer with normal function.  5. No ventricular high rate episodes.     Plan:  1. Patient seen today by Dr. Charlesetta Garibaldi, see separate EP note.  2. Programming changes: RV output changed from 3.5 V @ 1.2 ms to 3 V @ 1.2 ms.  3. Remote monitoring every 3 months.  4. Return for device evaluation and consultation with Dr. Christell Constant in 1 year, sooner for any symptoms.  5. [facility] Access Database and Carelink website updated to reflect Dr. Christell Constant as following provider.     Shanon Payor, RN

## 2024-01-14 ENCOUNTER — Ambulatory Visit: Payer: BLUE CROSS/BLUE SHIELD | Attending: Cardiovascular Disease

## 2024-01-14 ENCOUNTER — Ambulatory Visit: Payer: BLUE CROSS/BLUE SHIELD

## 2024-01-19 ENCOUNTER — Other Ambulatory Visit: Payer: BLUE CROSS/BLUE SHIELD

## 2024-01-20 NOTE — Telephone Encounter
 Spoke with patient in detail about her RV lead being stable and the fact that she is not dependent. She will keep her current local EP in [place]. She will consider moving to app base monitoring for closer surveillance.

## 2024-01-30 NOTE — Progress Notes
 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    Subjective:   Ann Duran is a 48 y.o. female who was seen in clinic for pacemaker evaluation. He cardiac history is remarkable foir a diagnosis of L-transposition/ single ventricle. Her first cardiac surgery was a Modified RA tPA Fontan procedure at age 38. She required a thoracic duct ligation after her Fontan and then reportedly did well until age 71 when she presented with a complaint of fatigue and was diagnosed with atrial arrhythmias. She then underwent a Fontan conversion in Jan 2009 at which time pacemaker leads were placed but the device was placed 2 weeks later after she developed bradycardia and fluid retention. The atrial lead was non functional and so she was VVI paced. She underwent attempted atrial lead revision with diaphragm plication in April of 2010, but the new atrial lead also failed to function. She was tried on VVI backup pacing but was eventually changed to VVIR at 70 bpm lower rate, which she has tolerated well since 2011, but has had increasing requirement for ventricular pacing.    Interval Events:  Ann Duran underwent device replacement on 12-13-2023 without complications. This was done after an EP study showed diffuse atrial scarring with no viable site for atrial pacing and complete heart block..     Past Medical History:  Past Medical History:   Diagnosis Date    Anxiety     controlled per pt    Cholelithiases     Noted on ultrasound, asymptomatic     Delayed emergence from general anesthesia 2010    pacemaker insertion, per patient    DORV (double outlet right ventricle)     GERD (gastroesophageal reflux disease)     well controlled w/med and diet    History of blood transfusion     HPV (human papilloma virus) infection     IUD (intrauterine device) in place     Mirena placed 12/27/10, replaced 12/2015, replaced November 16, 2016    Pacemaker     in abd    Post-operative nausea and vomiting     Sick sinus syndrome (HCC/RAF)      Social History:  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  Negative tobacco use.  Rare EtOH.    Review of Systems:  14 point review of system negative other than as stated above    Medications:  Outpatient Medications Prior to Visit   Medication Sig Dispense Refill    amoxicillin 500 mg tablet       Ascorbic Acid (VITAMIN C) 1000 MG tablet Take 1 tablet (1,000 mg total) by mouth as needed for.      aspirin 81 mg EC tablet Take 1 tablet (81 mg total) by mouth daily. 90 tablet 3    Cholecalciferol (VITAMIN D) 125 MCG (5000 UT) CAPS Take by mouth.      citalopram 20 mg tablet Take 1 tablet (20 mg total) by mouth.      clonazePAM 0.5 mg tablet TAKE 1 TABLET(0.5 MG) BY MOUTH AT NIGHT AS NEEDED FOR INSOMNIA      cyanocobalamin 1000 mcg tablet Take 1 tablet (1,000 mcg total) by mouth as needed for.  dapagliflozin (FARXIGA) 10 mg tablet Take 1 tablet (10 mg total) by mouth daily. 60 tablet 3    fluticasone 50 mcg/act nasal spray   0    furosemide 80 mg tablet Please take Furosemide 80mg  in the morning and 40mg  in the afternoon. 120 tablet 3    levonorgestrel (MIRENA, 52 MG,) 20 mcg/day IUD by Intrauterine route.      lisinopril 2.5 mg tablet Take 1 tablet (2.5 mg total) by mouth daily. 90 tablet 3    PANTOPRAZOLE 20 mg DR tablet TAKE 1 TABLET BY MOUTH DAILY AS  NEEDED 90 tablet 3    potassium chloride 10 MEQ tablet Take 1 tablet (10 mEq total) by mouth daily. (Patient taking differently: Take 1 tablet (10 mEq total) by mouth as needed for. Every 3 days) 90 tablet 3    sildenafil 20 mg tablet Take 1 tablet (20 mg total) by mouth three (3) times daily. 270 tablet 3    spironolactone 50 mg tablet Take 3 tablets (150 mg total) by mouth daily. (Patient taking differently: Take 2 tablets (100 mg total) by mouth daily.) 270 tablet 3    valacyclovir 500 mg tablet ValACYclovir HCl - 500 MG Oral Tablet      zolpidem 5 mg tablet Take 0.5 tablets (2.5 mg total) by mouth at bedtime as needed for Sleep.      acetaminophen 325 mg tablet Take 2 tablets (650 mg total) by mouth every six (6) hours as needed for Pain. (Patient not taking: Reported on 01/07/2024) 30 tablet 0    cephalexin 500 mg capsule Take 1 capsule (500 mg total) by mouth four (4) times daily. (Patient not taking: Reported on 01/07/2024) 28 capsule 0    furosemide 20 mg tablet Take 3 tablets (60 mg total) by mouth daily. (Patient not taking: Reported on 01/07/2024) 260 tablet 3    HYDROcodone-acetaminophen 5-325 mg tablet Take 1 tablet by mouth every six (6) hours as needed for Severe Pain (Pain Scale 7-10). Max Daily Amount: 4 tablets (Patient not taking: Reported on 01/07/2024) 30 tablet 0    methocarbamol 500 mg tablet Take 1 tablet (500 mg total) by mouth four (4) times daily. (Patient not taking: Reported on 01/07/2024) 45 tablet 0     No facility-administered medications prior to visit.     Allergies:  No Known Allergies      Objective:     Vitals:  BP 102/62 (BP Location: Right arm, Patient Position: Sitting, Cuff Size: Regular)  ~ Pulse 71  ~ Temp 36.6 ?C (97.9 ?F) (Forehead)  ~ Resp 18  ~ Ht 1.651 m (5' 5'')  ~ Wt 62.8 kg (138 lb 6.4 oz)  ~ SpO2 95%  ~ BMI 23.03 kg/m?  Facility age limit for growth %iles is 20 years. Facility age limit for growth %iles is 20 years. Facility age limit for growth %iles is 20 years.    General:   alert and no distress   Skin:   warm and dry, no hyperpigmentation, vitiligo, or suspicious lesions   Nodes:   cervical, supraclavicular, and axillary nodes normal.   Eyes:   No conjunctival injection, sclerae anicteric   Oropharynx:  lips, mucosa, and tongue normal; teeth and gums normal   Neck:  no adenopathy, no carotid bruit, no JVD, supple, symmetrical, trachea midline and thyroid not enlarged, symmetric, no tenderness/mass/nodules   Lungs:   clear to auscultation bilaterally   Heart:   regular rate and rhythm, S1: single and S2: normal  Abdomen:   soft, non-tender; bowel sounds normal; no masses,  no organomegaly   Extremities:  extremities normal, atraumatic, no cyanosis or edema   Psychiatric:   normal mood, behavior, speech, dress, and thought processes     ECG today shows V pacing, and when pacing turned down, junctional rhythm at 57 bpm. No atrial activity present.    Assessment:   48 y.o. female with DORV status post Fontan conversion to extracardiac baffle on 11/21/2007, with subsequent placement of an epicardial pacemaker for sinus node dysfunction.  She had atrial lead failure shortly after initial placement.  In 2010 she underwent an unsuccessful attempt at placement of a new atrial lead.    She returns today for routine follow up. She has recovered from her procedures without complications.      Plan/ Recommendation:     Remote monitoring every3  months.  Follow up in clinic in one year    Genevieve Kerry. Cathleen Coach M.D.  Pediatric Cardiology

## 2024-04-01 ENCOUNTER — Inpatient Hospital Stay: Payer: BLUE CROSS/BLUE SHIELD

## 2024-04-01 DIAGNOSIS — I495 Sick sinus syndrome: Secondary | ICD-10-CM

## 2024-04-01 DIAGNOSIS — Z950 Presence of cardiac pacemaker: Secondary | ICD-10-CM

## 2024-04-02 NOTE — Procedures
 SEE MEDIA SECTION FOR DOWNLOAD PRINTOUT     Ann Duran is a 48 y.o. female  [id number]  Reading MD: Dr Rosella Conn Medtronic Carelink remote monitor was received as a routine download. The device function is normal with no recorded events. Continue remote monitoring Q3 months. See Media for full report.

## 2024-04-08 NOTE — Progress Notes
 [facility]  INITIAL SOCIAL WORK ASSESSMENT        PATIENT: Ann Duran  MRN: [id number]  DOB: 17-Apr-1976  DOS: 04/08/2024    PCP: Hobert Lull, DO  REFERRING [facility] CARDIOLOGIST: No ref. provider found    Time spent with patient: 60 minutes    HISTORY OF PRESENT ILLNESS:  Ann Duran is a 48 y.o. female who was referred to therapy by her Adult Congenital cardiologist, Dr. Jamil Aboulhosn for coping support and assessment due to patient's c/o depression, ADD and difficulty sleeping. She is also seeing an outside therapist whom she states is a  PhD, [employer] [facility] [employer].      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 Please see adult congenital cardiology note of 07/29/2019 by Dr. Jamil Aboulhosn for complete details and course.      PRESENTING PROBLEM, SYMPTOMS, SUBJECTIVE:  Ann Duran is going on a walk during our call.     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 She stayed close with this boss and still plays golf with him still.    In discussing her anxiety, she states that not having anything to look forward to is creating a sense of discomfort and a kind of stir crazy effect. She self describes as an extravert and misses being able to go out and do things. She does not have a close friend to do things with and has gone out by herself.    She has both doses of the Pfizer vaccine and is looking forward to doing things. The other issue she discussed is that most of the people she is meeting are married with children and the opportunities are not as prevalent.    She has not dated much and had dated someone but she was not attracted to but enjoys their company. She feels she has not given it a true chance as yet as the pandemic has prevented normal life from happening.    She also discussed how her restlessness is driven by her CHD which leads her to question and second guess many of her life decisions.    She considered going back to school for her MSW on a part time basis but learned that her 3rd year would be the practicum and without financial support, she could not see how to make it happen.    She has concerns about dating and wants to meet someone with the thought of getting married. She was last in a long term relationship that ended in 2017. He was living in [place]  and moved out to live near her and came to [place] .  She also dated someone for 3 years in her mid 68s. They had discussed getting married. The relationship ended after he cheated on her. She had another relationship after with a man who had just ended a prior relationship. They had strong connections and similar personalities but after connecting their mutual friends who got married, their relationship dissipated. He still lives in [place]. He was not able to be as available for her as she wanted.        CURRENT MEDICATIONS:  Outpatient Medications Prior to Visit   Medication Sig    Ascorbic Acid (VITAMIN C) 1000 MG tablet Take 1 tablet (1,000 mg total) by mouth as  needed for.    cyanocobalamin 1000 mcg tablet Take 1 tablet (1,000 mcg total) by mouth as needed for.    fluticasone 50 mcg/act nasal spray     valacyclovir 500 mg tablet ValACYclovir HCl - 500 MG Oral Tablet    zolpidem 5 mg tablet Take 0.5 tablets (2.5 mg total) by mouth at bedtime as needed for Sleep.     No facility-administered medications prior to visit.       PSYCHIATRIC REVIEW OF SYSTEMS:  Sleep: Struggles with falling asleep for many years, especially the past 6 to 8 months. Limited screen time prior to bed. If she tosses and turns she will get up and watch TV. Only sleeps in her bed.  Appetite changes: no  Weight changes: no  Energy: no  Interest/pleasure/anhedonia: somewhat depressed but feels 2' COVID-19 and shelter in place guidelines preventing/limiting social interaction  Somatic symptoms: no  Libido: no  Depression: yes, as above  Anxiety:yes, feels increased 2' COVID-19 precautions and guidelines.  Panic: no  Guilty/hopeless: no  Self-injurious behavior/risky behavior: no  Any drugs: no  Alcohol: social use.    PAST PSYCHIATRIC HISTORY:  Previous therapy: yes  Previous psychiatric treatment and medication trials: no She has no h/o endorsing any antidepressant or other SSRI. She endorses a rotation of half a Xanax, benedryl, half an ambian, and/or Tylenol pm.  Previous psychiatric hospitalizations: no  Previous diagnoses: States she has a h/o ADD and Anxiety.  Previous suicide attempts: no  History of violence: no  Currently in treatment with Her current therapist is described as a PhD, [employer]. She sees her every two months. She would like to follow up with us  every other week.  -She self describes as depressed.    SUBSTANCE ABUSE/ LIFESTYLE HISTORY:  Tobacco: no  EtOH: Social use. No DUI, no h/o blackouts  Marijuana: no  Illicit substances: no  IV substances: no  Prescription substances: no  Caffeine: One cup of coffee daily.  Tattoos: no      SOCIAL HISTORY:  Her mother and father are both in their 28s, living in [place] and stated to be in good health.   Ann Duran is living in [place] where she has been for a year and a half for work. She moved from [place] having been [place] and [place]. College at [employer] where she lived for 3.5 years.    EDUCATION, EMPLOYMENT, INCOME:  She has a bachelor's degree is in communications from [employer]. 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 She feels her parents would be supportive of her going back to school to get her masters. The cost is $22k for the program at around $620 per semester.  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 There are 300 people in the overall department and 87 working with university family and friends.    RELIGIOUS, SPIRITUAL, CULTURAL:  Catholic    ACTIVITIES, EXERCISE, INTERESTS:  She enjoys traveling, going to [employer], outdoor activities, tennis, golf, some hiking and others. She is aware of her abilities and comfortable making these known.  After her diaphragm bifurcation, she has been able to be more active. She is starting to re-engage in more activities in [place] and bought a treadmill.    LEGAL:  No issues    FAMILY  PSYCHIATRIC AND MEDICAL HISTORY:  Family mental health history not taken at this encounter. No known family h/o CHD. Parents and brother are stated to be in good health.     MENTAL STATUS EXAMINATION:  Appearance: Phone, unable to assess.  Appears: Phone, unable to assess.  Eye Contact: Phone, unable to assess.  Behavior and Motor Activity: Phone, unable to assess.  Attitude: No Abnormalities Noted Cooperative, friendly  Speech: No abnormalities noted (spontaneous, regular in rate, rhythm, volume and quantity).  Mood:  No Abnormalities Noted/Euthymic  Affect: Appropriate To Thought Content   Thought Process: No Abnormalities Noted  Thought Content: No Abnormalities Noted   Perception: No Abnormalities Noted  Cognition: Orientation: Time, Place, Person, Situation and Alert, Concentration:  Intact- As Evidenced by: ability to appropriately answer questions throughout the interview, Attention:  Intact- Evidenced by: ability to appropriately answer questions throughout the interview and Intellectual Functioning/Fund of Knowledge:  Above Average - Evidenced by use of language, vocabulary and explanation of office/corporate structure & 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       The patient was educated to the social worker?s availability, contact information and role within the [facility] to provide counseling, crisis intervention and/or referrals to community resources including ongoing or long-term counseling and psychotherapy. We explained treatment options and reviewed a potential follow-up plan with the patient.     DIAGNOSTIC IMPRESSION (Based on currently available information and may change as additional information becomes available):                                                                      PLAN/RECOMMENDATION:  We will follow up with Ann Duran every other week at her request to allow her to discuss her concerns and anxieties related to the COVID-19 pandemic and her feelings about living with and managing CHD. We will engage in CBT to assist with increasing coping measures and working to identify and manage dysfunctional behaviors that may be contributing to anxiety and lack of focus. We will also employ rational emotive therapy and reality therapy to assist with managing her concerns re: CHD.   -LCSW will provide counseling/therapy services for 8-12 sessions and re-evaluate at that time for additional sessions if necessary.   -Provide referrals as needed and will refer her to the [facility], Dr. Adaline Ada, for psychiatric evaluation and possible medication management.   -Ongoing follow up with the Adult Congenital Heart Disease Cardiologist as per their orders/recommendations.  We suggested she participate in our monthly [facility] [place] [facility] Patient and Family Connections Meetings and/or the monthly [facility] COVID-19 focused Connections meetings for additional peer support. She provided her email as [email or web address] and is open to receiving the Zoom link and reminders for these meetings.    Follow-up in 2 weeks.      Author: Myrtie Atkinson  N Homero Hyson, LCSW 04/08/2024 2:07 AM

## 2024-04-08 NOTE — Progress Notes
 Patient requested to reschedule to 02/16/20.

## 2024-04-27 MED ORDER — FUROSEMIDE 80 MG PO TABS
ORAL_TABLET | ORAL | 3 refills | 30.00000 days | Status: AC
Start: 2024-04-27 — End: ?

## 2024-04-28 MED ORDER — SILDENAFIL CITRATE 20 MG PO TABS
20 mg | ORAL_TABLET | Freq: Three times a day (TID) | ORAL | 3 refills | 30.00000 days | Status: AC
Start: 2024-04-28 — End: ?

## 2024-04-30 NOTE — Consults
 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    Subjective:   Ann Duran is a 48 y.o. female who was seen in clinic for pacemaker evaluation. He cardiac history is remarkable foir a diagnosis of L-transposition/ single ventricle. Her first cardiac surgery was a Modified RA tPA Fontan procedure at age 72. She required a thoracic duct ligation after her Fontan and then reportedly did well until age 56 when she presented with a complaint of fatigue and was diagnosed with atrial arrhythmias. She then underwent a Fontan conversion in Jan 2009 at which time pacemaker leads were placed but the device was placed 2 weeks later after she developed bradycardia and fluid retention. The atrial lead was non functional and so she was VVI paced. She underwent attempted atrial lead revision with diaphragm plication in April of 2010, but the new atrial lead also failed to function. She was tried on VVI backup pacing but was eventually changed to VVIR at 70 bpm lower rate, which she has tolerated well since 2011, but has had increasing requirement for ventricular pacing.     [name] underwent device replacement on 12-13-2023 without complications. This was done after an EP study showed diffuse atrial scarring with no viable site for atrial pacing and complete heart block.    Ann Duran has been doing well overall with no new concerns.     Past Medical History:  Past Medical History:   Diagnosis Date    Anxiety     controlled per pt    Cholelithiases     Noted on ultrasound, asymptomatic     Delayed emergence from general anesthesia 2010    pacemaker insertion, per patient    DORV (double outlet right ventricle)     GERD (gastroesophageal reflux disease)     well controlled w/med and diet    History of blood transfusion     HPV (human papilloma virus) infection     IUD (intrauterine device) in place     Mirena placed 12/27/10, replaced 12/2015, replaced November 16, 2016    Pacemaker     in abd    Post-operative nausea and vomiting     Sick sinus syndrome (HCC/RAF)      Social History:  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  Negative tobacco use.  Rare EtOH.    Review of Systems:  14 point review of system negative other than as stated above    Medications:  Outpatient Medications Prior to Visit   Medication Sig Dispense Refill    amoxicillin 500 mg tablet       Ascorbic Acid (VITAMIN C) 1000 MG tablet Take 1 tablet (1,000 mg total) by mouth as needed for.      aspirin  81 mg EC tablet Take 1 tablet (81 mg total) by mouth daily. 90 tablet 3    Cholecalciferol (VITAMIN D) 125 MCG (5000 UT) CAPS Take by mouth.      citalopram 20 mg tablet Take 1 tablet (20 mg total) by mouth.      clonazePAM 0.5 mg tablet TAKE 1 TABLET(0.5 MG) BY MOUTH AT NIGHT AS NEEDED FOR INSOMNIA      dapagliflozin  (FARXIGA ) 10 mg tablet Take 1 tablet (10  mg total) by mouth daily. 60 tablet 3    furosemide  80 mg tablet Please take Furosemide  80mg  in the morning and 40mg  in the afternoon (Patient taking differently: Take 0.5 tablets (40 mg total) by mouth two (2) times daily. Please take Furosemide  80mg  in the morning and 40mg  in the afternoon) 120 tablet 3    levonorgestrel (MIRENA, 52 MG,) 20 mcg/day IUD by Intrauterine route.      lisinopril  2.5 mg tablet Take 1 tablet (2.5 mg total) by mouth daily. 90 tablet 3    PANTOPRAZOLE  20 mg DR tablet TAKE 1 TABLET BY MOUTH DAILY AS  NEEDED 90 tablet 3    potassium chloride  10 MEQ tablet Take 1 tablet (10 mEq total) by mouth daily. (Patient taking differently: Take 1 tablet (10 mEq total) by mouth as needed for. Every 3 days) 90 tablet 3    sildenafil  20 mg tablet Take 1 tablet (20 mg total) by mouth three (3) times daily. 270 tablet 3 spironolactone  50 mg tablet Take 3 tablets (150 mg total) by mouth daily. (Patient taking differently: Take 2 tablets (100 mg total) by mouth daily.) 270 tablet 3    valacyclovir 500 mg tablet ValACYclovir HCl - 500 MG Oral Tablet      zolpidem 5 mg tablet Take 0.5 tablets (2.5 mg total) by mouth at bedtime as needed for Sleep.      clonazePAM 0.5 mg tablet Take 1 tablet (0.5 mg total) by mouth.      cyanocobalamin 1000 mcg tablet Take 1 tablet (1,000 mcg total) by mouth as needed for. (Patient not taking: Reported on 05/06/2024)      fluticasone 50 mcg/act nasal spray  (Patient not taking: Reported on 05/06/2024)  0    ketoconazole 2% cream  (Patient not taking: Reported on 05/06/2024)       No facility-administered medications prior to visit.     Allergies:  Allergies   Allergen Reactions    Other          Objective:     Vitals:  BP 106/67 (BP Location: Left arm, Patient Position: Sitting, Cuff Size: Regular)  ~ Pulse 72  ~ Temp 36.5 ?C (97.7 ?F) (Forehead)  ~ Resp 18  ~ Ht 5' 4'' (1.626 m)  ~ Wt 136 lb (61.7 kg)  ~ SpO2 95%  ~ BMI 23.34 kg/m?  Facility age limit for growth %iles is 20 years. Facility age limit for growth %iles is 20 years. Facility age limit for growth %iles is 20 years.    General:   alert and no distress   Skin:   warm and dry, no hyperpigmentation, vitiligo, or suspicious lesions   Nodes:   cervical, supraclavicular, and axillary nodes normal.   Eyes:   No conjunctival injection, sclerae anicteric   Oropharynx:  lips, mucosa, and tongue normal; teeth and gums normal   Neck:  no adenopathy, no carotid bruit, no JVD, supple, symmetrical, trachea midline and thyroid not enlarged, symmetric, no tenderness/mass/nodules   Lungs:   clear to auscultation bilaterally   Heart:   regular rate and rhythm, S1: single and S2: normal   Abdomen:   soft, non-tender; bowel sounds normal; no masses,  no organomegaly   Extremities:  extremities normal, atraumatic, no cyanosis or edema   Psychiatric:   normal mood, behavior, speech, dress, and thought processes     ECG today shows V pacing, and when pacing turned down, junctional rhythm at 57 bpm. No atrial activity present.  Assessment:   48 y.o. female with DORV status post Fontan conversion to extracardiac baffle on 11/21/2007, with subsequent placement of an epicardial pacemaker for sinus node dysfunction.  She had atrial lead failure shortly after initial placement.  In 2010 she underwent an unsuccessful attempt at placement of a new atrial lead.     She returns today for routine follow up. We have lowered her sensing width to 1 in order to maximize her battery life.    Plan/ Recommendation:     Remote monitoring every 3 months.  Follow up in clinic in one year    Venetia SQUIBB. Georgina, MD

## 2024-05-04 ENCOUNTER — Ambulatory Visit: Payer: PRIVATE HEALTH INSURANCE

## 2024-05-04 DIAGNOSIS — Z950 Presence of cardiac pacemaker: Principal | ICD-10-CM

## 2024-05-04 DIAGNOSIS — I495 Sick sinus syndrome: Secondary | ICD-10-CM

## 2024-05-04 DIAGNOSIS — R001 Bradycardia, unspecified: Secondary | ICD-10-CM

## 2024-05-05 ENCOUNTER — Ambulatory Visit: Payer: PRIVATE HEALTH INSURANCE | Attending: Cardiovascular Disease

## 2024-05-05 ENCOUNTER — Ambulatory Visit: Payer: PRIVATE HEALTH INSURANCE

## 2024-05-06 ENCOUNTER — Ambulatory Visit: Payer: BLUE CROSS/BLUE SHIELD | Attending: Cardiovascular Disease

## 2024-05-06 ENCOUNTER — Ambulatory Visit: Payer: BLUE CROSS/BLUE SHIELD | Attending: Pediatric Gastroenterology

## 2024-05-06 ENCOUNTER — Inpatient Hospital Stay: Payer: BLUE CROSS/BLUE SHIELD | Attending: Cardiovascular Disease

## 2024-05-06 ENCOUNTER — Inpatient Hospital Stay: Payer: PRIVATE HEALTH INSURANCE

## 2024-05-06 ENCOUNTER — Ambulatory Visit: Payer: BLUE CROSS/BLUE SHIELD

## 2024-05-06 DIAGNOSIS — R001 Bradycardia, unspecified: Secondary | ICD-10-CM

## 2024-05-06 DIAGNOSIS — I495 Sick sinus syndrome: Secondary | ICD-10-CM

## 2024-05-06 DIAGNOSIS — Z8774 Personal history of (corrected) congenital malformations of heart and circulatory system: Secondary | ICD-10-CM

## 2024-05-06 DIAGNOSIS — Q204 Double inlet ventricle: Secondary | ICD-10-CM

## 2024-05-06 DIAGNOSIS — Z950 Presence of cardiac pacemaker: Secondary | ICD-10-CM

## 2024-05-06 DIAGNOSIS — R932 Abnormal findings on diagnostic imaging of liver and biliary tract: Principal | ICD-10-CM

## 2024-05-06 DIAGNOSIS — R945 Abnormal results of liver function studies: Secondary | ICD-10-CM

## 2024-05-06 DIAGNOSIS — Q201 Double outlet right ventricle: Secondary | ICD-10-CM

## 2024-05-06 MED ORDER — SPIRONOLACTONE 50 MG PO TABS
150 mg | ORAL_TABLET | Freq: Every day | ORAL | 3 refills | 30.00000 days | Status: AC
Start: 2024-05-06 — End: ?

## 2024-05-06 NOTE — Progress Notes
 OUTPATIENT HEPATOLOGY CONSULT  DATE OF SERVICE: 05/06/24  REFERRING MD: Dr. Aboulhosn  REASON FOR REFERRAL: Congenital Heart Disease, s/p Fontan, Evaluate for Fontan associated liver disease    HPI: Ann Duran is a 48 y.o. 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  Underwent modified Fontan procedure (patch connection of the IVC/SVC via the right atrium to MPA) at age 55 yrs at the [facility]  Postoperative chylothorax in1982, subsequently requiring exploration by a left thoracotomy, with ligation of lymphatic vessel and thoracic duct.  2008 was found to be in atrial fibrillation. Underwent cardioversion at [facility] by Dr. Franky Kirsch, along with cardiac cath.  Underwent Fontan conversion on 11/21/07, involving takedown of previous Fontan and placement of an extracardiac Fontan and bidirectional Marcey shunt and placement of epicardial permanent pacing leads. Discharged on 11/29/07  S/p surgical placement of abdominal pacemaker generator on 12/05/07.  Discharged on 12/12/07, after aggressive diuresis in the setting of significant ascites and perineal edema.  Subsequent diuresis over the next 6 weeks with resolution of ascites on moderate dose lasix  and moderate dose aldactone .  Underwent left hemidiaphragm plication by Dr. Belynda on 03/02/2009.  Liver US  in 07/2016 showed no lesions but METAVIR score was 4 consistent with cirrhosis.  Underwent cardiac cath on 08/08/17 by Dr. Orvilla, showing mean Fontan pressures of , and liver biopsy showing extensive bridging fibrosis, but no cirrhosis.  Spirinolactone dose was doubled from 25mg  daily to 50mg  daily in an attempt to reduce Fontan volume pressure.  Underwent pacemaker generator replacement ([employer]) on 12/12/17 by Dr. Belynda Salmons to [place]  for work in early 2020,.   06/01/2021  cardiac catheterization & Liver Biopsy  [facility], [place]  [facility]) mean fontan pressure ,  s/p closure of a persistent left sided superior vena cava draining into the coronary sinus with two amplatzer vascular plugs. Liver biopsy reportedly revealed focal bridging fibrosis.    INTERVAL EVENTS:  Last seen 08/2022 in hepatology clinic. Since that time, cardiology notes from Dr .Aboulhosn (whom she saw in [facility] clinic 12/2023) indicate:  -Seen urgently in [facility] clinic in 7/24 with increased abdominal distention due to increased salt intake, diuretics increased but had evidence of AKI therefore diuretic dosing was adjusted multiple times over the next month.  - EPS study performed by Dr. Kirsch on 12/09/22 showing ''Isolated atrial signal in localized anterior site (inferior near AVV annulus)- otherwise, no atrial capture with high-output pacing.'' Generator replaced on 12/13/23 by Dr. Arlyne. Following that procedure she experienced abd distension/ascites/ volume overload. Diuretics were transiently increased; farxiga  was added, and she was continued on sildenafil  and lisinopril .  She was referred to GI for gassiness.  - Had CT abd 9/24, no focal liver lesions  - Had labs 12/2023    Today she returns for [facility] and hepatology clinic, Echo, and Abd UTZ.      No change in energy or activity level. Walking, does [employer]- treadmill, running and [employer].  Still c/o bloating and abd swelling.   She has intermittent abd pain, gassiness; still awaiting visit with gastroenterologist. Has appointment with GI in [facility] later this month.  2-3 BMs per day. Typically needs to use restroom 15 min after meals. BMs are often soft/diarrheal. No BRB or melena.  Trying to limit salt intake.  Denies history of GI bleed, no heme positive stools.      Review of Systems:  A complete review  of systems was performed with pertinent positives noted above.    Past Medical History:   Diagnosis Date    Anxiety     controlled per pt    Cholelithiases     Noted on ultrasound, asymptomatic     Delayed emergence from general anesthesia 2010    pacemaker insertion, per patient    DORV (double outlet right ventricle)     GERD (gastroesophageal reflux disease)     well controlled w/med and diet    History of blood transfusion     HPV (human papilloma virus) infection     IUD (intrauterine device) in place     Mirena placed 12/27/10, replaced 12/2015, replaced November 16, 2016    Pacemaker     in abd    Post-operative nausea and vomiting     Sick sinus syndrome (HCC/RAF)        Past Surgical History:   Procedure Laterality Date    CARDIAC CATHETERIZATION      CERVICAL BIOPSY  W/ LOOP ELECTRODE EXCISION  11/16/2016    Also, Mirena IUD replacement as well as excision of vulvar skin    Diaphragmatic plication  2010    ECHOCARDIOGRAPHY TRANSESOPHAGEAL      Exam under anesthesia; hysteroscopy; dilation and curettage; placement of a Mirena intrauterine device  12/27/2010    FONTAN PROCEDURE  1982    Modified    INSERT / REPLACE / REMOVE PACEMAKER  2009    Revise Fontan, Maze procedure    MAZE PROCEDURE       Medications that the patient states to be currently taking   Medication Sig    aspirin  81 mg EC tablet Take 1 tablet (81 mg total) by mouth daily.    clonazePAM 0.5 mg tablet Take 1 tablet (0.5 mg total) by mouth.    furosemide  80 mg tablet Please take Furosemide  80mg  in the morning and 40mg  in the afternoon (Patient taking differently: Take 0.5 tablets (40 mg total) by mouth two (2) times daily. Please take Furosemide  80mg  in the morning and 40mg  in the afternoon)    ketoconazole 2% cream  (Patient not taking: Reported on 05/06/2024)    lisinopril  2.5 mg tablet Take 1 tablet (2.5 mg total) by mouth daily.    PANTOPRAZOLE  20 mg DR tablet TAKE 1 TABLET BY MOUTH DAILY AS  NEEDED    potassium chloride  10 MEQ tablet Take 1 tablet (10 mEq total) by mouth daily. (Patient taking differently: Take 1 tablet (10 mEq total) by mouth as needed for. Every 3 days)    sildenafil  20 mg tablet Take 1 tablet (20 mg total) by mouth three (3) times daily. [DISCONTINUED] dapagliflozin  (FARXIGA ) 10 mg tablet Take 1 tablet (10 mg total) by mouth daily.    [DISCONTINUED] spironolactone  50 mg tablet Take 3 tablets (150 mg total) by mouth daily. 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 Lives part of the month in [place]; frequently travels to [facility] and [address]  [address] for fundraising. Parents live in [facility]    Physical Exam:  BP 119/74  ~ Pulse 86  ~ Temp 36.4 ?C (97.5 ?F) (Tympanic)  ~ Ht 1.646 m (5' 4.8'')  ~ Wt 61.9 kg (136 lb 7.4 oz)  ~ BMI  22.85 kg/m?   Wt Readings from Last 3 Encounters:   05/06/24 61.7 kg (136 lb)   05/06/24 61.9 kg (136 lb 6.4 oz)   05/06/24 61.9 kg (136 lb 7.4 oz)     Ht Readings from Last 3 Encounters:   05/06/24 1.626 m (5' 4'')   05/06/24 1.626 m (5' 4'')   05/06/24 1.646 m (5' 4.8'')     Gen- NAD, Alert and interactive  HEENT- NCAT, PERRL; no icterus  CHEST- No G/F/R; CTAB  CV- RRR, + M  ABD- S/NT/ND/No HSM  EXT- WWP; No edema  SKIN- No rashes, Lesions or jaundice    Labs:   No visits with results within 1 Day(s) from this visit.   Latest known visit with results is:   Office Visit on 12/18/2023   Component Date Value Ref Range Status    White Blood Cell Count ([facility]) 12/31/2023 3.4 (L)  3.8 - 10.8 Thousand/uL Final    Red Blood Cell Count ([facility]) 12/31/2023 4.41  3.80 - 5.10 Million/uL Final    Hemoglobin ([facility]) 12/31/2023 12.4  11.7 - 15.5 g/dL Final    Hematocrit ([facility]) 12/31/2023 37.7  35.0 - 45.0 % Final    MCV ([facility]) 12/31/2023 85.5  80.0 - 100.0 fL Final    MCH ([facility]) 12/31/2023 28.1  27.0 - 33.0 pg Final    MCHC ([facility]) 12/31/2023 32.9  32.0 - 36.0 g/dL Final    Comment: For adults, a slight decrease in the calculated MCHC  value (in the range of 30 to 32 g/dL) is most likely  not clinically significant; however, it should be  interpreted with caution in correlation with other  red cell parameters and the patient's clinical  condition.      RDW ([facility]) 12/31/2023 13.0  11.0 - 15.0 % Final    Platelet Count ([facility]) 12/31/2023 112 (L)  140 - 400 Thousand/uL Final    MPV ([facility]) 12/31/2023 12.1  7.5 - 12.5 fL Final    [place] 19-9 ([facility]) 12/31/2023 10  <34 U/mL Final    Comment:    This test was performed using the Siemens   chemiluminescent method. Values obtained from  different assay methods cannot be used  interchangeably 19-9 levels, regardless of  value, should not be interpreted as absolute  evidence of the presence or absence of disease.         Alpha Fetoprotein, Tumor Marker (Q* 12/31/2023 3.4  ng/mL Final    Comment: Reference Range:   <6.1  The use of AFP as a tumor marker in pregnant  females is not recommended.        This test was performed using the Beckman Coulter  chemiluminescent method. Values obtained from  different assay methods cannot be used  interchangeably. AFP levels, regardless of  value, should not be interpreted as absolute  evidence of the presence or absence of disease.         Hemoglobin A1c ([facility]) 12/31/2023 5.6  <5.7 % of total Hgb Final    Comment: For the purpose of screening for the presence of  diabetes:     <5.7%       Consistent with the absence of diabetes  5.7-6.4%    Consistent with increased risk for diabetes              (prediabetes)  > or =6.5%  Consistent with diabetes     This assay result is consistent with a decreased risk  of diabetes.  Currently, no consensus exists regarding use of  hemoglobin A1c for diagnosis of diabetes in children.     According to American Diabetes Association (ADA)  guidelines, hemoglobin A1c <7.0% represents optimal  control in non-pregnant diabetic patients. Different  metrics may apply to specific patient populations.   Standards of Medical Care in Diabetes(ADA).          Glucose ([facility]) 12/31/2023 98  65 - 139 mg/dL Final    Comment:           Non-fasting reference interval Urea Nitrogen (BUN) ([facility]) 12/31/2023 25  7 - 25 mg/dL Final    Creatinine ([facility]) 12/31/2023 1.02 (H)  0.50 - 0.99 mg/dL Final    eGFR 97/74/7974 69  > OR = 60 mL/min/1.85m2 Final    BUN/Creatinine Ratio ([facility]) 12/31/2023 25 (H)  6 - 22 (calc) Final    Sodium ([facility]) 12/31/2023 136  135 - 146 mmol/L Final    Potassium ([facility]) 12/31/2023 4.2  3.5 - 5.3 mmol/L Final    Chloride ([facility]) 12/31/2023 99  98 - 110 mmol/L Final    Carbon Dioxide ([facility]) 12/31/2023 27  20 - 32 mmol/L Final    Calcium ([facility]) 12/31/2023 9.9  8.6 - 10.2 mg/dL Final    Protein, Total ([facility]) 12/31/2023 8.2 (H)  6.1 - 8.1 g/dL Final    Albumin ([facility]) 12/31/2023 5.3 (H)  3.6 - 5.1 g/dL Final    Globulin ([facility]) 12/31/2023 2.9  1.9 - 3.7 g/dL (calc) Final    Albumin/Globulin Ratio ([facility]) 12/31/2023 1.8  1.0 - 2.5 (calc) Final    Bilirubin, Total ([facility]) 12/31/2023 0.9  0.2 - 1.2 mg/dL Final    Alkaline Phosphatase ([facility]) 12/31/2023 78  31 - 125 U/L Final    AST ([facility]) 12/31/2023 19  10 - 35 U/L Final    ALT ([facility]) 12/31/2023 16  6 - 29 U/L Final           Imaging:   UTZ 05/06/24  Pancreas: Not visualized due to overlying bowel gas.     Liver: Lobulated liver surface contour with coarsened parenchymal echotexture.     Focal liver lesions: There is a subcapsular 4 mm hyperechoic focus of the right liver, new compared to 05/16/2022. Right and left lobe 7 mm hyperechoic foci sene on OSH ultrasound again seen (image 15- frame 27)     Portal vein: Normal, hepatopetal flow.     Gallbladder: Normally distended, containing echogenic sludge and layering gallstones. No gallbladder wall thickening. No sonographic Murphy's sign.     Bile ducts: Normal in caliber.     Kidneys: Normal in size with normal cortical thickness and echogenicity. No hydronephrosis.     Spleen: Markedly enlarged. Likely adjacent accessory spleen versus splenule.     Ascites: None in the upper abdomen.     Visualized proximal aorta and IVC: Unremarkable. MEASUREMENTS:  Liver: 13.4 cm  Common bile duct: 4.4 mm  Right kidney: 11.8 cm  Left kidney: 11.1 cm  Spleen: 18.1 cm  Aorta: 1.5 cm  IMPRESSION:     1.  Coarsened hepatic echotexture with lobulated surface contour suggestive of Fontan associated liver disease.      2.  Several hyperechoic foci are seen, including a new subcapsular focus of the right liver and two recently seen right and left hepatic foci on OSH US . Consider MRI/CT liver mass protocol for further characterization.     3.  Cholelithiasis without evidence of cholecystitis.     4.  Splenomegaly suggesting portal  hypertension.    CT Abd 9/24  IMPRESSION:     Hepatic congestion with sequela portal hypertension including portosystemic collaterals, splenomegaly, and small volume ascites. No focal enhancing mass lesion.     Cholelithiasis, with diffusely thickened gallbladder wall likely in part due to congestion and/or underdistention.    UTZ 04/2022  IMPRESSION:     1. Coarsening of hepatic echotexture with irregularity in surface contour suggesting fibrosis/cirrhosis. No suspicious liver lesions.     2. Cholelithiasis without evidence of cholecystitis.     3. Splenomegaly suggesting portal hypertension.     PATHOLOGY:  06/01/2021 [place]  [facility])  Congestive hepatopathy  Focal bridging fibrosis     08/2017  LIVER (TRANSJUGULAR BIOPSY):  - Congestive hepatopathy with extensive bridging fibrosis    A/P: Ann Duran is a 48 y.o. 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  s/p Fontan procedure at 48 y/o at the [facility].      Reviewed in detail the pathophysiology, clinical course, long-term issues (including risk of fibrosis, cirrhosis and HCC), and monitoring for fontan associated liver disease  Recommend CMP, GGT, CBC with plts, INR, AFP Q12 months. Last 2/25.     UTZ of the liver reviewed from today; new 4 mm lesion noted; other lesions stable. Recommend CT with contrast fall 2025 (last 9/24) to evaluate for focal lesions. Ordered today; patient given the phone number to call to schedule  No need for liver biopsy at this time (as she has had 2 biopsies in the past 5 years; last summer 2022)   Counseling re healthy diet and exercise, EtOH avoidance and limit Acetaminophen  exposure to avoid a 2nd hit to the liver.      45 minutes spent with the patient today    Follow up with Dr. Aboulhosn, f/u in hepatology clinic in 1 year

## 2024-05-06 NOTE — Progress Notes
 [facility]     Date of Visit: 05/06/2024   Reason for Visit: Follow up s/p cardiac catheterization on 08/08/17, in setting of DORV s/p Fontan conversion to extracardiac baffle on 11/21/07, and subsequent placement of epicardial permanent pacemaker.     HPI: Ann Duran is a 48 y.o. 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  Underwent modified Fontan procedure (patch connection of the IVC/SVC via the right atrium to MPA) at age 20 yrs at the [facility].  Postoperative chylothorax in1982, subsequently requiring exploration by a left thoracotomy, with ligation of lymphatic vessel and thoracic duct.  Suffered fatigue and exercise intolerance in November 2008, and was found to be in atrial fibrillation.  Underwent cardioversion at [facility] by Dr. Franky Kirsch on 10/14/07, along with cardiac cath which revealed excellent RA/Fontan pressures with a mean of 12-17mmHg.  Underwent Fontan conversion on 11/21/07, involving takedown of previous Fontan and placement of an extracardiac Fontan and bidirectional Marcey shunt and placement of epicardial permanent pacing leads. Discharged on 11/29/07  Re-admitted on 12/03/07 for thoracentesis of large right pleural effusion, in the setting of bradycardia and junctional rhythm  Underwent surgical placement of abdominal pacemaker generator on 12/05/07, but atrial lead was non-capturing, left with single site ventricular pacing.  Discharged on 12/12/07, after aggressive diuresis in the setting of significant ascites and perineal edema.  Subsequent diuresis over the next 6 weeks with resolution of ascites on moderate dose lasix  and moderate dose aldactone .  Underwent left hemidiaphragm plication by Dr. Belynda on 03/02/2009, unsuccessful attempt at placement of an atrial lead.  PPM rate set at VVI 90 bpm during the hospitalization, reduced to 60 bpm by Dr. Kirsch on 03/29/2009.  In the setting of poor chronotropic response to exercise, rate response activated by Dr. Kirsch in July 2011 with significantly improved exercise ability.  Underwent placement of Mirena IUD and D & C by Dr. Montel Parvatenini in February 2012. No cardiovascular complications.  Mirena IUD replaced with LEEP procedure in January 2018  Good functional capacity with MVO2 of 24.8 (88% predicted) in August 2016.  Low normal single RV function, improves with exercise.  No exercise induced arrhythmias  V-paced 99% of time at rate of 70, generator nearing ERI as of May 2018 remote check, requiring monthly checks to detect ERI.   Liver US  in Sept 2017 showed no lesions but METAVIR score was 4 consistent with cirrhosis.  Underwent cardiac cath on 08/08/17 by Dr. Orvilla, showing mean Fontan pressures of , and liver biopsy showing extensive bridging fibrosis, but no cirrhosis.  Spirinolactone dose was doubled from 25mg  daily to 50mg  daily in an attempt to reduce Fontan volume pressure.  Underwent pacemaker generator replacement ([employer]) on 12/12/17 by Dr. Belynda Salmons to [place]  for work in early 2020, had issues with the humidity, insomnia and had some exertional decline.   06/01/2021  cardiac catheterization & Liver Biopsy  by Dr. 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   Seen urgently in [facility] clinic in 7/24 with increased abdominal distention due to increased salt intake, diuretics increased but had evidence of AKI therefore diuretic dosing was adjusted multiple times over the next month.  EPS study performed by Dr. Kirsch on 12/09/22 showing ''Isolated atrial signal  in localized anterior site (inferior near AVV annulus)- otherwise, no atrial capture with high-output pacing.'' Generator replaced on 12/13/23 by Dr. Arlyne.    Interval History:  She is feeling well overall with the exception of feeling bloated after every meal.  She was seen by Dr Henriette today who ordered a CTA of the abdomen/pelvis.  She denies any lower ext edema.  No change in exertional capacity, still able to walk on flat ground without difficulty.  No other complaints today.  She is seeing a GI specialist this week.  She does not think the Farxiga  that was started 6 months ago has made her feel any different.    Past Medical History: As above.  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 Mickiel here at [facility].  LEEP in Jan 2018.  Mirena IUD replaced in Jan 2018.  Nose bleeds s/p bipolar cauterization 10/2016    Allergies:   Allergies   Allergen Reactions    Other         Medications that the patient states to be currently taking   Medication Sig    amoxicillin 500 mg tablet     Ascorbic Acid (VITAMIN C) 1000 MG tablet Take 1 tablet (1,000 mg total) by mouth as needed for.    aspirin  81 mg EC tablet Take 1 tablet (81 mg total) by mouth daily.    Cholecalciferol (VITAMIN D) 125 MCG (5000 UT) CAPS Take by mouth.    citalopram 20 mg tablet Take 1 tablet (20 mg total) by mouth.    clonazePAM 0.5 mg tablet TAKE 1 TABLET(0.5 MG) BY MOUTH AT NIGHT AS NEEDED FOR INSOMNIA    dapagliflozin  (FARXIGA ) 10 mg tablet Take 1 tablet (10 mg total) by mouth daily.    furosemide  80 mg tablet Please take Furosemide  80mg  in the morning and 40mg  in the afternoon (Patient taking differently: Take 0.5 tablets (40 mg total) by mouth two (2) times daily. Please take Furosemide  80mg  in the morning and 40mg  in the afternoon)    levonorgestrel (MIRENA, 52 MG,) 20 mcg/day IUD by Intrauterine route.    lisinopril  2.5 mg tablet Take 1 tablet (2.5 mg total) by mouth daily.    PANTOPRAZOLE  20 mg DR tablet TAKE 1 TABLET BY MOUTH DAILY AS  NEEDED    potassium chloride  10 MEQ tablet Take 1 tablet (10 mEq total) by mouth daily. (Patient taking differently: Take 1 tablet (10 mEq total) by mouth as needed for. Every 3 days)    sildenafil  20 mg tablet Take 1 tablet (20 mg total) by mouth three (3) times daily.    spironolactone  50 mg tablet Take 3 tablets (150 mg total) by mouth daily. (Patient taking differently: Take 2 tablets (100 mg total) by mouth daily.)    valacyclovir 500 mg tablet ValACYclovir HCl - 500 MG Oral Tablet    zolpidem 5 mg tablet Take 0.5 tablets (2.5 mg total) by mouth at bedtime as needed for Sleep.       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  Nonsmoker, occas ETOH.    Family History: Mother and father in their 14s in good health. Siblings, she has1 brother who is in good health. There is no history of congenital heart disease in the family.    ROS: Negative and noncontributory except as noted above in HPI and Interval Events.     Physical Exam:  VS: BP 106/67 (  BP Location: Left arm, Patient Position: Sitting, Cuff Size: Regular)  ~ Pulse 72  ~ Temp 36.5 ?C (97.7 ?F) (Forehead)  ~ Resp 18  ~ Ht 5' 4'' (1.626 m)  ~ Wt 136 lb 6.4 oz (61.9 kg)  ~ SpO2 95%  ~ BMI 23.41 kg/m?    General: Pleasant woman, in NAD.  Skin: No rashes. Sternotomy and left thoracotomy scars well healed.  Head and Neck: Pupils are equal and reacting.   Mouth: Normal oral mucosa. Excellent dentition.  Neck: Jugular venous pressure difficult to estimate.  Chest:  Lungs clear bilaterally   Heart: Single S1 with no systolic murmurs or rubs. No diastolic murmurs or gallops.  Abdomen: Mildly distended, no fluid wave or ascites. Soft, non-tender.  Tympanitic.  Extremities: No edema on legs.   Neurologic: Alert and oriented x 4.  Psychologic: Normal mood and affect.    Cardiac Diagnostic Data:    TTE 05/06/24:  1. L-transposition of the great arteries.   2. 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   3. 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 Low normal systemic RV systolic function.   4. Large secundum ASD. Status post extracardiac Fontan. Visualized portions of the Fontan appear patent, no obvious fenestration noted. Marcey shunt appears normal.   5. A prior echo performed on 05/26/1973 was reviewed for comparison. No significant changes noted since the previous study.    Liver US  05/06/24:  1.  Coarsened hepatic echotexture with lobulated surface contour suggestive of Fontan associated liver disease.   2.  Several hyperechoic foci are seen, including a new subcapsular focus of the right liver and two recently seen right and left hepatic foci on OSH US . Consider MRI/CT liver mass protocol for further characterization.  3.  Cholelithiasis without evidence of cholecystitis.  4.  Splenomegaly suggesting portal hypertension.      Liver US  at OSH 12/31/23:  Hepatic cirrhosis with marked splenomegaly. A few subcentimeter hyperechoic hepatic lesions are present. Consider repeat surveillance ultrasound in 3-6 months.     Echo 05/27/23:  1. Double out right ventricle with hypoplastic LV and large VSD with ''  functional single ventricle'' Right ventricular hypertrophy. Low normal  systemic RV systolic function.  2. 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  3. Large secundum ASD. Status post extracardiac Fontan. Visualized portions of  the Fontan appear patent, no obvious fenestration noted. Marcey shunt appears  normal.  4. L-transposition of the great arteries.  5. A prior echo performed on 05/25/2022 was reviewed for comparison. No  significant changes noted since the previous study.      Abdominal Ultrasound 05/25/2022  CLINICAL HISTORY: s/p Fontan, please assess for nodules, lesions and absesses.   COMPARISON: Abdominal ultrasound dated 05/23/2018.   TECHNIQUE: Real time grayscale and color Doppler images of the abdominal organs were obtained using a curved transducer.   FINDINGS:   Pancreas: Partially visualized and unremarkable.   Liver: Coarse in echotexture with irregular surface contour.   Focal liver lesions: None.   Portal vein: Normal, hepatopetal flow.   Gallbladder: Normally distended, containing gallstones. No gallbladder wall thickening. No sonographic Murphy's sign.   Bile ducts: Normal in caliber.   Kidneys: Normal in size with normal cortical thickness and echogenicity. No hydronephrosis.   Spleen: Splenomegaly.   Ascites: None in the upper abdomen.   Visualized proximal aorta and IVC: Unremarkable.   MEASUREMENTS: Liver: 14.6 cm Common Duct: 4.8 mm  Right Kidney: 11.5 cm Left Kidney: 12.1 cm Spleen: 17.8 cm Aorta: 1.6 cm      IMPRESSION:    1. Coarsening of hepatic echotexture with irregularity in surface contour suggesting fibrosis/cirrhosis. No suspicious liver lesions.    2. Cholelithiasis without evidence of cholecystitis.    3. Splenomegaly suggesting portal hypertension.     Echocardiogram 05/25/2022  PHYSICIAN INTERPRETATION: CONOTRUNCAL ANATOMY: The cardiac structural malformations are consistent with a diagnosis of levo-transposition of the great arteries. LEFT VENTRICLE: Patient demonstrated normal sinus rhythm during echocardiogram. 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 RIGHT ATRIUM: Large secundum ASD. Status post extracardiac Fontan. Visualized portions of the Fontan appear patent, no obvious fenestration noted. Marcey shunt appears widely patent with low velocity flow noted. MITRAL VALVE: Trace mitral valve regurgitation. TRICUSPID VALVE: Trace tricuspid valve regurgitation. AORTIC VALVE: No evidence of aortic valve regurgitation. AORTA: No coarctation of the aorta. PERICARDIUM: There is no evidence of pericardial effusion. CONCLUSIONS  1. 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  2. 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 Low normal systemic RV systolic function.  3. Large secundum ASD. Status post extracardiac Fontan. Visualized portions of the Fontan appear patent, no obvious fenestration noted. Marcey shunt appears normal.  4. L-transposition of the great arteries.  5. A prior echo performed on 08/09/2020 was reviewed for comparison. No significant changes noted since the previous study.    06/01/2021 Cardiac Catheterization [facility]- Dr. Oneil Giovanni                 Liver Biopsy 06/01/2021 [facility]- Dr. Oneil Giovanni       Echocardiogram 08/09/20- reviewed by me:  1. 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   2. Double out right ventricle with hypoplastic LV and large VSD with'' functional single ventricle'' Right ventricular hypertrophy. Low normal systemic RV systolic function.   3. Large secundum ASD. Status post extracardiac Fontan. Visualized portions of the Fontan appear patent, no obvious fenestration noted. Marcey shunt appears normal.   4. L-transposition of the great arteries.   5. Large secundum ASD. Status post extracardiac Fontan. Visualized portions of the Fontan appear patent, no obvious fenestration noted. Marcey shunt appears widely patent with low velocity flow noted.   6. A prior echo performed on 07/29/2019 was reviewed for comparison. No significant changes noted since the previous study.    Pacemaker remote transmission 05/07/20:  4.9 years longevity, VVIR mode, device function normal, one episode of possible NSVT (3 beats) on 6/12 after walking 5K, asymptomatic, 99% V pacing.    Echocardiogram 07/29/2019:  1. L-transposition of the great arteries.  2. 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 Low normal systemic RV systolic function.  3. Double outlet right ventricle-doubly commited ventricular septal defect-aorta anterior to pulmonary artery-no left ventricular outflow tract obstruction.  4. 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  5. Large secundum ASD. Status post extracardiac Fontan. Visualized portions of the Fontan appear patent, no obvious fenestration noted. Marcey shunt appears normal.  6. A prior echo performed on 05/13/2018 was reviewed for comparison. No significant changes noted since the previous study.    CAM  Monitor 04/12/17:  Predominant rhythm: Paced   Paced rhythm   Ventricular Ectopy = (<1%)  1802 isolated, unifocal PVCs and 208 pairs   Atrial Ectopy = (<1%)  1 isolated PAC  Predominantly V paced rhythm with rare supraventricular beats  (most are labeled PVC's). Symptoms of ''Chest discomfort'  correlated with 2 narrow complex beats/. Symptoms of  Dizziness/Lightheadedness correlated with ventricular pacing.  One of four button presses correlated with a narrow complex  beat, the remaining button presses correlated with ventricular  pacing.    Echocardiogram:  05/13/18  PHYSICIAN INTERPRETATION:  CONOTRUNCAL ANATOMY: The cardiac structural malformations are consistent with a diagnosis of levo-transposition of the great arteries.  LEFT VENTRICLE: Patient demonstrated normal sinus rhythm during echocardiogram. 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  RIGHT VENTRICLE: Double out right ventricle with hypoplastic LV and large VSD with '' functional single ventricle'' Right ventricular hypertrophy. Low normal systemic RV systolic function.  LEFT ATRIUM:  RIGHT ATRIUM: Right atrial pressure is estimated at 8 mmHg. Right atrial pressure is estimated at 8 mmHg. Large secundum ASD. Status post extracardiac Fontan. Visualized portions of the Fontan appear patent, no obvious fenestration noted. Marcey shunt   appears normal.  MITRAL VALVE: No evidence of mitral valve regurgitation.  TRICUSPID VALVE: Trace tricuspid valve regurgitation.  AORTIC VALVE: The aortic valve was not well visualized. Aortic valve area was not quantified by continuity equation on this study. Aorta is anterior of the pulmonic valve.  No evidence of aortic regurgitation.  PULMONIC VALVE: No indication of pulmonic valve regurgitation. Pulmonic is posterior of the aorta.  AORTA: Normal aortic arch, normal descending aorta, normal mid ascending aorta and the aortic root is normal in size and structure. No coarctation of the aorta.  PULMONARY ARTERY: The pulmonary artery is not well seen.  SYSTEMIC VEINS: The inferior vena cava is normal in size and exhibits less than 50% respiratory change.  PERICARDIUM: There is no evidence of pericardial effusion.  CONCLUSIONS:   1. L-transposition of the great arteries.   2. 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 Low normal systemic RV systolic function.   3. Double outlet right ventricle-doubly commited ventricular septal defect-aorta anterior to pulmonary artery-no left ventricular outflow tract obstruction.   4. 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   5. Large secundum ASD. Status post extracardiac Fontan. Visualized portions of the Fontan appear patent, no obvious fenestration noted. Marcey shunt appears normal.   6. A prior echo performed on 05/07/2017 was reviewed for comparison. No significant changes noted since the previous study.    Stress Echo/CPX:  05/13/18  BASELINE ECG:   Ventricular paced with biphasic T wave in lead V2  PROTOCOL: Bruce. (Treadmill advanced to stage 3 on its own during stage 2 of exercise after 1')  CONTROL:   HR:  72    BP: 110/64     O2 Sat:  98% (forehead) 92% (finger)   START:  1.7 MPH at 10% grade.   STOP: 11.4 METS with 3.4 mph at 14% grade x 2?37 after 7?37'' total exercise time due to shortness of breath.  Peak HR: 129      Peak BP: 128/66         Peak O2 Sat:  94%   Peak Double-Product: 16,512    Maximum VO2 = 1391 ml which is 77% of predicted, maximum VO2/kg = 23.7 ml/kg which is 85% of predicted (Note: The reference values are adjusted for weight and modality of exercise)  VE/VO2 = 40   VE/VCO2 = 36      VE/VCO2 slope = 30.1  VO2/HR =11.1 which is 111% predicted  Breathing  Reserve = 41  RQ= 1.12  Anaerobic Threshold was reached at HR = 103 VO2 =  1144 ml     VO2/kg =  19.5 ml/kg  RESULTS:   Symptoms:  Shortness of breath and leg fatigue, denied chest pain  ST-T Changes: Uninterruptable in the presence of ventricular pacing.  Dysrhythmias:  Rare PVCs during exercise  BP Response:  Blunted.  IMPRESSIONS: Fair exercise tolerance achieving 71% maximum predicted heart rate, limited by shortness of breath. ST changes during exercise are uninterpretable in the presence of ventricular pacing. No chest pain. Rare PVCs during exercise. Blunted BP at a low double product. Maximum VO2/kg = 23.7 ml/kg which is 85% of predicted. VE/VCO2 slope = 30.1 which is 119% of predicted. Compared with previous stress/CPX of 06/21/15 exercise tolerance has decreased from 12.7 METS, MVO2/kg had been 24.8, VE/VCO2 slope had been 23.1.  BASELINE:  Baseline: Single ventricle with low normal systolic function.  ADDITIONAL BASELINE FINDINGS:  LEFT VENTRICLE: Global left ventricular systolic function is mildly decreased (LVEF 40-49%).  MITRAL VALVE: Trace mitral valve regurgitation.  TRICUSPID VALVE: Trace tricuspid valve regurgitation.     Global left ventricular function increased appropriately with stress. No new segmental wall motion abnormalities were seen. Global left ventricular systolic function at peak stress is lower limits of normal (LVEF 50-55%). Mild mitral valve regurgitation.   Mild tricuspid regurgitation. Post-exercise: mild increase in single ventricle contractility.     SUMMARY:   1. Please see separately resulted cardiopulmonary exercise test for details of exercise performance.   2. DORV, L-TGA, hypoplastic LV, large VSD, large ASD.   3. Baseline: Single ventricle with low normal systolic function.   4. Post-exercise: mild increase in single ventricle contractility    Liver US :  05/13/18  IMPRESSION:  1. Irregular liver contour and coarsened in echotexture representing underlying chronic liver disease/fibrosis. No focal mass. Cholelithiasis. Splenomegaly supports portal hypertension. Normal liver Doppler.  2. Shear wave liver stiffness measurement indicating moderate to severe fibrosis (MetaVir stages F2-F3). Note, the morphologic appearance of the liver appears more fibrotic than Elastography measurements indicate.    Cardiac Cath:  08/08/17:      Liver US   07/01/17 (outside report) shows no lesions, Metavir score of 4.    Echocardiogram:  05/07/17 PHYSICIAN INTERPRETATION:  CONOTRUNCAL ANATOMY: The cardiac structural malformations are consistent with a diagnosis of levo-transposition of the great arteries.  LEFT VENTRICLE: Visually estimated left ventricular ejection fraction 55-60%. Patient demonstrated normal sinus rhythm during echocardiogram. 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  RIGHT VENTRICLE: Double out right ventricle with hypoplastic LV and large VSD with '' functional single ventricle'' Right ventricular hypertrophy. Low normal systemic RV systolic function.  RIGHT ATRIUM: Right atrial pressure is estimated at 8 mmHg. Right atrial pressure is estimated at 8 mmHg. Large secundum ASD. Status post extracardiac Fontan. Visualized portions of the Fontan appear patent, no obvious fenestration noted. Marcey shunt   appears normal.  MITRAL VALVE: No evidence of mitral valve regurgitation.  TRICUSPID VALVE: Mild tricuspid valve regurgitation.  AORTIC VALVE: The aortic valve was not well visualized. Aorta is anterior of the pulmonic valve.  No evidence of aortic regurgitation.  PULMONIC VALVE: No indication of pulmonic valve regurgitation. Pulmonic is posterior of the aorta.  AORTA: Normal aortic arch and normal descending aorta. The ascending aorta was not well visualized. Mildly enlarged aortic root (4.2 cm). No coarctation of the aorta.  PULMONARY ARTERY: The pulmonary artery is not well seen.  SYSTEMIC VEINS: The inferior vena cava  is normal in size and exhibits less than 50% respiratory change.  PERICARDIUM: There is no evidence of pericardial effusion.  CONCLUSIONS:   1. L-transposition of the great arteries.   2. Double outlet right ventricle-doubly commited ventricular septal defect-aorta anterior to pulmonary artery-no left ventricular outflow tract obstruction.   3. 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 Low normal systemic RV systolic function.   4. Large secundum ASD. Status post extracardiac Fontan. Visualized portions of the Fontan appear patent, no obvious fenestration noted. Marcey shunt appears normal.   5. Left ventricular ejection fraction is approximately 55-60%.   6. Mildly enlarged aortic root (4.2 cm).   7. Mild tricuspid valve regurgitation.   8. A prior echo performed on 03/09/2016 was reviewed for comparison. No significant changes noted since the previous study.   9. 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     ECG 11/07/16: Ventricular paced. Ventricular rate of 75 bpn, QRS duration 184 ms. QT/QTc 498/556 ms    Liver Ultrasound 07/27/16 per report from OSH: Liver 17.5 cm in length. It is normal in echodensity. Liver contour is smooth. No focal lesion os identified. Hepatopedal flow is identifies in the main, right, and left portal veins. Gallbladder and biliary tree: There are several small mobile gallstones. There is no focal tenderness. The gallbladder wall is thickened measuring 0.7 cm. There is no pericholecystic fluid. No intra or extrahepatic biliary ductal dilatation.      Echocardiogram 03/09/16: CONOTRUNCAL ANATOMY: The cardiac structural malformations are consistent with a diagnosis of levo-transposition of the great arteries. 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 The aorta is anterior to the pulmonary artery. There is no sub-aortic obstruction of left ventricular outflow tract. LEFT VENTRICLE: Normal left ventricular size. Visually estimated left ventricular ejection fraction 55-60%. Small left ventricular size. RIGHT VENTRICLE: (DTI 6.0 cm/s). 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 Low normal systemic RV systolic function. LEFT ATRIUM: Mild left atrial enlargement. RIGHT ATRIUM: Right atrial pressure is estimated at 8 mmHg. Large secundum ASD. Status post extracardiac Fontan. Visualized portions of the Fontan appear patent, no obvious fenestration noted. Marcey shunt appears normal. MITRAL VALVE: No evidence of mitral valve regurgitation. TRICUSPID VALVE: Mild tricuspid valve regurgitation. AORTIC VALVE: The aortic valve is trileaflet and structurally normal, with normal leaflet excursion. Aorta is anterior of the pulmonic valve. No evidence of aortic regurgitation. PULMONIC VALVE: No indication of pulmonic valve regurgitation. Pulmonic is posterior of the aorta. AORTA: Normal mid ascending aorta and normal aortic arch. Mildly enlarged aortic root (3.9 cm). SYSTEMIC VEINS: The inferior vena cava is normal in size and exhibits less than 50% respiratory change. PERICARDIUM: There is no evidence of pericardial effusion.    CONCLUSIONS:  1. 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 Low normal systemic RV systolic function.  2. Left ventricular ejection fraction is approximately 55-60%.  3. Double outlet right ventricle.  4. Double outlet right ventricle     -doubly commited ventricular septal defect     -aorta anterior to pulmonary artery     -no left ventricular outflow tract obstruction.  5. L-transposition of the great arteries.  6. Mild left atrial enlargement.  7. Large secundum ASD. Status post extracardiac Fontan. Visualized portions of the  Fontan appear patent, no obvious fenestration noted. Marcey shunt appears normal.  8. Mild tricuspid valve regurgitation.  9. Mildly enlarged aortic root (3.9 cm).    Abdominal US  06/21/15:  The pancreas is partially visualized and grossly unremarkable. The liver is mildly heterogeneous in echogenicity. There is no focal liver lesion. Portal vein demonstrates hepatopetal flow. No intra- or extrahepatic biliary dilation. The common bile duct is normal in caliber. The gallbladder is normally distended, containing gallstones. No gallbladder wall thickening or pericholecystic fluid. No sonographic Murphy's sign. The spleen is mildly enlarged. The kidneys are normal in size. Both kidneys demonstrate normal cortical thickness and echogenicity. No hydronephrosis. There is no ascites. The visualized proximal aorta and IVC are unremarkable. MEASUREMENTS: Liver: 15.1 cm Common Duct: 3 mm Right Kidney: 10.5 cm Left Kidney: 11.5 cm Spleen: 17 cm Aorta: 1.1 cm SHEAR WAVE LIVER STIFFNESS MEASUREMENTS: Mean 1.63 +/-0.12 m/sec, Median 1.66 m/sec, equating to 5.7-12 kPA. REFERENCE (m/s, kPa): Normal: 0.81-1.22 m/s 2.0-4.5 kPa (METAVIR F0) Normal/mild: 1.22-1.37 m/s 4.5-5.7 kPa (METAVIR F0-F1) Mild/moderate: 1.37-2 m/s 5.7-12.0 kPa (METAVIR F2-F3) Moderate/severe: 2-2.64 m/s 12.0-21.0 kPa (METAVIR F3-F4) Severe: >2.64 m/s >21.0 kPa (METAVIR F4) IMPRESSION: 1. Heterogenous liver echogenicity, suggesting diffuse liver disease. No suspicious liver lesions. Patent hepatic vasculature. 2. Shear wave liver stiffness measurement indicating mild/moderate liver fibrosis, MetaVir score of F2-3. 3. Splenomegaly. 4. Cholelithiasis without evidence of acute cholecystitis.    Echocardiogram 06/21/15:  2D AND M-MODE MEASUREMENTS (normal ranges within parentheses): Aorta/Left Atrium: Aortic Root, d (2D): 2.8 cm LV DIASTOLIC FUNCTION: MV Peak E: 9.36 m/s E/e' Ratio: 40.2 LV IVRT: 134 msec Decel Time: 229 msec Right Ventricle: TAPSE: 1.0 cm Aortic Valve: AoV Max Vel: 1.05 m/s AoV Peak PG: 4 mmHg AoV Mean PG: Tricuspid Valve and PA/RV Systolic Pressure: TR Max Velocity: 3.2 m/s RA Pressure: 8 mmHg RVSP/PASP: 49 mmHg Pulmonic Valve: PV Max Velocity: 0.8 m/s PV Max PG: 2 mmHg PV Mean PG: Aorta: Ao Asc: 2.4 cm. PHYSICIAN INTERPRETATION: CONOTRUNCAL ANATOMY: The cardiac structural malformations are consistent with a diagnosis of levo-transposition of the great arteries. 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 The aorta is anterior to the pulmonary artery. There is no sub-aortic obstruction of left ventricular outflow tract. LEFT VENTRICLE: Visually estimated left ventricular ejection fraction 55-60%. Small left ventricular size. RIGHT VENTRICLE: TAPSE 1.0 cm, (DTI 6.0 cm/s). 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 Low normal systemic RV systolic function. RIGHT ATRIUM: Right atrial pressure is estimated at 8 mmHg. Large secundum ASD. Status post extracardiac Fontan. Visualized portions of the Fontan appear patent, no obvious fenestration noted. Unable to obtain images of Glenn shunt. MITRAL VALVE: No evidence of mitral valve regurgitation. TRICUSPID VALVE: Mild tricuspid valve regurgitation. Tricuspid regurgitation velocity is not well seen. AORTIC VALVE: The aortic valve is trileaflet and structurally normal, with normal leaflet excursion. Aorta is anterior of the pulmonic valve. No evidence of aortic regurgitation. PULMONIC VALVE: No indication of pulmonic valve regurgitation. Pulmonic is posterior of the aorta. AORTA: The aortic root is normal in size and structure, normal mid ascending aorta and normal aortic arch. SYSTEMIC VEINS: The inferior vena cava is not well visualized. PERICARDIUM: There is no evidence of pericardial effusion. CONCLUSIONS: 1. Small left ventricular size 2. 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 3. L-transposition  of the great arteries. 4. Mild tricuspid valve regurgitation. 5. 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 Low normal systemic RV systolic function.     CPEX/Echo 06/11/15:  BASELINE: Low normal systemic RV systolic function at rest. Hypoplastic LV. ADDITIONAL BASELINE FINDINGS: MITRAL VALVE: Trace mitral valve regurgitation. TRICUSPID VALVE: Trace tricuspid valve regurgitation. No new segmental wall motion abnormalities were seen. Global left ventricular systolic function at peak stress is normal (LVEF 60-65%). Improved systemic RV systolic function with stress. No increase in degree of atrioventricular valve regurgitation with exercise. SUMMARY: 1. Improved systemic RV systolic function with stress. No increase in degree of atrioventricular valve regurgitation with exercise. 2. Low normal systemic RV systolic function at rest. Hypoplastic LV. BASELINE ECG: Ventricular paced rhythm PROTOCOL: Bruce. CONTROL: HR: 74 BP: 120/74 O2 Sat: 95 % START: 1.7 MPH at 10% grade. STOP: 12.7 METS with 4.2 mph at 16 % grade x 1?33'' after 10?33'' total exercise time due to shortness of breath. Peak HR: 141 Peak BP: 136/56 Peak O2 Sat: 95 % Peak Double-Product: 19,176 Maximum VO2 = 1386 ml which is 77 % of predicted, maximum VO2/kg = 24.8 ml/kg which is 88 % of predicted (Note: The reference values are adjusted for weight and modality of exercise) VE/VO2 = 35 VE/VCO2 = 31 VE/VCO2 slope = 23.1 VO2/HR = 9.8 which is 99 % predicted Breathing Reserve = 50 RQ= 1.13 Anaerobic Threshold was reached at HR = 103 VO2 = 1094 ml VO2/kg = 19.6 ml/kg RESULTS: Symptoms: Shortness of breath and leg fatigue; no chest pain or discomfort. ST-T Changes: Uninterpretable due to paced rhythm. Dysrhythmias: Rare PVC during exercise BP Response: Appropriate. IMPRESSIONS: Good exercise tolerance achieving 77 % maximum predicted heart rate, limited by shortness of breath. Exercise induced ST changes are uninterpretable due to paced rhythm. No exercise induced chest pain at a low double product. Rare PVCs as described above. Maximum VO2 = 1386 ml which is 77 % of predicted, maximum VO2/kg = 24.8 ml/kg which is 88 % of predicted . VE/VCO2 slope = 23.1 which is 92 % of predicted. Compared with previous stress/CPX of 04/24/13 maximum VO2/kg had been 25.0 at 11.9 METS    Echocardiogram:  04/21/13: CONCLUSIONS:  1. Double outlet right ventricle.  2. Moderately enlarged right ventricular size and low normal systolic function. 3. Moderately increased RV wall thickness. 4. Mild tricuspid regurgitation. 5. Status post extracardiac Fontan, appears widely patent, no fenestration noted. Marcey shunt not well visualized.    Labs:      12/31/23:  WBC 3.4, Hgb 12.4, Hct 38%, plts 112k, AFP 3.4, HgbA1C 5.6%, creat 1.02, BUN 25, alb 5.3, INR 1.1, GGT 88, LFTs normal,   07/04/23:  Na 131, K 5.2, creat 1.2, BUN 24, [place] 9.5, BNP 48  06/24/23:   Na 130, K 5.2, creat 1.25, BUN 30, Mg 2.3,   06/17/23:  Na 130, K 5.3, creat 1.08  05/25/23:  Na 135, K 4.9, creat 1.22, INR 1.2, LFTs acceptable, AFP 2.5, [place] 19-9 is 10  05/31/22:  WBC 3.3k, Hgb 13.2, plts 107k, INR 1.1, K 4.4, creat 0.86, TB 1, TP 8.2, AFP 3.6, [place] 19-9 14, prealbumin 27  05/02/2022 WBC 2.9 Hgb 13.1 Plts 90 Na 135  K 4.3 BUN 19 Cr 0.84 Alb 4.9 AST 24 ALT 19 ALP 74      Component 05/02/22 08/25/20 08/17/20 08/18/18   Auto WBC 2.9 Low  3.0 Low  2.8 Low  5.80   RBC 4.46 4.98 4.93 5.11   Hemoglobin 13.1 14.6 14.1 15.0   Hematocrit 38.7 43.5 43.0 44.5   MCV 87 87 87 87.1   MCH 29.4 29.3 28.6 29.4   MCHC 33.9 33.6 32.8 33.7   RDW 12.3 12.1 12.1 13.8   Platelets 90 Low Panic 90 101 Low   90 Low Panic   89 Low    Hematology Comments: Note:  Note:  Note:  --     08/18/18: WBC 5.8 RBC 5.11 Hgb 15 Hct 44.5 Plt 89 NA 139 K 5.3 BUN 16 Creat 0.81 Gluc 71 BNP 149 TSH 2.39  08/08/17: Creat 0.72, BUN 12, K 3.8  07/01/17:  Chol 121, trig 58, HDL 40, LDL 69, Hgb A1c 5.4, INR 1.2, prealbumin 24, TSH 3.6, Hct 43.2, Plat 82k  07/26/16: WBC 3.0, HGB 4.69, HCT 40.7, PLT 75, NA 137, K 3.8, BUN 13, CREAT 0.7, ALK PHOS 57, AST 30, ALT 38, TBil 1.3, GGT 100, PREALB 24, BNP 113, INR 1.2, TSH 4.24, FT3 3.6, FT4 1.05.  04/01/13:  Hgb 14.7, Hct 42.4, MCV 85.1, plat ct 106k, Na 143, K 4.2, creat 0.8, BUN 19, AST 36, ALT 32    Impressions:  DORV (although recent echos appear to show a DILV with single LVEF 50%, and earlier surgical notes describe her anatomy as DILV), L-malposed great arteries: s/p RA-PA anastomosis Fontan at age 56 years, now s/p Extracardiac Fontan and bidirectional Marcey shunt on 11/21/07 by Dr. Jannell Pitter, including bi-atrial Maze procedure and placement of a permanent atrial pacing lead.  Postoperative junctional rhythm and bradycardia: re-admitted 4 days after discharge with large right pericardial effusion requiring thoracentesis and placement of an abdominal pacemaker generator and epicardial ventricular lead on 12/05/07 (non-functioning atrial lead).  Ascites, improved on aldactone . No evidence of ascites on annual abdominal US .  Longstanding history of irregular and heavy menses, thus far unsuccessfully regulated on several types of hormonal therapy. Better controlled on Mirena since 2012.  Also has cervical HPV.  Now with CIN III, underwent LEEP procedure in Jan 2018  History of atrial fibrillation, but no post Fontan revision tachyarrhythmias thus far, formerly on coumadin (possible side effect of hair loss), now on ASA.  Longstanding elevated left hemidiapragm, s/p successful plication on 03/02/2009 by Dr. Pitter.  Liver US  in 2014, 2016 and 2017, and 2018 shows no lesions, but 2017 and 2018 outside liver US  report Metavir score of 4, which is consistent with cirrhosis.  2019 liver US  at [facility] showed MetaVir stage F2-F3, although elastography impacted by congestive hepatopathy.   Cardiac cath and liver biopsy on 08/08/17, with mean Fontan pressure under sedation of , and liver biopsy showing extensive bridging fibrosis but no cirrhosis.  Spirinolactone was increased from 25 to 50mg  to 100mg  after this cath  Ventricular pacemaker, pacing 99% of time.   Generator replaced on 12/12/17 by Dr. Pitter. Deferred placement of transvenous atrial lead via her LPA to minimize V pacing, based on Oluwatosin's wish not to be committed to anticoagulation  Stable exercise capacity in July 2019, with MVO2 23.7 (85% predicted), previously 24.8 and 88% predicted in 2016  PM generator change by Dr Arlyne 12/10/23    [facility]    Labs (BMP, BNP, LFTs, GGT, AFP, INR, CBC, Prealbumin):  [x]  Within last year (date: 05/25/2023)  [x]  Ordered today:   []  Deferred:      Hep C screening  [x]  Prior HCV Ab negative (date: 06/21/15)  []  Not  indicated (no surgery prior to early 1990s)  []  Ordered    Last hemodynamic cath:   [x]  Within last 10 years (date: 06/01/2021), see above results   []  Deferred. Reason:   []  Scheduled    Last Liver biopsy  [x]  Within last 10 years (date: 06/01/2021), see above results   []  Scheduled with cath    Liver Imaging:  []  MRI Abdomen within last 5 years (date: ), see above  []  Triple phase CT within last 5 years (date: ), see above  [x]  Liver ultrasound completed (date: 05/06/24), see above  [x]  Ordered today, see above.     Advanced cardiac imaging:  []  Cardiac MRI (date: ), see above  [x]  Cardiac CT (date: 12/12/2007), see above  [x]  Deferred. Reason: Pacemaker  []  Ordered today, see above     PDE-5 Inhibitor recommended:  [x]  On sildenafil  []  On tadalafil  []  Not tolerated. Previously on []  sildenafil  (Year: )  []  tadalafil (Year: )   Side effect:  []  Deferred. Reason:   []  Ordered today, see above    Echo  [x]  Within last year (date: 05/06/24)   []  Deferred. Reason:   []  Ordered today    Cardiopulmonary exercise test  [x]  Within last 3 years (date: 05/13/18), see above results  []  Deferred. Reason:   []  Ordered today    Advanced care planning  [x]  Date: Advanced directive in CC from 12/27/2010  []  Deferred. Reason:       Recommendations:   Continue lasix  80 mg daily alternating with 40 mg bid, she weighs 136 lbs today, down from 142 lbs earlier this year.  Discontinue Farxiga , she notes no symptomatic change on this Rx.  Otherwise, continue cardiac medications with no changes (sildenafil  20mg  TID, lisinopril  2.5mg ,)  Low sodium diet recommended.  Liver US  reviewed, plan for CTA, was seen by hepatology Dr Henriette today  Continued hepatology FU with Dr Henriette.   Increase aldactone  to 150 mg daily and check labs in one week  Continue daily ASA  Referred to GI given abdominal discomfort due to excess gas- seeing a GI specialist in the next week  FU with Dr Garnette Ahle who is an [facility] specialist she has seen previously at [facility]  [facility] who is now in private practice in [place], she should have a local specialist as well.  Telehealth visit in next 3-4 months to assess how she is doing

## 2024-05-06 NOTE — Patient Instructions
 Please call to schedule CT scan of the abdomen in the fall     For radiology studies, please call radiology scheduling: [phone].

## 2024-05-06 NOTE — Patient Instructions
-  Increase spironolactone  to 150 mg per day  -In one week please go to [facility] and get updated labs  -Stop farxiga  for the time being  -Telehealth FU in 3 months

## 2024-05-07 NOTE — Procedures
 SEE MEDIA SECTION FOR PROGRAMMER PRINTOUT   [facility] Pacemaker     PATIENT: Ann Duran  MRN: [id number]  DOB: 11/14/1975     DATE OF PROCEDURE: 05/06/2024  TIME OF PROCEDURE: 1415     INDICATIONS: Pacemaker evaluation   PROCEDURE: Pacemaker evaluation     Location: [facility] [place] [facility]     Technician: Jeralyn Row, NP     READING PHYSICIAN:  Dr. Franky Kirsch     Device:  Device Manufacturer: Medtronic  Device Model Name: Nelwyn HEWS DR MRI  Model Number: [id number]  Device Serial Number: [id number]  Device Implant Date: 12/13/2023  Indication for Implant:      RA lead: (Non Capturing / OFF)  Manufacturer: Medtronic   Model Number: [id number]  Serial Number: [id number]  Date of Implant: 12/05/2007     RV lead:  Manufacturer: Medtronic   Model Number: [id number]  Serial Number: [id number]  Date of Implant: 12/05/2007     Battery: 7.7 years     Underlying Rhythm: V rate in 50's   R wave: 19.5 mV     Impedance:  A: 399 ohms  V: 570 ohms     Capture threshold  V: 1.75 V @ 1.2 ms / 2.0V @ 1.36ms     Since 01/07/2024     Tachy episode summary:  VT/VF: 0      VP: 97%     Programmed changes:   Reduced RV output      Settings:   Mode: VVIR  Rate: Lower/Upper Sensor: 70/150 bpm  RV Output: 3 V @ 1.0 ms  RV Sensitivity: 2.0 mV     Assessment  1. 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 S/p Fontan conversion to extracardiac baffle on 11/21/07.  2. Underwent surgical placement of abdominal pacemaker generator on 12/05/07, but atrial lead was non-capturing, now with single site ventricular pacing. Generator change 12/12/2017 by Dr. Belynda. Generator change 12/13/2023 by Dr. Arlyne. Medtronic single chamber pacer with normal function.  3. Patient is not pacemaker dependent.   4. No ventricular high rate episodes.     Plan:  1. Patient seen today by Dr. Franky Kirsch, see separate EP note.  2. Programming changes: RV output changed to 1.57ms  3. Remote monitoring every 3 months.  4. Return for device evaluation and consultation with Dr. Georgina in 1 year, sooner for any symptoms.       Robynn Goldstein, NP

## 2024-05-15 ENCOUNTER — Inpatient Hospital Stay: Payer: BLUE CROSS/BLUE SHIELD | Attending: Pediatric Gastroenterology

## 2024-05-15 DIAGNOSIS — Q204 Double inlet ventricle: Secondary | ICD-10-CM

## 2024-05-15 DIAGNOSIS — R932 Abnormal findings on diagnostic imaging of liver and biliary tract: Secondary | ICD-10-CM

## 2024-05-15 DIAGNOSIS — Z8774 Personal history of (corrected) congenital malformations of heart and circulatory system: Secondary | ICD-10-CM

## 2024-05-15 DIAGNOSIS — R945 Abnormal results of liver function studies: Secondary | ICD-10-CM

## 2024-05-15 MED ADMIN — IOHEXOL 350 MG/ML IV SOLN: 100 mL | INTRAVENOUS | @ 19:00:00 | Stop: 2024-05-15 | NDC 00407141491

## 2024-05-21 ENCOUNTER — Ambulatory Visit: Payer: BLUE CROSS/BLUE SHIELD | Attending: Pediatric Gastroenterology

## 2024-05-24 ENCOUNTER — Other Ambulatory Visit: Payer: PRIVATE HEALTH INSURANCE

## 2024-05-25 NOTE — Telephone Encounter
 Labs reviewed  Meds reviewed  Notes reviewed

## 2024-05-25 NOTE — Telephone Encounter
 Patient will contact radiology to request images CT/US .

## 2024-05-28 ENCOUNTER — Other Ambulatory Visit: Payer: PRIVATE HEALTH INSURANCE

## 2024-06-10 ENCOUNTER — Telehealth: Payer: BLUE CROSS/BLUE SHIELD

## 2024-06-10 DIAGNOSIS — R109 Unspecified abdominal pain: Secondary | ICD-10-CM

## 2024-06-10 DIAGNOSIS — Q204 Double inlet ventricle: Secondary | ICD-10-CM

## 2024-06-10 DIAGNOSIS — Z8774 Personal history of (corrected) congenital malformations of heart and circulatory system: Secondary | ICD-10-CM

## 2024-06-10 DIAGNOSIS — I495 Sick sinus syndrome: Secondary | ICD-10-CM

## 2024-06-10 NOTE — Telephone Encounter
 Referral and last note faxed    Dr. DELENA had mentioned that he wanted me to establish care again with Garnette JAYSON Ahle, MD, [facility].  He is currently with [facility] in [place].  I spoke with the front office today, and they need you to send a referral over to them.  They need to understand if they have the right equipment needed for me and my CHD to take me on and follow me.    You can send the referral to their fax.  The fax number is below.  Please let me know if you have any questions.     Fax:  [phone]

## 2024-06-11 ENCOUNTER — Other Ambulatory Visit: Payer: BLUE CROSS/BLUE SHIELD

## 2024-07-01 ENCOUNTER — Ambulatory Visit: Payer: BLUE CROSS/BLUE SHIELD

## 2024-07-01 ENCOUNTER — Telehealth: Payer: PRIVATE HEALTH INSURANCE

## 2024-07-01 ENCOUNTER — Inpatient Hospital Stay: Payer: BLUE CROSS/BLUE SHIELD

## 2024-07-01 DIAGNOSIS — Z950 Presence of cardiac pacemaker: Secondary | ICD-10-CM

## 2024-07-01 DIAGNOSIS — R001 Bradycardia, unspecified: Secondary | ICD-10-CM

## 2024-07-01 DIAGNOSIS — I495 Sick sinus syndrome: Secondary | ICD-10-CM

## 2024-07-01 NOTE — Telephone Encounter
 Per the pt she has been feeling anxious x4 hours with feelings of hunger and slight SOB. The pt was advised to initiate a remote transmission to assess the device function and to report to an ER if symptoms were to escalate. The pt she will transmit as soon as possible. We will continue to monitor.

## 2024-07-01 NOTE — Procedures
 SEE MEDIA SECTION FOR DOWNLOAD PRINTOUT     Ann Duran is a 48 y.o. female  [id number]  Reading MD: Dr Georgina LABOR Medtronic Carelink remote monitor was received as a routine download. Elevated RV thresholds reading ''High'' with no recorded events. No loss of capture noted. The pt will be scheduled for in office pacer eval on 07/08/24 to assess RV thresholds. [facility] scheduling have been made aware. We will continue to monitor. See Media for full report.

## 2024-07-02 ENCOUNTER — Inpatient Hospital Stay: Payer: BLUE CROSS/BLUE SHIELD

## 2024-07-02 DIAGNOSIS — I495 Sick sinus syndrome: Secondary | ICD-10-CM

## 2024-07-02 DIAGNOSIS — Z950 Presence of cardiac pacemaker: Secondary | ICD-10-CM

## 2024-07-02 NOTE — Procedures
 SEE MEDIA SECTION FOR DOWNLOAD PRINTOUT     Ann Duran is a 48 y.o. female  [id number]  Reading MD: Dr Georgina LABOR Medtronic Carelink remote monitor was received as a patient initiated download. Pt initiated transmission for symptoms of feeling anxious and slight SOB yesterday 07/01/24. The device function is unchanged with known elevated RV thresholds with no loss of capture noted. The pt will be seen in clinic on 07/08/24 for RV lead/device testing and programming and was provided ER precautions for escalating symptoms. The pt feels well now and reports not symptoms. We will continue to monitor. See Media for full report.

## 2024-07-06 NOTE — Consults
 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    Subjective:   Ann Duran is a 48 y.o. female who was seen in clinic for pacemaker evaluation. He cardiac history is remarkable foir a diagnosis of L-transposition/ single ventricle. Her first cardiac surgery was a Modified RA tPA Fontan procedure at age 73. She required a thoracic duct ligation after her Fontan and then reportedly did well until age 9 when she presented with a complaint of fatigue and was diagnosed with atrial arrhythmias. She then underwent a Fontan conversion in Jan 2009 at which time pacemaker leads were placed but the device was placed 2 weeks later after she developed bradycardia and fluid retention. The atrial lead was non functional and so she was VVI paced. She underwent attempted atrial lead revision with diaphragm plication in April of 2010, but the new atrial lead also failed to function. She was tried on VVI backup pacing but was eventually changed to VVIR at 70 bpm lower rate, which she has tolerated well since 2011, but has had increasing requirement for ventricular pacing.     Ann Duran underwent device replacement on 12-13-2023 without complications. This was done after an EP study showed diffuse atrial scarring with no viable site for atrial pacing and complete heart block.    Ann Duran has been doing well overall with no new concerns. She comes in today after remote transmission showed increase in ventricular output with adaptive capture management.     Past Medical History:  Past Medical History:   Diagnosis Date    Anxiety     controlled per pt    Cholelithiases     Noted on ultrasound, asymptomatic     Delayed emergence from general anesthesia 2010    pacemaker insertion, per patient    DORV (double outlet right ventricle)     GERD (gastroesophageal reflux disease)     well controlled w/med and diet    History of blood transfusion     HPV (human papilloma virus) infection     IUD (intrauterine device) in place     Mirena placed 12/27/10, replaced 12/2015, replaced November 16, 2016    Pacemaker     in abd    Post-operative nausea and vomiting     Sick sinus syndrome (HCC/RAF)      Social History:  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  Negative tobacco use.  Rare EtOH.    Review of Systems:  14 point review of system negative other than as stated above    Medications:  Outpatient Medications Prior to Visit   Medication Sig Dispense Refill    amoxicillin 500 mg tablet       Ascorbic Acid (VITAMIN C) 1000 MG tablet Take 1 tablet (1,000 mg total) by mouth as needed for.      aspirin  81 mg EC tablet Take 1 tablet (81 mg total) by mouth daily. 90 tablet 3    Cholecalciferol (VITAMIN D) 125 MCG (5000 UT) CAPS Take by mouth.      citalopram 20 mg tablet Take 1 tablet (20 mg total) by mouth.      clonazePAM 0.5 mg tablet TAKE 1 TABLET(0.5 MG) BY MOUTH AT NIGHT AS NEEDED  FOR INSOMNIA      cyanocobalamin 1000 mcg tablet Take 1 tablet (1,000 mcg total) by mouth as needed for.      diclofenac Sodium (VOLTAREN) 1% gel Apply 2 g topically.      fluticasone 50 mcg/act nasal spray   0    furosemide  80 mg tablet Please take Furosemide  80mg  in the morning and 40mg  in the afternoon (Patient taking differently: Take 0.25 tablets (20 mg total) by mouth three (3) times daily. Please take Furosemide  20mg  in the morning and 20mg  in the afternoon and 20mg  in the evening) 120 tablet 3    ketoconazole 2% cream       levonorgestrel (MIRENA, 52 MG,) 20 mcg/day IUD by Intrauterine route.      lisinopril  2.5 mg tablet Take 1 tablet (2.5 mg total) by mouth daily. 90 tablet 3    PANTOPRAZOLE  20 mg DR tablet TAKE 1 TABLET BY MOUTH DAILY AS  NEEDED 90 tablet 3    potassium chloride  10 MEQ tablet Take 1 tablet (10 mEq total) by mouth daily. (Patient taking differently: Take 1 tablet (10 mEq total) by mouth as needed for. Every 3 days) 90 tablet 3    sildenafil  20 mg tablet Take 1 tablet (20 mg total) by mouth three (3) times daily. 270 tablet 3    spironolactone  50 mg tablet Take 3 tablets (150 mg total) by mouth daily. 270 tablet 3    valacyclovir 500 mg tablet ValACYclovir HCl - 500 MG Oral Tablet      zolpidem 5 mg tablet Take 0.5 tablets (2.5 mg total) by mouth at bedtime as needed for Sleep.      clonazePAM 0.5 mg tablet Take 1 tablet (0.5 mg total) by mouth. (Patient not taking: Reported on 07/08/2024)       No facility-administered medications prior to visit.     Allergies:  Allergies   Allergen Reactions    Other          Objective:     Vitals:  BP 94/54 (BP Location: Left arm, Patient Position: Sitting, Cuff Size: Regular)  ~ Pulse 71  ~ Temp 36.5 ?C (97.7 ?F) (Forehead)  ~ Resp 18  ~ Ht 1.651 m (5' 5'')  ~ Wt 62.6 kg (138 lb 1.6 oz)  ~ SpO2 93%  ~ BMI 22.98 kg/m?  Facility age limit for growth %iles is 20 years. Facility age limit for growth %iles is 20 years. Facility age limit for growth %iles is 20 years.    General:   alert and no distress   Skin:   warm and dry, no hyperpigmentation, vitiligo, or suspicious lesions   Nodes:   cervical, supraclavicular, and axillary nodes normal.   Eyes:   No conjunctival injection, sclerae anicteric   Oropharynx:  lips, mucosa, and tongue normal; teeth and gums normal   Neck:  no adenopathy, no carotid bruit, no JVD, supple, symmetrical, trachea midline and thyroid not enlarged, symmetric, no tenderness/mass/nodules   Lungs:   clear to auscultation bilaterally   Heart:   regular rate and rhythm, S1: single and S2: normal   Abdomen:   soft, non-tender; bowel sounds normal; no masses,  no organomegaly   Extremities:  extremities normal, atraumatic, no cyanosis or edema   Psychiatric:   normal mood, behavior, speech, dress, and thought processes ECG last visit shows V pacing, and when pacing turned down, junctional rhythm at 57 bpm. No atrial activity present.    Assessment:   48 y.o. female with  DORV status post Fontan conversion to extracardiac baffle on 11/21/2007, with subsequent placement of an epicardial pacemaker for sinus node dysfunction.  She had atrial lead failure shortly after initial placement.  In 2010 she underwent an unsuccessful attempt at placement of a new atrial lead.     She returns today for routine follow up. Capture management disabled and set to fixed output of 3V at 1.2 ms.    Plan/ Recommendation:     Remote monitoring every 3 months.  Follow up in clinic in one year    Venetia SQUIBB. Georgina, MD

## 2024-07-08 ENCOUNTER — Ambulatory Visit: Payer: BLUE CROSS/BLUE SHIELD

## 2024-07-08 ENCOUNTER — Inpatient Hospital Stay: Payer: PRIVATE HEALTH INSURANCE

## 2024-07-08 DIAGNOSIS — R001 Bradycardia, unspecified: Secondary | ICD-10-CM

## 2024-07-08 DIAGNOSIS — I4430 Unspecified atrioventricular block: Principal | ICD-10-CM

## 2024-07-08 DIAGNOSIS — I495 Sick sinus syndrome: Secondary | ICD-10-CM

## 2024-07-08 DIAGNOSIS — Z950 Presence of cardiac pacemaker: Secondary | ICD-10-CM

## 2024-07-09 ENCOUNTER — Ambulatory Visit: Payer: PRIVATE HEALTH INSURANCE

## 2024-07-09 DIAGNOSIS — Z8774 Personal history of (corrected) congenital malformations of heart and circulatory system: Secondary | ICD-10-CM

## 2024-07-09 DIAGNOSIS — I2721 Secondary pulmonary arterial hypertension: Secondary | ICD-10-CM

## 2024-07-09 DIAGNOSIS — Q204 Double inlet ventricle: Secondary | ICD-10-CM

## 2024-07-09 DIAGNOSIS — Q201 Double outlet right ventricle: Secondary | ICD-10-CM

## 2024-07-09 MED ORDER — SILDENAFIL CITRATE 20 MG PO TABS
20 mg | ORAL_TABLET | Freq: Three times a day (TID) | ORAL | 3 refills | 30.00000 days | Status: AC
Start: 2024-07-09 — End: ?

## 2024-07-09 NOTE — Procedures
 SEE MEDIA SECTION FOR PROGRAMMER PRINTOUT   CAR EP Device Clinic Pacemaker     PATIENT: Ann Duran  MRN: [id number]  DOB: 11/14/1975     DATE OF PROCEDURE: 07/09/2024  TIME OF PROCEDURE: 1500     INDICATIONS: Pacemaker evaluation   PROCEDURE: Pacemaker evaluation     Location: [facility] [employer]     Technician: Jeralyn Row, NP     READING PHYSICIAN:  Dr. Georgina     Device:  Device Manufacturer: Medtronic  Device Model Name: Nelwyn HEWS DR MRI  Model Number: [id number]  Device Serial Number: [id number]  Device Implant Date: 12/13/2023  Indication for Implant:      RA lead: (Non Capturing / OFF)  Manufacturer: Medtronic   Model Number: [id number]  Serial Number: [id number]  Date of Implant: 12/05/2007     RV lead: [facility])  Manufacturer: Medtronic   Model Number: [id number]  Serial Number: [id number]  Date of Implant: 12/05/2007     Battery: 6.6 years     Underlying Rhythm: V rate in 50's   R wave: 14.1 mV     Impedance:  A: 399 ohms  V: 570 ohms     Capture threshold  V: 2.0-2.25 V @ 1.0 ms      Since 05/06/2024     Tachy episode summary:  VT/VF: 0      VP: 97%     Programmed changes:   Turned auto capture off to improve battery longevity      Settings:   Mode: VVIR  Rate: Lower/Upper Sensor: 70/150 bpm  RV Output: 3.25 V @ 1.0 ms  RV Sensitivity: 2.0 mV     Assessment  1. 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 S/p Fontan conversion to extracardiac baffle on 11/21/07.  2. Underwent surgical placement of abdominal pacemaker generator on 12/05/07, but atrial lead was non-capturing, now with single site ventricular pacing. Generator change 12/12/2017 by Dr. Belynda. Generator change 12/13/2023 by Dr. Arlyne. Medtronic single chamber pacer with normal function. Chronically high but stable RV thresholds.   3. Patient is not pacemaker dependent.   4. No ventricular high rate episodes.     Plan:  1. Patient seen today by Dr. Georgina, see separate EP note.  2. Programming changes: Turned auto capture off to improve battery longevity   3. Remote monitoring every 3 months.  4. Return for device evaluation and consultation with Dr. Georgina in 1 year, sooner for any symptoms.        Maricella Filyaw, NP

## 2024-07-15 ENCOUNTER — Telehealth: Payer: BLUE CROSS/BLUE SHIELD

## 2024-07-15 DIAGNOSIS — Q204 Double inlet ventricle: Secondary | ICD-10-CM

## 2024-07-15 DIAGNOSIS — Q201 Double outlet right ventricle: Secondary | ICD-10-CM

## 2024-07-15 DIAGNOSIS — Z950 Presence of cardiac pacemaker: Secondary | ICD-10-CM

## 2024-07-15 DIAGNOSIS — Z8774 Personal history of (corrected) congenital malformations of heart and circulatory system: Secondary | ICD-10-CM

## 2024-07-15 MED ORDER — FUROSEMIDE 20 MG PO TABS
20 mg | ORAL_TABLET | Freq: Three times a day (TID) | ORAL | 3 refills | 30.00000 days | Status: AC
Start: 2024-07-15 — End: ?

## 2024-07-15 NOTE — Telephone Encounter
 New Rx Request      Last seen by MD: 05/06/2024    Reason for the request: [facility] pharmacy called to advise patient informed them now that she is only taking 60 mg per day.         Any Symptoms:  []  Yes  [x]  No      If yes, what symptoms are you experiencing:    Duration of symptoms (how long):    Have you taken medication for symptoms (OTC or Rx):      Is a particular medication being requested? Furosemide  20 mg    Was an appointment offered? No    Patient or caller has been notified of the turnaround time of 1-2 business day(s).

## 2024-07-15 NOTE — Telephone Encounter
 Called and spoke with the patient, confirmed she is currently taking 20mg  TID. She reports weights 135 lb, which is stable since the summer. She has no swelling or bloating. She will notify our office if her weights increase 2lb, 2 days in a row or 5 lb in a week or symptoms of fluid overload. Will send in prescription as she is taking it.

## 2024-07-21 ENCOUNTER — Telehealth: Payer: PRIVATE HEALTH INSURANCE

## 2024-07-21 ENCOUNTER — Ambulatory Visit: Payer: BLUE CROSS/BLUE SHIELD

## 2024-07-21 DIAGNOSIS — Z950 Presence of cardiac pacemaker: Principal | ICD-10-CM

## 2024-07-21 DIAGNOSIS — R001 Bradycardia, unspecified: Secondary | ICD-10-CM

## 2024-07-21 NOTE — Telephone Encounter
 Patient called with C/O feeling fatigue and tired after last pacer programming. Remote monitor reviewed with normal findings. Mailed a 14 day CAM monitor to eval symptoms. All questions answered.

## 2024-07-22 ENCOUNTER — Inpatient Hospital Stay: Payer: PRIVATE HEALTH INSURANCE

## 2024-07-22 ENCOUNTER — Inpatient Hospital Stay: Payer: BLUE CROSS/BLUE SHIELD

## 2024-07-22 DIAGNOSIS — Z950 Presence of cardiac pacemaker: Secondary | ICD-10-CM

## 2024-07-22 DIAGNOSIS — R001 Bradycardia, unspecified: Secondary | ICD-10-CM

## 2024-07-22 DIAGNOSIS — I495 Sick sinus syndrome: Secondary | ICD-10-CM

## 2024-07-22 NOTE — Progress Notes
 Ann Duran is a 48 y.o. female  [id number]  Reading MD: Dr Georgina  A 14 day [employer] Cardiac Holter Monitor was Mailed to the Patient on  07/22/24 at 0125.  Instruction provided to Patient about application, monitoring duration, care of the monitor, return instruction mail back to company. All questions answered and patient can call for any question.

## 2024-07-22 NOTE — Procedures
 SEE MEDIA SECTION FOR DOWNLOAD PRINTOUT     Ann Duran is a 48 y.o. female  [id number]  Reading MD: Dr Georgina LABOR Medtronic Carelink remote monitor was received as a patient initiated download. Pt initiated transmission. Patient called with C/O feeling fatigue and tired after last pacer programming. Remote monitor reviewed with normal findings. Mailed a 14 day CAM monitor to eval symptoms. Device function is normal with no new events. We will continue to monitor. See Media for full report.

## 2024-08-04 ENCOUNTER — Other Ambulatory Visit: Payer: BLUE CROSS/BLUE SHIELD

## 2024-08-05 ENCOUNTER — Inpatient Hospital Stay: Payer: PRIVATE HEALTH INSURANCE

## 2024-08-05 ENCOUNTER — Ambulatory Visit: Payer: BLUE CROSS/BLUE SHIELD | Attending: Cardiovascular Disease

## 2024-08-05 ENCOUNTER — Other Ambulatory Visit: Payer: PRIVATE HEALTH INSURANCE

## 2024-08-05 DIAGNOSIS — I495 Sick sinus syndrome: Secondary | ICD-10-CM

## 2024-08-05 DIAGNOSIS — Z950 Presence of cardiac pacemaker: Secondary | ICD-10-CM

## 2024-08-05 NOTE — Progress Notes
 [facility]     Date of Visit: 08/05/2024   Reason for Visit: Follow up s/p cardiac catheterization on 08/08/17, in setting of DORV s/p Fontan conversion to extracardiac baffle on 11/21/07, and subsequent placement of epicardial permanent pacemaker.     HPI: Ann Duran is a 48 y.o. 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  Underwent modified Fontan procedure (patch connection of the IVC/SVC via the right atrium to MPA) at age 48 yrs at [facility].  Postoperative chylothorax in1982, subsequently requiring exploration by a left thoracotomy, with ligation of lymphatic vessel and thoracic duct.  Suffered fatigue and exercise intolerance in November 2008, and was found to be in atrial fibrillation.  Underwent cardioversion at [facility] by Dr. Franky Kirsch on 10/14/07, along with cardiac cath which revealed excellent RA/Fontan pressures with a mean of 12-46mmHg.  Underwent Fontan conversion on 11/21/07, involving takedown of previous Fontan and placement of an extracardiac Fontan and bidirectional Marcey shunt and placement of epicardial permanent pacing leads. Discharged on 11/29/07  Re-admitted on 12/03/07 for thoracentesis of large right pleural effusion, in the setting of bradycardia and junctional rhythm  Underwent surgical placement of abdominal pacemaker generator on 12/05/07, but atrial lead was non-capturing, left with single site ventricular pacing.  Discharged on 12/12/07, after aggressive diuresis in the setting of significant ascites and perineal edema.  Subsequent diuresis over the next 6 weeks with resolution of ascites on moderate dose lasix  and moderate dose aldactone .  Underwent left hemidiaphragm plication by Dr. Belynda on 03/02/2009, unsuccessful attempt at placement of an atrial lead.  PPM rate set at VVI 90 bpm during the hospitalization, reduced to 60 bpm by Dr. Kirsch on 03/29/2009.  In the setting of poor chronotropic response to exercise, rate response activated by Dr. Kirsch in July 2011 with significantly improved exercise ability.  Underwent placement of Mirena IUD and D & C by Dr. Montel Parvatenini in February 2012. No cardiovascular complications.  Mirena IUD replaced with LEEP procedure in January 2018  Good functional capacity with MVO2 of 24.8 (88% predicted) in August 2016.  Low normal single RV function, improves with exercise.  No exercise induced arrhythmias  V-paced 99% of time at rate of 70, generator nearing ERI as of May 2018 remote check, requiring monthly checks to detect ERI.   Liver US  in Sept 2017 showed no lesions but METAVIR score was 4 consistent with cirrhosis.  Underwent cardiac cath on 08/08/17 by Dr. Orvilla, showing mean Fontan pressures of , and liver biopsy showing extensive bridging fibrosis, but no cirrhosis.  Spirinolactone dose was doubled from 25mg  daily to 50mg  daily in an attempt to reduce Fontan volume pressure.  Underwent pacemaker generator replacement ([employer]) on 12/12/17 by Dr. Belynda Salmons to [place]  for work in early 2020, had issues with the humidity, insomnia and had some exertional decline.   06/01/2021  cardiac catheterization & Liver Biopsy  by Dr. 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   Seen urgently in [facility] clinic in 7/24 with increased abdominal distention due to increased salt intake, diuretics increased but had evidence of AKI therefore diuretic dosing was adjusted multiple times over the next month.  EPS study performed by Dr. Kirsch on 12/09/22 showing ''Isolated atrial signal  in localized anterior site (inferior near AVV annulus)- otherwise, no atrial capture with high-output pacing.'' Generator replaced on 12/13/23 by Dr. Arlyne.      Interval History:      Seen by a GI specialist in [facility] in 7/25- ordered stool testing which per patient showed poor protein absorption and excessive gas production, and also recommended an EGD and colonoscopy- she is going to schedule at [facility] to have a nasal septoplasty done which per patient is not done under anesthesia    Underwent PPM reprogramming in 9/25 to increase battery longevity but that resulted in her noticing more dyspnea with ambulation, she has been in contact with EP and they have sent her a CAM monitor.    Up to date with dental and takes abx.    Attempting to find an [facility] provider at [facility] [place] .    Past Medical History: As above.  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 Mickiel here at [facility].  LEEP in Jan 2018.  Mirena IUD replaced in Jan 2018.  Nose bleeds s/p bipolar cauterization 10/2016    Allergies:   Allergies   Allergen Reactions    Other       Medications that the patient states to be currently taking   Medication Sig    amoxicillin 500 mg tablet     Ascorbic Acid (VITAMIN C) 1000 MG tablet Take 1 tablet (1,000 mg total) by mouth as needed for.    aspirin  81 mg EC tablet Take 1 tablet (81 mg total) by mouth daily.    Cholecalciferol (VITAMIN D) 125 MCG (5000 UT) CAPS Take by mouth.    citalopram 20 mg tablet Take 1 tablet (20 mg total) by mouth.    furosemide  20 mg tablet Take 1 tablet (20 mg total) by mouth three (3) times daily.    levonorgestrel (MIRENA, 52 MG,) 20 mcg/day IUD by Intrauterine route.    lisinopril  2.5 mg tablet Take 1 tablet (2.5 mg total) by mouth daily.    PANTOPRAZOLE  20 mg DR tablet TAKE 1 TABLET BY MOUTH DAILY AS  NEEDED    potassium chloride  10 MEQ tablet Take 1 tablet (10 mEq total) by mouth daily.    sildenafil  20 mg tablet Take 1 tablet (20 mg total) by mouth three (3) times daily.    spironolactone  50 mg tablet Take 3 tablets (150 mg total) by mouth daily.        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  Nonsmoker, occas ETOH.    Family History: Mother and father in their 15s in good health. Siblings, she has1 brother who is in good health. There is no history of congenital heart disease in the family.    ROS: Negative and noncontributory except as noted above in HPI and Interval Events.     Physical Exam:  VS: There were no vitals taken for this visit.   General: Pleasant woman, in NAD.    At last in person visit-  Skin: No rashes. Sternotomy and left thoracotomy scars well healed.  Head and Neck: Pupils are equal and reacting.   Mouth: Normal oral mucosa. Excellent dentition.  Neck: Jugular venous pressure difficult to estimate.  Chest:  Lungs clear bilaterally   Heart: Single S1 with no systolic murmurs or rubs. No diastolic murmurs or gallops.  Abdomen: Mildly distended, no fluid wave or ascites. Soft,  non-tender.  Tympanitic.  Extremities: No edema on legs.   Neurologic: Alert and oriented x 4.  Psychologic: Normal mood and affect.    Cardiac Diagnostic Data:    Abd CTA 05/16/23:  1.  Status post Fontan procedure with evidence of hepatic congestion/chronic liver disease and sequela of portal hypertension including splenomegaly, portosystemic collaterals, and small volume ascites. No suspicious liver lesion.  2.  Cholelithiasis.    TTE 05/06/24:  1. L-transposition of the great arteries.   2. 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   3. 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 Low normal systemic RV systolic function.   4. Large secundum ASD. Status post extracardiac Fontan. Visualized portions of the Fontan appear patent, no obvious fenestration noted. Marcey shunt appears normal.   5. A prior echo performed on 05/26/1973 was reviewed for comparison. No significant changes noted since the previous study.    Liver US  05/06/24:  1.  Coarsened hepatic echotexture with lobulated surface contour suggestive of Fontan associated liver disease.   2.  Several hyperechoic foci are seen, including a new subcapsular focus of the right liver and two recently seen right and left hepatic foci on OSH US . Consider MRI/CT liver mass protocol for further characterization.  3.  Cholelithiasis without evidence of cholecystitis.  4.  Splenomegaly suggesting portal hypertension.      Liver US  at OSH 12/31/23:  Hepatic cirrhosis with marked splenomegaly. A few subcentimeter hyperechoic hepatic lesions are present. Consider repeat surveillance ultrasound in 3-6 months.     Echo 05/27/23:  1. Double out right ventricle with hypoplastic LV and large VSD with ''  functional single ventricle'' Right ventricular hypertrophy. Low normal  systemic RV systolic function.  2. 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  3. Large secundum ASD. Status post extracardiac Fontan. Visualized portions of  the Fontan appear patent, no obvious fenestration noted. Marcey shunt appears  normal.  4. L-transposition of the great arteries.  5. A prior echo performed on 05/25/2022 was reviewed for comparison. No  significant changes noted since the previous study.      Abdominal Ultrasound 05/25/2022  CLINICAL HISTORY: s/p Fontan, please assess for nodules, lesions and absesses.   COMPARISON: Abdominal ultrasound dated 05/23/2018.   TECHNIQUE: Real time grayscale and color Doppler images of the abdominal organs were obtained using a curved transducer.   FINDINGS:   Pancreas: Partially visualized and unremarkable.   Liver: Coarse in echotexture with irregular surface contour.   Focal liver lesions: None.   Portal vein: Normal, hepatopetal flow.   Gallbladder: Normally distended, containing gallstones. No gallbladder wall thickening. No sonographic Murphy's sign.   Bile ducts: Normal in caliber.   Kidneys: Normal in size with normal cortical thickness and echogenicity. No hydronephrosis.   Spleen: Splenomegaly.   Ascites: None in the upper abdomen.   Visualized proximal aorta and IVC: Unremarkable.   MEASUREMENTS: Liver: 14.6 cm Common Duct: 4.8 mm Right Kidney: 11.5 cm Left Kidney: 12.1 cm Spleen: 17.8 cm Aorta: 1.6 cm      IMPRESSION:    1. Coarsening of hepatic echotexture with irregularity in surface contour suggesting fibrosis/cirrhosis. No suspicious liver lesions.    2. Cholelithiasis without evidence of cholecystitis.    3. Splenomegaly suggesting portal hypertension.     Echocardiogram 05/25/2022  PHYSICIAN INTERPRETATION: CONOTRUNCAL ANATOMY: The cardiac structural malformations are consistent with a diagnosis of levo-transposition of the great arteries. LEFT VENTRICLE: Patient demonstrated  normal sinus rhythm during echocardiogram. 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 RIGHT ATRIUM: Large secundum ASD. Status post extracardiac Fontan. Visualized portions of the Fontan appear patent, no obvious fenestration noted. Marcey shunt appears widely patent with low velocity flow noted. MITRAL VALVE: Trace mitral valve regurgitation. TRICUSPID VALVE: Trace tricuspid valve regurgitation. AORTIC VALVE: No evidence of aortic valve regurgitation. AORTA: No coarctation of the aorta. PERICARDIUM: There is no evidence of pericardial effusion. CONCLUSIONS  1. 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  2. 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 Low normal systemic RV systolic function.  3. Large secundum ASD. Status post extracardiac Fontan. Visualized portions of the Fontan appear patent, no obvious fenestration noted. Marcey shunt appears normal.  4. L-transposition of the great arteries.  5. A prior echo performed on 08/09/2020 was reviewed for comparison. No significant changes noted since the previous study.    06/01/2021 Cardiac Catheterization [facility]- Dr. Oneil Giovanni                 Liver Biopsy 06/01/2021 [facility]- Dr. Oneil Giovanni       Echocardiogram 08/09/20- reviewed by me:  1. 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   2. Double out right ventricle with hypoplastic LV and large VSD with'' functional single ventricle'' Right ventricular hypertrophy. Low normal systemic RV systolic function.   3. Large secundum ASD. Status post extracardiac Fontan. Visualized portions of the Fontan appear patent, no obvious fenestration noted. Marcey shunt appears normal.   4. L-transposition of the great arteries.   5. Large secundum ASD. Status post extracardiac Fontan. Visualized portions of the Fontan appear patent, no obvious fenestration noted. Marcey shunt appears widely patent with low velocity flow noted.   6. A prior echo performed on 07/29/2019 was reviewed for comparison. No significant changes noted since the previous study.    Pacemaker remote transmission 05/07/20:  4.9 years longevity, VVIR mode, device function normal, one episode of possible NSVT (3 beats) on 6/12 after walking 5K, asymptomatic, 99% V pacing.    Echocardiogram 07/29/2019:  1. L-transposition of the great arteries.  2. 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 Low normal systemic RV systolic function.  3. Double outlet right ventricle-doubly commited ventricular septal defect-aorta anterior to pulmonary artery-no left ventricular outflow tract obstruction.  4. 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  5. Large secundum ASD. Status post extracardiac Fontan. Visualized portions of the Fontan appear patent, no obvious fenestration noted. Marcey shunt appears normal.  6. A prior echo performed on 05/13/2018 was reviewed for comparison. No significant changes noted since the previous study.    CAM  Monitor 04/12/17:  Predominant rhythm: Paced   Paced rhythm Ventricular Ectopy = (<1%)  1802 isolated, unifocal PVCs and 208 pairs   Atrial Ectopy = (<1%)  1 isolated PAC  Predominantly V paced rhythm with rare supraventricular beats  (most are labeled PVC's). Symptoms of ''Chest discomfort'  correlated with 2 narrow complex beats/. Symptoms of  Dizziness/Lightheadedness correlated with ventricular pacing.  One of four button presses correlated with a narrow complex  beat, the remaining button presses correlated with ventricular  pacing.    Echocardiogram:  05/13/18  PHYSICIAN INTERPRETATION:  CONOTRUNCAL ANATOMY: The cardiac structural malformations are consistent with a diagnosis of levo-transposition of the great arteries.  LEFT VENTRICLE: Patient demonstrated normal  sinus rhythm during echocardiogram. 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  RIGHT VENTRICLE: Double out right ventricle with hypoplastic LV and large VSD with '' functional single ventricle'' Right ventricular hypertrophy. Low normal systemic RV systolic function.  LEFT ATRIUM:  RIGHT ATRIUM: Right atrial pressure is estimated at 8 mmHg. Right atrial pressure is estimated at 8 mmHg. Large secundum ASD. Status post extracardiac Fontan. Visualized portions of the Fontan appear patent, no obvious fenestration noted. Marcey shunt   appears normal.  MITRAL VALVE: No evidence of mitral valve regurgitation.  TRICUSPID VALVE: Trace tricuspid valve regurgitation.  AORTIC VALVE: The aortic valve was not well visualized. Aortic valve area was not quantified by continuity equation on this study. Aorta is anterior of the pulmonic valve.  No evidence of aortic regurgitation.  PULMONIC VALVE: No indication of pulmonic valve regurgitation. Pulmonic is posterior of the aorta.  AORTA: Normal aortic arch, normal descending aorta, normal mid ascending aorta and the aortic root is normal in size and structure. No coarctation of the aorta.  PULMONARY ARTERY: The pulmonary artery is not well seen.  SYSTEMIC VEINS: The inferior vena cava is normal in size and exhibits less than 50% respiratory change.  PERICARDIUM: There is no evidence of pericardial effusion.  CONCLUSIONS:   1. L-transposition of the great arteries.   2. 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 Low normal systemic RV systolic function.   3. Double outlet right ventricle-doubly commited ventricular septal defect-aorta anterior to pulmonary artery-no left ventricular outflow tract obstruction.   4. 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   5. Large secundum ASD. Status post extracardiac Fontan. Visualized portions of the Fontan appear patent, no obvious fenestration noted. Marcey shunt appears normal.   6. A prior echo performed on 05/07/2017 was reviewed for comparison. No significant changes noted since the previous study.    Stress Echo/CPX:  05/13/18  BASELINE ECG:   Ventricular paced with biphasic T wave in lead V2  PROTOCOL: Bruce. (Treadmill advanced to stage 3 on its own during stage 2 of exercise after 1')  CONTROL:   HR:  72    BP: 110/64     O2 Sat:  98% (forehead) 92% (finger)   START:  1.7 MPH at 10% grade.   STOP: 11.4 METS with 3.4 mph at 14% grade x 2?37 after 7?37'' total exercise time due to shortness of breath.  Peak HR: 129      Peak BP: 128/66         Peak O2 Sat:  94%   Peak Double-Product: 16,512    Maximum VO2 = 1391 ml which is 77% of predicted, maximum VO2/kg = 23.7 ml/kg which is 85% of predicted (Note: The reference values are adjusted for weight and modality of exercise)  VE/VO2 =  40   VE/VCO2 = 36      VE/VCO2 slope = 30.1  VO2/HR =11.1 which is 111% predicted  Breathing Reserve = 41  RQ= 1.12  Anaerobic Threshold was reached at HR = 103 VO2 =  1144 ml     VO2/kg =  19.5 ml/kg  RESULTS:   Symptoms:  Shortness of breath and leg fatigue, denied chest pain  ST-T Changes: Uninterruptable in the presence of ventricular pacing.  Dysrhythmias: Rare PVCs during exercise  BP Response:  Blunted.  IMPRESSIONS: Fair exercise tolerance achieving 71% maximum predicted heart rate, limited by shortness of breath. ST changes during exercise are  uninterpretable in the presence of ventricular pacing. No chest pain. Rare PVCs during exercise. Blunted BP at a low double product. Maximum VO2/kg = 23.7 ml/kg which is 85% of predicted. VE/VCO2 slope = 30.1 which is 119% of predicted. Compared with previous stress/CPX of 06/21/15 exercise tolerance has decreased from 12.7 METS, MVO2/kg had been 24.8, VE/VCO2 slope had been 23.1.  BASELINE:  Baseline: Single ventricle with low normal systolic function.  ADDITIONAL BASELINE FINDINGS:  LEFT VENTRICLE: Global left ventricular systolic function is mildly decreased (LVEF 40-49%).  MITRAL VALVE: Trace mitral valve regurgitation.  TRICUSPID VALVE: Trace tricuspid valve regurgitation.     Global left ventricular function increased appropriately with stress. No new segmental wall motion abnormalities were seen. Global left ventricular systolic function at peak stress is lower limits of normal (LVEF 50-55%). Mild mitral valve regurgitation.   Mild tricuspid regurgitation. Post-exercise: mild increase in single ventricle contractility.     SUMMARY:   1. Please see separately resulted cardiopulmonary exercise test for details of exercise performance.   2. DORV, L-TGA, hypoplastic LV, large VSD, large ASD.   3. Baseline: Single ventricle with low normal systolic function.   4. Post-exercise: mild increase in single ventricle contractility    Liver US :  05/13/18  IMPRESSION:  1. Irregular liver contour and coarsened in echotexture representing underlying chronic liver disease/fibrosis. No focal mass. Cholelithiasis. Splenomegaly supports portal hypertension. Normal liver Doppler.  2. Shear wave liver stiffness measurement indicating moderate to severe fibrosis (MetaVir stages F2-F3). Note, the morphologic appearance of the liver appears more fibrotic than Elastography measurements indicate.    Cardiac Cath:  08/08/17:      Liver US   07/01/17 (outside report) shows no lesions, Metavir score of 4.    Echocardiogram:  05/07/17 PHYSICIAN INTERPRETATION:  CONOTRUNCAL ANATOMY: The cardiac structural malformations are consistent with a diagnosis of levo-transposition of the great arteries.  LEFT VENTRICLE: Visually estimated left ventricular ejection fraction 55-60%. Patient demonstrated normal sinus rhythm during echocardiogram. 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  RIGHT VENTRICLE: Double out right ventricle with hypoplastic LV and large VSD with '' functional single ventricle'' Right ventricular hypertrophy. Low normal systemic RV systolic function.  RIGHT ATRIUM: Right atrial pressure is estimated at 8 mmHg. Right atrial pressure is estimated at 8 mmHg. Large secundum ASD. Status post extracardiac Fontan. Visualized portions of the Fontan appear patent, no obvious fenestration noted. Marcey shunt   appears normal.  MITRAL VALVE: No evidence of mitral valve regurgitation.  TRICUSPID VALVE: Mild tricuspid valve regurgitation.  AORTIC VALVE: The aortic valve was not well visualized. Aorta is anterior of the pulmonic valve.  No evidence of aortic regurgitation.  PULMONIC VALVE: No indication of pulmonic valve regurgitation. Pulmonic is posterior of the aorta.  AORTA: Normal aortic arch and normal descending aorta. The ascending aorta was not well visualized. Mildly enlarged aortic root (4.2 cm). No coarctation of the aorta.  PULMONARY ARTERY: The pulmonary artery is not well seen.  SYSTEMIC VEINS: The inferior vena cava is normal in size and exhibits less than 50% respiratory change.  PERICARDIUM: There is no evidence of pericardial effusion.  CONCLUSIONS:   1. L-transposition of the great arteries.   2. Double outlet right ventricle-doubly commited ventricular septal defect-aorta anterior to pulmonary artery-no left ventricular outflow tract obstruction.   3. 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 Low normal systemic RV systolic function.   4. Large secundum ASD. Status post extracardiac Fontan.  Visualized portions of the Fontan appear patent, no obvious fenestration noted. Marcey shunt appears normal.   5. Left ventricular ejection fraction is approximately 55-60%.   6. Mildly enlarged aortic root (4.2 cm).   7. Mild tricuspid valve regurgitation.   8. A prior echo performed on 03/09/2016 was reviewed for comparison. No significant changes noted since the previous study.   9. 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     ECG 11/07/16: Ventricular paced. Ventricular rate of 75 bpn, QRS duration 184 ms. QT/QTc 498/556 ms    Liver Ultrasound 07/27/16 per report from OSH: Liver 17.5 cm in length. It is normal in echodensity. Liver contour is smooth. No focal lesion os identified. Hepatopedal flow is identifies in the main, right, and left portal veins. Gallbladder and biliary tree: There are several small mobile gallstones. There is no focal tenderness. The gallbladder wall is thickened measuring 0.7 cm. There is no pericholecystic fluid. No intra or extrahepatic biliary ductal dilatation.      Echocardiogram 03/09/16: CONOTRUNCAL ANATOMY: The cardiac structural malformations are consistent with a diagnosis of levo-transposition of the great arteries. 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 The aorta is anterior to the pulmonary artery. There is no sub-aortic obstruction of left ventricular outflow tract. LEFT VENTRICLE: Normal left ventricular size. Visually estimated left ventricular ejection fraction 55-60%. Small left ventricular size. RIGHT VENTRICLE: (DTI 6.0 cm/s). 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 Low normal systemic RV systolic function. LEFT ATRIUM: Mild left atrial enlargement. RIGHT ATRIUM: Right atrial pressure is estimated at 8 mmHg. Large secundum ASD. Status post extracardiac Fontan. Visualized portions of the Fontan appear patent, no obvious fenestration noted. Marcey shunt appears normal. MITRAL VALVE: No evidence of mitral valve regurgitation. TRICUSPID VALVE: Mild tricuspid valve regurgitation. AORTIC VALVE: The aortic valve is trileaflet and structurally normal, with normal leaflet excursion. Aorta is anterior of the pulmonic valve. No evidence of aortic regurgitation. PULMONIC VALVE: No indication of pulmonic valve regurgitation. Pulmonic is posterior of the aorta. AORTA: Normal mid ascending aorta and normal aortic arch. Mildly enlarged aortic root (3.9 cm). SYSTEMIC VEINS: The inferior vena cava is normal in size and exhibits less than 50% respiratory change. PERICARDIUM: There is no evidence of pericardial effusion.    CONCLUSIONS:  1. 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 Low normal systemic RV systolic function.  2. Left ventricular ejection fraction is approximately 55-60%.  3. Double outlet right ventricle.  4. Double outlet right ventricle     -doubly commited ventricular septal defect     -aorta anterior to pulmonary artery     -no left ventricular outflow tract obstruction.  5. L-transposition of the great arteries.  6. Mild left atrial enlargement.  7. Large secundum ASD. Status post extracardiac Fontan. Visualized portions of the Fontan appear patent, no obvious fenestration noted. Marcey shunt appears normal.  8. Mild tricuspid valve regurgitation.  9. Mildly enlarged aortic root (3.9 cm).    Abdominal US  06/21/15:  The pancreas is partially visualized and grossly unremarkable. The liver is mildly heterogeneous in echogenicity. There is no focal liver lesion. Portal vein demonstrates hepatopetal flow. No intra- or extrahepatic biliary dilation. The common bile duct is normal in caliber. The gallbladder is normally distended, containing gallstones. No gallbladder wall thickening or pericholecystic fluid. No  sonographic Murphy's sign. The spleen is mildly enlarged. The kidneys are normal in size. Both kidneys demonstrate normal cortical thickness and echogenicity. No hydronephrosis. There is no ascites. The visualized proximal aorta and IVC are unremarkable. MEASUREMENTS: Liver: 15.1 cm Common Duct: 3 mm Right Kidney: 10.5 cm Left Kidney: 11.5 cm Spleen: 17 cm Aorta: 1.1 cm SHEAR WAVE LIVER STIFFNESS MEASUREMENTS: Mean 1.63 +/-0.12 m/sec, Median 1.66 m/sec, equating to 5.7-12 kPA. REFERENCE (m/s, kPa): Normal: 0.81-1.22 m/s 2.0-4.5 kPa (METAVIR F0) Normal/mild: 1.22-1.37 m/s 4.5-5.7 kPa (METAVIR F0-F1) Mild/moderate: 1.37-2 m/s 5.7-12.0 kPa (METAVIR F2-F3) Moderate/severe: 2-2.64 m/s 12.0-21.0 kPa (METAVIR F3-F4) Severe: >2.64 m/s >21.0 kPa (METAVIR F4) IMPRESSION: 1. Heterogenous liver echogenicity, suggesting diffuse liver disease. No suspicious liver lesions. Patent hepatic vasculature. 2. Shear wave liver stiffness measurement indicating mild/moderate liver fibrosis, MetaVir score of F2-3. 3. Splenomegaly. 4. Cholelithiasis without evidence of acute cholecystitis.    Echocardiogram 06/21/15:  2D AND M-MODE MEASUREMENTS (normal ranges within parentheses): Aorta/Left Atrium: Aortic Root, d (2D): 2.8 cm LV DIASTOLIC FUNCTION: MV Peak E: 9.36 m/s E/e' Ratio: 40.2 LV IVRT: 134 msec Decel Time: 229 msec Right Ventricle: TAPSE: 1.0 cm Aortic Valve: AoV Max Vel: 1.05 m/s AoV Peak PG: 4 mmHg AoV Mean PG: Tricuspid Valve and PA/RV Systolic Pressure: TR Max Velocity: 3.2 m/s RA Pressure: 8 mmHg RVSP/PASP: 49 mmHg Pulmonic Valve: PV Max Velocity: 0.8 m/s PV Max PG: 2 mmHg PV Mean PG: Aorta: Ao Asc: 2.4 cm. PHYSICIAN INTERPRETATION: CONOTRUNCAL ANATOMY: The cardiac structural malformations are consistent with a diagnosis of levo-transposition of the great arteries. 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 The aorta is anterior to the pulmonary artery. There is no sub-aortic obstruction of left ventricular outflow tract. LEFT VENTRICLE: Visually estimated left ventricular ejection fraction 55-60%. Small left ventricular size. RIGHT VENTRICLE: TAPSE 1.0 cm, (DTI 6.0 cm/s). 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 Low normal systemic RV systolic function. RIGHT ATRIUM: Right atrial pressure is estimated at 8 mmHg. Large secundum ASD. Status post extracardiac Fontan. Visualized portions of the Fontan appear patent, no obvious fenestration noted. Unable to obtain images of Glenn shunt. MITRAL VALVE: No evidence of mitral valve regurgitation. TRICUSPID VALVE: Mild tricuspid valve regurgitation. Tricuspid regurgitation velocity is not well seen. AORTIC VALVE: The aortic valve is trileaflet and structurally normal, with normal leaflet excursion. Aorta is anterior of the pulmonic valve. No evidence of aortic regurgitation. PULMONIC VALVE: No indication of pulmonic valve regurgitation. Pulmonic is posterior of the aorta. AORTA: The aortic root is normal in size and structure, normal mid ascending aorta and normal aortic arch. SYSTEMIC VEINS: The inferior vena cava is not well visualized. PERICARDIUM: There is no evidence of pericardial effusion. CONCLUSIONS: 1. Small left ventricular size 2. 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 3. L-transposition of the great arteries. 4. Mild tricuspid valve regurgitation. 5. 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 Low normal systemic RV systolic function.     CPEX/Echo 06/11/15:  BASELINE: Low normal systemic RV systolic function at rest. Hypoplastic LV. ADDITIONAL BASELINE FINDINGS: MITRAL VALVE: Trace mitral valve regurgitation. TRICUSPID VALVE: Trace tricuspid valve regurgitation. No new segmental wall motion abnormalities were seen. Global left ventricular systolic function at peak stress is normal (LVEF 60-65%).  Improved systemic RV systolic function with stress. No increase in degree of atrioventricular valve regurgitation with exercise. SUMMARY: 1. Improved systemic RV systolic function with stress. No increase in degree of atrioventricular valve regurgitation with exercise. 2. Low normal systemic RV systolic function at rest. Hypoplastic LV. BASELINE ECG: Ventricular paced rhythm PROTOCOL: Bruce. CONTROL: HR: 74 BP: 120/74 O2 Sat: 95 % START: 1.7 MPH at 10% grade. STOP: 12.7 METS with 4.2 mph at 16 % grade x 1?33'' after 10?33'' total exercise time due to shortness of breath. Peak HR: 141 Peak BP: 136/56 Peak O2 Sat: 95 % Peak Double-Product: 19,176 Maximum VO2 = 1386 ml which is 77 % of predicted, maximum VO2/kg = 24.8 ml/kg which is 88 % of predicted (Note: The reference values are adjusted for weight and modality of exercise) VE/VO2 = 35 VE/VCO2 = 31 VE/VCO2 slope = 23.1 VO2/HR = 9.8 which is 99 % predicted Breathing Reserve = 50 RQ= 1.13 Anaerobic Threshold was reached at HR = 103 VO2 = 1094 ml VO2/kg = 19.6 ml/kg RESULTS: Symptoms: Shortness of breath and leg fatigue; no chest pain or discomfort. ST-T Changes: Uninterpretable due to paced rhythm. Dysrhythmias: Rare PVC during exercise BP Response: Appropriate. IMPRESSIONS: Good exercise tolerance achieving 77 % maximum predicted heart rate, limited by shortness of breath. Exercise induced ST changes are uninterpretable due to paced rhythm. No exercise induced chest pain at a low double product. Rare PVCs as described above. Maximum VO2 = 1386 ml which is 77 % of predicted, maximum VO2/kg = 24.8 ml/kg which is 88 % of predicted . VE/VCO2 slope = 23.1 which is 92 % of predicted. Compared with previous stress/CPX of 04/24/13 maximum VO2/kg had been 25.0 at 11.9 METS    Echocardiogram:  04/21/13: CONCLUSIONS:  1. Double outlet right ventricle.  2. Moderately enlarged right ventricular size and low normal systolic function. 3. Moderately increased RV wall thickness. 4. Mild tricuspid regurgitation. 5. Status post extracardiac Fontan, appears widely patent, no fenestration noted. Marcey shunt not well visualized.    Labs:      05/14/24:  WBC 4.7k, Hgb 13, Hct 40%, plts 98k, INR 1.1, Na 129, K 4.3, BUN 29, creat 0.91, BNP 53, LFTs normal, AFP 3.8, [place] 19-9 is 9, GGT 99, prealbumin 29  12/31/23:  WBC 3.4, Hgb 12.4, Hct 38%, plts 112k, AFP 3.4, HgbA1C 5.6%, creat 1.02, BUN 25, alb 5.3, INR 1.1, GGT 88, LFTs normal,   07/04/23:  Na 131, K 5.2, creat 1.2, BUN 24, [place] 9.5, BNP 48  06/24/23:   Na 130, K 5.2, creat 1.25, BUN 30, Mg 2.3,   06/17/23:  Na 130, K 5.3, creat 1.08  05/25/23:  Na 135, K 4.9, creat 1.22, INR 1.2, LFTs acceptable, AFP 2.5, [place] 19-9 is 10  05/31/22:  WBC 3.3k, Hgb 13.2, plts 107k, INR 1.1, K 4.4, creat 0.86, TB 1, TP 8.2, AFP 3.6, [place] 19-9 14, prealbumin 27  05/02/2022 WBC 2.9 Hgb 13.1 Plts 90 Na 135  K 4.3 BUN 19 Cr 0.84 Alb 4.9 AST 24 ALT 19 ALP 74      Component 05/02/22 08/25/20 08/17/20 08/18/18   Auto WBC 2.9 Low  3.0 Low  2.8 Low  5.80   RBC 4.46 4.98 4.93 5.11   Hemoglobin 13.1 14.6 14.1 15.0   Hematocrit 38.7 43.5 43.0 44.5   MCV 87 87 87 87.1   MCH 29.4 29.3 28.6 29.4   MCHC 33.9 33.6 32.8 33.7   RDW 12.3 12.1 12.1 13.8  Platelets 90 Low Panic 90 101 Low   90 Low Panic   89 Low    Hematology Comments: Note:  Note:  Note:  --     08/18/18: WBC 5.8 RBC 5.11 Hgb 15 Hct 44.5 Plt 89 NA 139 K 5.3 BUN 16 Creat 0.81 Gluc 71 BNP 149 TSH 2.39  08/08/17: Creat 0.72, BUN 12, K 3.8  07/01/17:  Chol 121, trig 58, HDL 40, LDL 69, Hgb A1c 5.4, INR 1.2, prealbumin 24, TSH 3.6, Hct 43.2, Plat 82k  07/26/16: WBC 3.0, HGB 4.69, HCT 40.7, PLT 75, NA 137, K 3.8, BUN 13, CREAT 0.7, ALK PHOS 57, AST 30, ALT 38, TBil 1.3, GGT 100, PREALB 24, BNP 113, INR 1.2, TSH 4.24, FT3 3.6, FT4 1.05.  04/01/13:  Hgb 14.7, Hct 42.4, MCV 85.1, plat ct 106k, Na 143, K 4.2, creat 0.8, BUN 19, AST 36, ALT 32    Impressions:  DORV (although recent echos appear to show a DILV with single LVEF 50%, and earlier surgical notes describe her anatomy as DILV), L-malposed great arteries: s/p RA-PA anastomosis Fontan at age 8 years, now s/p Extracardiac Fontan and bidirectional Marcey shunt on 11/21/07 by Dr. Jannell Pitter, including bi-atrial Maze procedure and placement of a permanent atrial pacing lead.  Postoperative junctional rhythm and bradycardia: re-admitted 4 days after discharge with large right pericardial effusion requiring thoracentesis and placement of an abdominal pacemaker generator and epicardial ventricular lead on 12/05/07 (non-functioning atrial lead).  Ascites, improved on aldactone . No evidence of ascites on annual abdominal US .  Longstanding history of irregular and heavy menses, thus far unsuccessfully regulated on several types of hormonal therapy. Better controlled on Mirena since 2012.  Also has cervical HPV.  Now with CIN III, underwent LEEP procedure in Jan 2018  History of atrial fibrillation, but no post Fontan revision tachyarrhythmias thus far, formerly on coumadin (possible side effect of hair loss), now on ASA.  Longstanding elevated left hemidiapragm, s/p successful plication on 03/02/2009 by Dr. Pitter.  Liver US  in 2014, 2016 and 2017, and 2018 shows no lesions, but 2017 and 2018 outside liver US  report Metavir score of 4, which is consistent with cirrhosis.  2019 liver US  at [facility] showed MetaVir stage F2-F3, although elastography impacted by congestive hepatopathy.   Cardiac cath and liver biopsy on 08/08/17, with mean Fontan pressure under sedation of , and liver biopsy showing extensive bridging fibrosis but no cirrhosis.  Spirinolactone was increased from 25 to 50mg  to 100mg  after this cath  Ventricular pacemaker, pacing 99% of time.   Generator replaced on 12/12/17 by Dr. Pitter. Deferred placement of transvenous atrial lead via her LPA to minimize V pacing, based on Saraih's wish not to be committed to anticoagulation  Stable exercise capacity in July 2019, with MVO2 23.7 (85% predicted), previously 24.8 and 88% predicted in 2016  PM generator change by Dr Arlyne 12/10/23    [facility]    Labs (BMP, BNP, LFTs, GGT, AFP, INR, CBC, Prealbumin):  [x]  Within last year (date: 05/25/2023)  [x]  Ordered today:   []  Deferred:      Hep C screening  [x]  Prior HCV Ab negative (date: 06/21/15)  []  Not indicated (no surgery prior to early 1990s)  []  Ordered    Last hemodynamic cath:   [x]  Within last 10 years (date: 06/01/2021), see above results   []  Deferred. Reason:   []  Scheduled    Last Liver biopsy  [x]  Within last 10 years (date: 06/01/2021), see  above results   []  Scheduled with cath    Liver Imaging:  []  MRI Abdomen within last 5 years (date: ), see above  []  Triple phase CT within last 5 years (date: ), see above  [x]  Liver ultrasound completed (date: 05/06/24), see above  [x]  Ordered today, see above.     Advanced cardiac imaging:  []  Cardiac MRI (date: ), see above  [x]  Cardiac CT (date: 12/12/2007), see above  [x]  Deferred. Reason: Pacemaker  []  Ordered today, see above     PDE-5 Inhibitor recommended:  [x]  On sildenafil  []  On tadalafil  []  Not tolerated. Previously on []  sildenafil  (Year: )  []  tadalafil (Year: )   Side effect:  []  Deferred. Reason:   []  Ordered today, see above    Echo  [x]  Within last year (date: 05/06/24)   []  Deferred. Reason:   []  Ordered today    Cardiopulmonary exercise test  [x]  Within last 3 years (date: 05/13/18), see above results  []  Deferred. Reason:   []  Ordered today    Advanced care planning  [x]  Date: Advanced directive in CC from 12/27/2010  []  Deferred. Reason:       Recommendations:   Continue current diuretic regimen which is working for her.  She does not feel bloated or volume overloaded.  EGD and colonoscopy ideally should be performed at [facility] or another [facility] given need for deep sedation  Low sodium diet recommended.  Liver US  in early 2026, updated AFP and CA 19-9 at that time.  Continued hepatology FU with Dr Henriette.   Continue daily ASA  In person visit at [facility] in early 2026.  EP FU re CAM results and PPM reprogramming.

## 2024-08-06 NOTE — Procedures
 SEE MEDIA SECTION FOR DOWNLOAD PRINTOUT     Ann Duran is a 48 y.o. female  [id number]  Reading MD: Dr Georgina LABOR Medtronic Carelink remote monitor was received as a routine download. The device function is normal with no recorded events. Continue remote monitoring Q1 month x1 then Q3 months. See Media for full report.

## 2024-08-12 ENCOUNTER — Telehealth: Payer: PRIVATE HEALTH INSURANCE

## 2024-08-12 NOTE — Telephone Encounter
 Spoke with patient to follow up on CAM monitor, per patient they have completed it and mailed it back this morning.

## 2024-08-20 NOTE — Procedures
 VVI pacing with underlying AF throughout.

## 2024-08-24 ENCOUNTER — Telehealth: Payer: PRIVATE HEALTH INSURANCE

## 2024-08-24 ENCOUNTER — Other Ambulatory Visit: Payer: PRIVATE HEALTH INSURANCE

## 2024-08-24 ENCOUNTER — Other Ambulatory Visit: Payer: BLUE CROSS/BLUE SHIELD

## 2024-08-24 DIAGNOSIS — I495 Sick sinus syndrome: Secondary | ICD-10-CM

## 2024-08-24 DIAGNOSIS — I4819 Other persistent atrial fibrillation: Secondary | ICD-10-CM

## 2024-08-24 MED ORDER — APIXABAN 5 MG PO TABS
5 mg | ORAL_TABLET | Freq: Two times a day (BID) | ORAL | 11 refills | 45.00000 days | Status: AC
Start: 2024-08-24 — End: ?

## 2024-08-24 NOTE — Telephone Encounter
 Spoke with patient about holter results of being in persistent afib. She feels she has been in this for about 1 month due to increase fatigue and SOB. Sent patient for labs, started her on Eliquis and will plan D/C cardioversion ASAP. She has been having a lot of GI problems and is pending an endoscopy/colonoscope that she knows will need to be at least 1 month post cardioversion. All questions answered for now.

## 2024-08-24 NOTE — Telephone Encounter
 Appointment Accommodation Request      Appointment Type:New    Reason for sooner request:Veronica calling from [facility] cardiology to schedule urgent appt       Date/Time Requested (If any): ANY     Last seen by MD: NEW    Any Symptoms:  []  Yes  [x]  No      If yes, what symptoms are you experiencing:   Duration of symptoms (how long):     Patient or caller was offered an appointment but declined.    Patient or caller was advised to seek emergency services if conditions are urgent or emergent.    Patient or caller has been notified of the turnaround time of 1-2 business (days).

## 2024-08-24 NOTE — Telephone Encounter
Spoke with patient. See phone note.

## 2024-08-25 ENCOUNTER — Telehealth: Payer: PRIVATE HEALTH INSURANCE

## 2024-08-25 ENCOUNTER — Other Ambulatory Visit: Payer: BLUE CROSS/BLUE SHIELD

## 2024-08-25 NOTE — Telephone Encounter
Messaged pt via mychart.

## 2024-08-25 NOTE — Telephone Encounter
 Outbound (return) call, spoke to pt and scheduled with Dr. Boyd in December.    MESSAGE COMPLETE  Message Complete, No further Action Required.   Ok to [employer].

## 2024-08-25 NOTE — Telephone Encounter
 Phn ms/Jeremy P. Georgina, MD [phone]   [phone] / call back request/ Patient  is requesting a call back from Robynn Thank you

## 2024-08-27 ENCOUNTER — Other Ambulatory Visit: Payer: PRIVATE HEALTH INSURANCE

## 2024-08-29 ENCOUNTER — Other Ambulatory Visit: Payer: BLUE CROSS/BLUE SHIELD

## 2024-08-31 NOTE — Telephone Encounter
 Spoke with patient about labs and that Creatine can cause fluid retention and might need to be avoided.

## 2024-09-01 ENCOUNTER — Ambulatory Visit: Payer: PRIVATE HEALTH INSURANCE

## 2024-09-01 ENCOUNTER — Other Ambulatory Visit: Payer: PRIVATE HEALTH INSURANCE

## 2024-09-01 ENCOUNTER — Ambulatory Visit: Payer: BLUE CROSS/BLUE SHIELD

## 2024-09-01 ENCOUNTER — Inpatient Hospital Stay
Admission: RE | Admit: 2024-09-01 | Discharge: 2024-09-01 | Disposition: A | Discharge status: Home / Self Care | Payer: PRIVATE HEALTH INSURANCE | Source: Home / Self Care | Attending: Cardiovascular Disease

## 2024-09-01 DIAGNOSIS — R001 Bradycardia, unspecified: Secondary | ICD-10-CM

## 2024-09-01 DIAGNOSIS — I495 Sick sinus syndrome: Secondary | ICD-10-CM

## 2024-09-01 DIAGNOSIS — Z950 Presence of cardiac pacemaker: Secondary | ICD-10-CM

## 2024-09-01 DIAGNOSIS — Z8774 Personal history of (corrected) congenital malformations of heart and circulatory system: Secondary | ICD-10-CM

## 2024-09-01 DIAGNOSIS — Q204 Double inlet ventricle: Secondary | ICD-10-CM

## 2024-09-01 DIAGNOSIS — I27849 S/P Fontan procedure: Secondary | ICD-10-CM

## 2024-09-01 LAB — Pregnancy Test,Urine: PREGNANCY TEST,URINE: NEGATIVE

## 2024-09-01 NOTE — Procedures
 SEE MEDIA SECTION FOR PROGRAMMER PRINTOUT   [facility] Pacemaker     PATIENT: Ann Duran  MRN: [id number]  DOB: 11/14/1975     DATE OF PROCEDURE: 09/01/2024  TIME OF PROCEDURE: 1315     INDICATIONS: Pre cardioversion pacemaker evaluation   PROCEDURE: Pacemaker evaluation     Location: [facility] [place] [facility] 5     Technician: Rocky Laster, RN     READING PHYSICIAN:  Dr. Georgina     Device:  Device Manufacturer: Medtronic  Device Model Name: Nelwyn HEWS DR MRI  Model Number: [id number]  Device Serial Number: [id number]  Device Implant Date: 12/13/2023  Indication for Implant:      RA lead: (Non Capturing / OFF)  Manufacturer: Medtronic   Model Number: [id number]  Serial Number: [id number]  Date of Implant: 12/05/2007     RV lead: [facility])  Manufacturer: Medtronic   Model Number: [id number]  Serial Number: [id number]  Date of Implant: 12/05/2007     Battery: 7 years     Underlying Rhythm: V rate in 50's   R wave: 19.3 mV (per device)     Impedance:  A: 399 ohms  V: 589 ohms     Capture threshold not tested today- results from 07/08/2024  V: 2.0-2.25 V @ 1.0 ms      Since 07/08/2024     Tachy episode summary:  VT/VF: 0      VP: 97%     Programmed changes:   None     Settings:   Mode: VVIR  Rate: Lower/Upper Sensor: 70/150 bpm  RV Output: 3.25 V @ 1.0 ms  RV Sensitivity: 2.0 mV     Assessment  1. 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 S/p Fontan conversion to extracardiac baffle on 11/21/07.  2. Underwent surgical placement of abdominal pacemaker generator on 12/05/07, but atrial lead was non-capturing, now with single site ventricular pacing. Generator change 12/12/2017 by Dr. Belynda. Generator change 12/13/2023 by Dr. Arlyne. Medtronic single chamber pacer with normal function. Chronically high but stable RV thresholds.   3. Patient is not pacemaker dependent.   4. No ventricular high rate episodes.     Plan:  1. Dr. Georgina at bedside, no programming changes made.  2. Return for device evaluation and follow up with Dr. Georgina on 09/01/2024 for sensor rate optimization.    Rocky Laster, RN

## 2024-09-01 NOTE — H&P
 [facility]     Date of Visit: 09/01/2024   Reason for Visit: Follow up s/p cardiac catheterization on 08/08/17, in setting of DORV s/p Fontan conversion to extracardiac baffle on 11/21/07, and subsequent placement of epicardial permanent pacemaker.     HPI: Ann Duran is a 48 y.o. 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  Underwent modified Fontan procedure (patch connection of the IVC/SVC via the right atrium to MPA) at age 10 yrs at the [facility].  Postoperative chylothorax in1982, subsequently requiring exploration by a left thoracotomy, with ligation of lymphatic vessel and thoracic duct.  Suffered fatigue and exercise intolerance in November 2008, and was found to be in atrial fibrillation.  Underwent cardioversion at [facility] by Dr. Franky Kirsch on 10/14/07, along with cardiac cath which revealed excellent RA/Fontan pressures with a mean of 12-73mmHg.  Underwent Fontan conversion on 11/21/07, involving takedown of previous Fontan and placement of an extracardiac Fontan and bidirectional Marcey shunt and placement of epicardial permanent pacing leads. Discharged on 11/29/07  Re-admitted on 12/03/07 for thoracentesis of large right pleural effusion, in the setting of bradycardia and junctional rhythm  Underwent surgical placement of abdominal pacemaker generator on 12/05/07, but atrial lead was non-capturing, left with single site ventricular pacing.  Discharged on 12/12/07, after aggressive diuresis in the setting of significant ascites and perineal edema.  Subsequent diuresis over the next 6 weeks with resolution of ascites on moderate dose lasix  and moderate dose aldactone .  Underwent left hemidiaphragm plication by Dr. Belynda on 03/02/2009, unsuccessful attempt at placement of an atrial lead.  PPM rate set at VVI 90 bpm during the hospitalization, reduced to 60 bpm by Dr. Kirsch on 03/29/2009.  In the setting of poor chronotropic response to exercise, rate response activated by Dr. Kirsch in July 2011 with significantly improved exercise ability.  Underwent placement of Mirena IUD and D & C by Dr. Montel Parvatenini in February 2012. No cardiovascular complications.  Mirena IUD replaced with LEEP procedure in January 2018  Good functional capacity with MVO2 of 24.8 (88% predicted) in August 2016.  Low normal single RV function, improves with exercise.  No exercise induced arrhythmias  V-paced 99% of time at rate of 70, generator nearing ERI as of May 2018 remote check, requiring monthly checks to detect ERI.   Liver US  in Sept 2017 showed no lesions but METAVIR score was 4 consistent with cirrhosis.  Underwent cardiac cath on 08/08/17 by Dr. Orvilla, showing mean Fontan pressures of , and liver biopsy showing extensive bridging fibrosis, but no cirrhosis.  Spirinolactone dose was doubled from 25mg  daily to 50mg  daily in an attempt to reduce Fontan volume pressure.  Underwent pacemaker generator replacement ([employer]) on 12/12/17 by Dr. Belynda Salmons to [place]  for work in early 2020, had issues with the humidity, insomnia and had some exertional decline.   06/01/2021  cardiac catheterization & Liver Biopsy  by Dr. 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   Seen urgently in [facility] clinic in 7/24 with increased abdominal distention due to increased salt intake, diuretics increased but had evidence of AKI therefore diuretic dosing was adjusted multiple times over the next month.  EPS study performed by Dr. Kirsch on 12/09/22 showing ''Isolated atrial signal  in localized anterior site (inferior near AVV annulus)- otherwise, no atrial capture with high-output pacing.'' Generator replaced on 12/13/23 by Dr. Arlyne.      Interval History:    09/01/24: Noted to be in persistent atrial fibrillation on CAM monitor from 9/17 - 9/19. She reports most likely in for about 1 month with feelings of SOB and fatigue. Eliquis started 08/24/24. She took her Eliquis this morning. Consent for TEE/DCCV was obtained, she would like to proceed.         Past Medical History: As above.  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 Mickiel here at [facility].  LEEP in Jan 2018.  Mirena IUD replaced in Jan 2018.  Nose bleeds s/p bipolar cauterization 10/2016    Allergies:   Allergies   Allergen Reactions    Other       Medications that the patient states to be currently taking   Medication Sig    amoxicillin 500 mg tablet     apixaban (ELIQUIS) 5 mg tablet Take 1 tablet (5 mg total) by mouth two (2) times daily.    Ascorbic Acid (VITAMIN C) 1000 MG tablet Take 1 tablet (1,000 mg total) by mouth as needed for.    aspirin  81 mg EC tablet Take 1 tablet (81 mg total) by mouth daily.    Cholecalciferol (VITAMIN D) 125 MCG (5000 UT) CAPS Take by mouth.    citalopram 20 mg tablet Take 1 tablet (20 mg total) by mouth.    clonazePAM 0.5 mg tablet TAKE 1 TABLET(0.5 MG) BY MOUTH AT NIGHT AS NEEDED FOR INSOMNIA    diclofenac Sodium (VOLTAREN) 1% gel Apply 2 g topically.    fluticasone 50 mcg/act nasal spray     furosemide  20 mg tablet Take 1 tablet (20 mg total) by mouth three (3) times daily.    levonorgestrel (MIRENA, 52 MG,) 20 mcg/day IUD by Intrauterine route.    lisinopril  2.5 mg tablet Take 1 tablet (2.5 mg total) by mouth daily.    PANTOPRAZOLE  20 mg DR tablet TAKE 1 TABLET BY MOUTH DAILY AS  NEEDED    sildenafil  20 mg tablet Take 1 tablet (20 mg total) by mouth three (3) times daily.    spironolactone  50 mg tablet Take 3 tablets (150 mg total) by mouth daily. 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  Nonsmoker, occas ETOH.    Family History: Mother and father in their 55s in good health. Siblings, she has1 brother who is in good health. There is no history of congenital heart disease in the family.    ROS: Negative and noncontributory except as noted above in HPI and Interval Events.     Physical Exam:  VS: BP 99/57  ~ Pulse 74  ~ Temp 36.3 ?C (97.3 ?F) (Temporal)  ~ Resp 18  ~ Ht 5' 5'' (1.651 m)  ~ Wt 132 lb 4.4 oz (60 kg)  ~ SpO2 95%  ~ BMI 22.01 kg/m?    General: Pleasant woman, in NAD.  Skin: No rashes. Sternotomy and left thoracotomy scars well healed.  Head and Neck: Pupils are equal and reacting.   Mouth: Normal oral mucosa. Excellent dentition.  Neck: Jugular venous pressure difficult to estimate.  Chest:  Lungs clear bilaterally   Heart: Single S1  with no systolic murmurs or rubs. No diastolic murmurs or gallops.  Abdomen: Mildly distended, no fluid wave or ascites. Soft, non-tender.  Tympanitic.  Extremities: No edema on legs.   Neurologic: Alert and oriented x 4.  Psychologic: Normal mood and affect.    Cardiac Diagnostic Data:    Abd CTA 05/16/23:  1.  Status post Fontan procedure with evidence of hepatic congestion/chronic liver disease and sequela of portal hypertension including splenomegaly, portosystemic collaterals, and small volume ascites. No suspicious liver lesion.  2.  Cholelithiasis.    TTE 05/06/24:  1. L-transposition of the great arteries.   2. 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   3. 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 Low normal systemic RV systolic function.   4. Large secundum ASD. Status post extracardiac Fontan. Visualized portions of the Fontan appear patent, no obvious fenestration noted. Marcey shunt appears normal.   5. A prior echo performed on 05/26/1973 was reviewed for comparison. No significant changes noted since the previous study.    Liver US  05/06/24:  1.  Coarsened hepatic echotexture with lobulated surface contour suggestive of Fontan associated liver disease.   2.  Several hyperechoic foci are seen, including a new subcapsular focus of the right liver and two recently seen right and left hepatic foci on OSH US . Consider MRI/CT liver mass protocol for further characterization.  3.  Cholelithiasis without evidence of cholecystitis.  4.  Splenomegaly suggesting portal hypertension.      Liver US  at OSH 12/31/23:  Hepatic cirrhosis with marked splenomegaly. A few subcentimeter hyperechoic hepatic lesions are present. Consider repeat surveillance ultrasound in 3-6 months.     Echo 05/27/23:  1. Double out right ventricle with hypoplastic LV and large VSD with ''  functional single ventricle'' Right ventricular hypertrophy. Low normal  systemic RV systolic function.  2. 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  3. Large secundum ASD. Status post extracardiac Fontan. Visualized portions of  the Fontan appear patent, no obvious fenestration noted. Marcey shunt appears  normal.  4. L-transposition of the great arteries.  5. A prior echo performed on 05/25/2022 was reviewed for comparison. No  significant changes noted since the previous study.      Abdominal Ultrasound 05/25/2022  CLINICAL HISTORY: s/p Fontan, please assess for nodules, lesions and absesses.   COMPARISON: Abdominal ultrasound dated 05/23/2018.   TECHNIQUE: Real time grayscale and color Doppler images of the abdominal organs were obtained using a curved transducer.   FINDINGS:   Pancreas: Partially visualized and unremarkable.   Liver: Coarse in echotexture with irregular surface contour.   Focal liver lesions: None.   Portal vein: Normal, hepatopetal flow.   Gallbladder: Normally distended, containing gallstones. No gallbladder wall thickening. No sonographic Murphy's sign.   Bile ducts: Normal in caliber.   Kidneys: Normal in size with normal cortical thickness and echogenicity. No hydronephrosis.   Spleen: Splenomegaly.   Ascites: None in the upper abdomen.   Visualized proximal aorta and IVC: Unremarkable.   MEASUREMENTS: Liver: 14.6 cm Common Duct: 4.8 mm Right Kidney: 11.5 cm Left Kidney: 12.1 cm Spleen: 17.8 cm Aorta: 1.6 cm      IMPRESSION:    1. Coarsening of hepatic echotexture with irregularity in surface contour suggesting fibrosis/cirrhosis. No suspicious liver lesions.    2. Cholelithiasis without evidence of cholecystitis.    3. Splenomegaly suggesting portal hypertension.     Echocardiogram 05/25/2022  PHYSICIAN INTERPRETATION:  CONOTRUNCAL ANATOMY: The cardiac structural malformations are consistent with a diagnosis of levo-transposition of the great arteries. LEFT VENTRICLE: Patient demonstrated normal sinus rhythm during echocardiogram. 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 RIGHT ATRIUM: Large secundum ASD. Status post extracardiac Fontan. Visualized portions of the Fontan appear patent, no obvious fenestration noted. Marcey shunt appears widely patent with low velocity flow noted. MITRAL VALVE: Trace mitral valve regurgitation. TRICUSPID VALVE: Trace tricuspid valve regurgitation. AORTIC VALVE: No evidence of aortic valve regurgitation. AORTA: No coarctation of the aorta. PERICARDIUM: There is no evidence of pericardial effusion. CONCLUSIONS  1. 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  2. 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 Low normal systemic RV systolic function.  3. Large secundum ASD. Status post extracardiac Fontan. Visualized portions of the Fontan appear patent, no obvious fenestration noted. Marcey shunt appears normal.  4. L-transposition of the great arteries.  5. A prior echo performed on 08/09/2020 was reviewed for comparison. No significant changes noted since the previous study.    06/01/2021 Cardiac Catheterization [facility]- Dr. Oneil Giovanni                 Liver Biopsy 06/01/2021 [facility]- Dr. Oneil Giovanni       Echocardiogram 08/09/20- reviewed by me:  1. 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   2. Double out right ventricle with hypoplastic LV and large VSD with'' functional single ventricle'' Right ventricular hypertrophy. Low normal systemic RV systolic function.   3. Large secundum ASD. Status post extracardiac Fontan. Visualized portions of the Fontan appear patent, no obvious fenestration noted. Marcey shunt appears normal.   4. L-transposition of the great arteries.   5. Large secundum ASD. Status post extracardiac Fontan. Visualized portions of the Fontan appear patent, no obvious fenestration noted. Marcey shunt appears widely patent with low velocity flow noted.   6. A prior echo performed on 07/29/2019 was reviewed for comparison. No significant changes noted since the previous study.    Pacemaker remote transmission 05/07/20:  4.9 years longevity, VVIR mode, device function normal, one episode of possible NSVT (3 beats) on 6/12 after walking 5K, asymptomatic, 99% V pacing.    Echocardiogram 07/29/2019:  1. L-transposition of the great arteries.  2. 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 Low normal systemic RV systolic function.  3. Double outlet right ventricle-doubly commited ventricular septal defect-aorta anterior to pulmonary artery-no left ventricular outflow tract obstruction.  4. 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  5. Large secundum ASD. Status post extracardiac Fontan. Visualized portions of the Fontan appear patent, no obvious fenestration noted. Marcey shunt appears normal.  6. A prior echo performed on 05/13/2018 was reviewed for comparison. No significant changes noted since the previous study.    CAM  Monitor 04/12/17:  Predominant rhythm: Paced   Paced rhythm   Ventricular Ectopy = (<1%)  1802 isolated, unifocal PVCs and 208 pairs   Atrial Ectopy = (<1%)  1 isolated PAC  Predominantly V paced rhythm with rare supraventricular beats  (most are labeled PVC's). Symptoms of ''Chest discomfort'  correlated with 2 narrow complex beats/. Symptoms of  Dizziness/Lightheadedness correlated with ventricular pacing.  One of four button presses correlated with a narrow complex  beat, the remaining button presses correlated with ventricular  pacing.    Echocardiogram:  05/13/18  PHYSICIAN INTERPRETATION:  CONOTRUNCAL ANATOMY: The cardiac structural malformations are consistent with a diagnosis of levo-transposition of the great arteries.  LEFT VENTRICLE: Patient demonstrated normal sinus rhythm during echocardiogram. 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  RIGHT VENTRICLE: Double out right ventricle with hypoplastic LV and large VSD with '' functional single ventricle'' Right ventricular hypertrophy. Low normal systemic RV systolic function.  LEFT ATRIUM:  RIGHT ATRIUM: Right atrial pressure is estimated at 8 mmHg. Right atrial pressure is estimated at 8 mmHg. Large secundum ASD. Status post extracardiac Fontan. Visualized portions of the Fontan appear patent, no obvious fenestration noted. Marcey shunt   appears normal.  MITRAL VALVE: No evidence of mitral valve regurgitation.  TRICUSPID VALVE: Trace tricuspid valve regurgitation.  AORTIC VALVE: The aortic valve was not well visualized. Aortic valve area was not quantified by continuity equation on this study. Aorta is anterior of the pulmonic valve.  No evidence of aortic regurgitation.  PULMONIC VALVE: No indication of pulmonic valve regurgitation. Pulmonic is posterior of the aorta.  AORTA: Normal aortic arch, normal descending aorta, normal mid ascending aorta and the aortic root is normal in size and structure. No coarctation of the aorta.  PULMONARY ARTERY: The pulmonary artery is not well seen.  SYSTEMIC VEINS: The inferior vena cava is normal in size and exhibits less than 50% respiratory change.  PERICARDIUM: There is no evidence of pericardial effusion.  CONCLUSIONS:   1. L-transposition of the great arteries.   2. 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 Low normal systemic RV systolic function.   3. Double outlet right ventricle-doubly commited ventricular septal defect-aorta anterior to pulmonary artery-no left ventricular outflow tract obstruction.   4. 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   5. Large secundum ASD. Status post extracardiac Fontan. Visualized portions of the Fontan appear patent, no obvious fenestration noted. Marcey shunt appears normal.   6. A prior echo performed on 05/07/2017 was reviewed for comparison. No significant changes noted since the previous study.    Stress Echo/CPX:  05/13/18  BASELINE ECG:   Ventricular paced with biphasic T wave in lead V2  PROTOCOL: Bruce. (Treadmill advanced to stage 3 on its own during stage 2 of exercise after 1')  CONTROL:   HR:  72    BP: 110/64     O2 Sat:  98% (forehead) 92% (finger)   START:  1.7 MPH at 10% grade.   STOP: 11.4 METS with 3.4 mph at 14% grade x 2?37 after 7?37'' total exercise time due to shortness of breath.  Peak HR: 129      Peak BP: 128/66         Peak O2 Sat:  94%   Peak Double-Product: 16,512    Maximum VO2 = 1391 ml which is 77% of predicted, maximum VO2/kg = 23.7 ml/kg which is 85% of predicted (Note: The reference values are adjusted for weight and modality of exercise)  VE/VO2 =  40   VE/VCO2 = 36      VE/VCO2 slope = 30.1  VO2/HR =11.1 which is 111% predicted  Breathing Reserve = 41  RQ= 1.12  Anaerobic Threshold was reached at HR = 103 VO2 =  1144 ml     VO2/kg =  19.5 ml/kg  RESULTS:   Symptoms:  Shortness of breath and leg fatigue, denied chest pain  ST-T Changes: Uninterruptable in the presence of ventricular pacing.  Dysrhythmias:  Rare PVCs during exercise  BP  Response:  Blunted.  IMPRESSIONS: Fair exercise tolerance achieving 71% maximum predicted heart rate, limited by shortness of breath. ST changes during exercise are uninterpretable in the presence of ventricular pacing. No chest pain. Rare PVCs during exercise. Blunted BP at a low double product. Maximum VO2/kg = 23.7 ml/kg which is 85% of predicted. VE/VCO2 slope = 30.1 which is 119% of predicted. Compared with previous stress/CPX of 06/21/15 exercise tolerance has decreased from 12.7 METS, MVO2/kg had been 24.8, VE/VCO2 slope had been 23.1.  BASELINE:  Baseline: Single ventricle with low normal systolic function.  ADDITIONAL BASELINE FINDINGS:  LEFT VENTRICLE: Global left ventricular systolic function is mildly decreased (LVEF 40-49%).  MITRAL VALVE: Trace mitral valve regurgitation.  TRICUSPID VALVE: Trace tricuspid valve regurgitation.     Global left ventricular function increased appropriately with stress. No new segmental wall motion abnormalities were seen. Global left ventricular systolic function at peak stress is lower limits of normal (LVEF 50-55%). Mild mitral valve regurgitation.   Mild tricuspid regurgitation. Post-exercise: mild increase in single ventricle contractility.     SUMMARY:   1. Please see separately resulted cardiopulmonary exercise test for details of exercise performance.   2. DORV, L-TGA, hypoplastic LV, large VSD, large ASD.   3. Baseline: Single ventricle with low normal systolic function.   4. Post-exercise: mild increase in single ventricle contractility    Liver US :  05/13/18  IMPRESSION:  1. Irregular liver contour and coarsened in echotexture representing underlying chronic liver disease/fibrosis. No focal mass. Cholelithiasis. Splenomegaly supports portal hypertension. Normal liver Doppler.  2. Shear wave liver stiffness measurement indicating moderate to severe fibrosis (MetaVir stages F2-F3). Note, the morphologic appearance of the liver appears more fibrotic than Elastography measurements indicate.    Cardiac Cath:  08/08/17:      Liver US   07/01/17 (outside report) shows no lesions, Metavir score of 4.    Echocardiogram:  05/07/17 PHYSICIAN INTERPRETATION:  CONOTRUNCAL ANATOMY: The cardiac structural malformations are consistent with a diagnosis of levo-transposition of the great arteries.  LEFT VENTRICLE: Visually estimated left ventricular ejection fraction 55-60%. Patient demonstrated normal sinus rhythm during echocardiogram. 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  RIGHT VENTRICLE: Double out right ventricle with hypoplastic LV and large VSD with '' functional single ventricle'' Right ventricular hypertrophy. Low normal systemic RV systolic function.  RIGHT ATRIUM: Right atrial pressure is estimated at 8 mmHg. Right atrial pressure is estimated at 8 mmHg. Large secundum ASD. Status post extracardiac Fontan. Visualized portions of the Fontan appear patent, no obvious fenestration noted. Marcey shunt   appears normal.  MITRAL VALVE: No evidence of mitral valve regurgitation.  TRICUSPID VALVE: Mild tricuspid valve regurgitation.  AORTIC VALVE: The aortic valve was not well visualized. Aorta is anterior of the pulmonic valve.  No evidence of aortic regurgitation.  PULMONIC VALVE: No indication of pulmonic valve regurgitation. Pulmonic is posterior of the aorta.  AORTA: Normal aortic arch and normal descending aorta. The ascending aorta was not well visualized. Mildly enlarged aortic root (4.2 cm). No coarctation of the aorta.  PULMONARY ARTERY: The pulmonary artery is not well seen.  SYSTEMIC VEINS: The inferior vena cava is normal in size and exhibits less than 50% respiratory change.  PERICARDIUM: There is no evidence of pericardial effusion.  CONCLUSIONS:   1. L-transposition of the great arteries.   2. Double outlet right ventricle-doubly commited ventricular septal defect-aorta anterior to pulmonary artery-no left ventricular outflow tract obstruction.   3. Double out right ventricle with hypoplastic LV and large VSD  with '' functional single ventricle'' Right ventricular hypertrophy. Low normal systemic RV systolic function.   4. Large secundum ASD. Status post extracardiac Fontan. Visualized portions of the Fontan appear patent, no obvious fenestration noted. Marcey shunt appears normal.   5. Left ventricular ejection fraction is approximately 55-60%.   6. Mildly enlarged aortic root (4.2 cm).   7. Mild tricuspid valve regurgitation.   8. A prior echo performed on 03/09/2016 was reviewed for comparison. No significant changes noted since the previous study.   9. 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     ECG 11/07/16: Ventricular paced. Ventricular rate of 75 bpn, QRS duration 184 ms. QT/QTc 498/556 ms    Liver Ultrasound 07/27/16 per report from OSH: Liver 17.5 cm in length. It is normal in echodensity. Liver contour is smooth. No focal lesion os identified. Hepatopedal flow is identifies in the main, right, and left portal veins. Gallbladder and biliary tree: There are several small mobile gallstones. There is no focal tenderness. The gallbladder wall is thickened measuring 0.7 cm. There is no pericholecystic fluid. No intra or extrahepatic biliary ductal dilatation.      Echocardiogram 03/09/16: CONOTRUNCAL ANATOMY: The cardiac structural malformations are consistent with a diagnosis of levo-transposition of the great arteries. 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 The aorta is anterior to the pulmonary artery. There is no sub-aortic obstruction of left ventricular outflow tract. LEFT VENTRICLE: Normal left ventricular size. Visually estimated left ventricular ejection fraction 55-60%. Small left ventricular size. RIGHT VENTRICLE: (DTI 6.0 cm/s). 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 Low normal systemic RV systolic function. LEFT ATRIUM: Mild left atrial enlargement. RIGHT ATRIUM: Right atrial pressure is estimated at 8 mmHg. Large secundum ASD. Status post extracardiac Fontan. Visualized portions of the Fontan appear patent, no obvious fenestration noted. Marcey shunt appears normal. MITRAL VALVE: No evidence of mitral valve regurgitation. TRICUSPID VALVE: Mild tricuspid valve regurgitation. AORTIC VALVE: The aortic valve is trileaflet and structurally normal, with normal leaflet excursion. Aorta is anterior of the pulmonic valve. No evidence of aortic regurgitation. PULMONIC VALVE: No indication of pulmonic valve regurgitation. Pulmonic is posterior of the aorta. AORTA: Normal mid ascending aorta and normal aortic arch. Mildly enlarged aortic root (3.9 cm). SYSTEMIC VEINS: The inferior vena cava is normal in size and exhibits less than 50% respiratory change. PERICARDIUM: There is no evidence of pericardial effusion.    CONCLUSIONS:  1. 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 Low normal systemic RV systolic function.  2. Left ventricular ejection fraction is approximately 55-60%.  3. Double outlet right ventricle.  4. Double outlet right ventricle     -doubly commited ventricular septal defect     -aorta anterior to pulmonary artery     -no left ventricular outflow tract obstruction.  5. L-transposition of the great arteries.  6. Mild left atrial enlargement.  7. Large secundum ASD. Status post extracardiac Fontan. Visualized portions of the Fontan appear patent, no obvious fenestration noted. Marcey shunt appears normal.  8. Mild tricuspid valve regurgitation.  9. Mildly enlarged aortic root (3.9 cm).    Abdominal US  06/21/15:  The pancreas is partially visualized and grossly unremarkable. The liver is mildly heterogeneous in echogenicity. There is no focal liver lesion. Portal vein demonstrates hepatopetal flow. No intra- or extrahepatic biliary  dilation. The common bile duct is normal in caliber. The gallbladder is normally distended, containing gallstones. No gallbladder wall thickening or pericholecystic fluid. No sonographic Murphy's sign. The spleen is mildly enlarged. The kidneys are normal in size. Both kidneys demonstrate normal cortical thickness and echogenicity. No hydronephrosis. There is no ascites. The visualized proximal aorta and IVC are unremarkable. MEASUREMENTS: Liver: 15.1 cm Common Duct: 3 mm Right Kidney: 10.5 cm Left Kidney: 11.5 cm Spleen: 17 cm Aorta: 1.1 cm SHEAR WAVE LIVER STIFFNESS MEASUREMENTS: Mean 1.63 +/-0.12 m/sec, Median 1.66 m/sec, equating to 5.7-12 kPA. REFERENCE (m/s, kPa): Normal: 0.81-1.22 m/s 2.0-4.5 kPa (METAVIR F0) Normal/mild: 1.22-1.37 m/s 4.5-5.7 kPa (METAVIR F0-F1) Mild/moderate: 1.37-2 m/s 5.7-12.0 kPa (METAVIR F2-F3) Moderate/severe: 2-2.64 m/s 12.0-21.0 kPa (METAVIR F3-F4) Severe: >2.64 m/s >21.0 kPa (METAVIR F4) IMPRESSION: 1. Heterogenous liver echogenicity, suggesting diffuse liver disease. No suspicious liver lesions. Patent hepatic vasculature. 2. Shear wave liver stiffness measurement indicating mild/moderate liver fibrosis, MetaVir score of F2-3. 3. Splenomegaly. 4. Cholelithiasis without evidence of acute cholecystitis.    Echocardiogram 06/21/15:  2D AND M-MODE MEASUREMENTS (normal ranges within parentheses): Aorta/Left Atrium: Aortic Root, d (2D): 2.8 cm LV DIASTOLIC FUNCTION: MV Peak E: 9.36 m/s E/e' Ratio: 40.2 LV IVRT: 134 msec Decel Time: 229 msec Right Ventricle: TAPSE: 1.0 cm Aortic Valve: AoV Max Vel: 1.05 m/s AoV Peak PG: 4 mmHg AoV Mean PG: Tricuspid Valve and PA/RV Systolic Pressure: TR Max Velocity: 3.2 m/s RA Pressure: 8 mmHg RVSP/PASP: 49 mmHg Pulmonic Valve: PV Max Velocity: 0.8 m/s PV Max PG: 2 mmHg PV Mean PG: Aorta: Ao Asc: 2.4 cm. PHYSICIAN INTERPRETATION: CONOTRUNCAL ANATOMY: The cardiac structural malformations are consistent with a diagnosis of levo-transposition of the great arteries. 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 The aorta is anterior to the pulmonary artery. There is no sub-aortic obstruction of left ventricular outflow tract. LEFT VENTRICLE: Visually estimated left ventricular ejection fraction 55-60%. Small left ventricular size. RIGHT VENTRICLE: TAPSE 1.0 cm, (DTI 6.0 cm/s). 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 Low normal systemic RV systolic function. RIGHT ATRIUM: Right atrial pressure is estimated at 8 mmHg. Large secundum ASD. Status post extracardiac Fontan. Visualized portions of the Fontan appear patent, no obvious fenestration noted. Unable to obtain images of Glenn shunt. MITRAL VALVE: No evidence of mitral valve regurgitation. TRICUSPID VALVE: Mild tricuspid valve regurgitation. Tricuspid regurgitation velocity is not well seen. AORTIC VALVE: The aortic valve is trileaflet and structurally normal, with normal leaflet excursion. Aorta is anterior of the pulmonic valve. No evidence of aortic regurgitation. PULMONIC VALVE: No indication of pulmonic valve regurgitation. Pulmonic is posterior of the aorta. AORTA: The aortic root is normal in size and structure, normal mid ascending aorta and normal aortic arch. SYSTEMIC VEINS: The inferior vena cava is not well visualized. PERICARDIUM: There is no evidence of pericardial effusion. CONCLUSIONS: 1. Small left ventricular size 2. 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 3. L-transposition of the great arteries. 4. Mild tricuspid valve regurgitation. 5. 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 Low normal systemic RV systolic function.     CPEX/Echo 06/11/15:  BASELINE: Low normal systemic RV systolic function at rest. Hypoplastic LV. ADDITIONAL BASELINE FINDINGS: MITRAL VALVE: Trace mitral valve regurgitation. TRICUSPID VALVE:  Trace tricuspid valve regurgitation. No new segmental wall motion abnormalities were seen. Global left ventricular systolic function at peak stress is normal (LVEF 60-65%). Improved systemic RV systolic function with stress. No increase in degree of atrioventricular valve regurgitation with exercise. SUMMARY: 1. Improved systemic RV systolic function with stress. No increase in degree of atrioventricular valve regurgitation with exercise. 2. Low normal systemic RV systolic function at rest. Hypoplastic LV. BASELINE ECG: Ventricular paced rhythm PROTOCOL: Bruce. CONTROL: HR: 74 BP: 120/74 O2 Sat: 95 % START: 1.7 MPH at 10% grade. STOP: 12.7 METS with 4.2 mph at 16 % grade x 1?33'' after 10?33'' total exercise time due to shortness of breath. Peak HR: 141 Peak BP: 136/56 Peak O2 Sat: 95 % Peak Double-Product: 19,176 Maximum VO2 = 1386 ml which is 77 % of predicted, maximum VO2/kg = 24.8 ml/kg which is 88 % of predicted (Note: The reference values are adjusted for weight and modality of exercise) VE/VO2 = 35 VE/VCO2 = 31 VE/VCO2 slope = 23.1 VO2/HR = 9.8 which is 99 % predicted Breathing Reserve = 50 RQ= 1.13 Anaerobic Threshold was reached at HR = 103 VO2 = 1094 ml VO2/kg = 19.6 ml/kg RESULTS: Symptoms: Shortness of breath and leg fatigue; no chest pain or discomfort. ST-T Changes: Uninterpretable due to paced rhythm. Dysrhythmias: Rare PVC during exercise BP Response: Appropriate. IMPRESSIONS: Good exercise tolerance achieving 77 % maximum predicted heart rate, limited by shortness of breath. Exercise induced ST changes are uninterpretable due to paced rhythm. No exercise induced chest pain at a low double product. Rare PVCs as described above. Maximum VO2 = 1386 ml which is 77 % of predicted, maximum VO2/kg = 24.8 ml/kg which is 88 % of predicted . VE/VCO2 slope = 23.1 which is 92 % of predicted. Compared with previous stress/CPX of 04/24/13 maximum VO2/kg had been 25.0 at 11.9 METS    Echocardiogram:  04/21/13: CONCLUSIONS:  1. Double outlet right ventricle.  2. Moderately enlarged right ventricular size and low normal systolic function. 3. Moderately increased RV wall thickness. 4. Mild tricuspid regurgitation. 5. Status post extracardiac Fontan, appears widely patent, no fenestration noted. Marcey shunt not well visualized.    Labs:      05/14/24:  WBC 4.7k, Hgb 13, Hct 40%, plts 98k, INR 1.1, Na 129, K 4.3, BUN 29, creat 0.91, BNP 53, LFTs normal, AFP 3.8, [place] 19-9 is 9, GGT 99, prealbumin 29  12/31/23:  WBC 3.4, Hgb 12.4, Hct 38%, plts 112k, AFP 3.4, HgbA1C 5.6%, creat 1.02, BUN 25, alb 5.3, INR 1.1, GGT 88, LFTs normal,   07/04/23:  Na 131, K 5.2, creat 1.2, BUN 24, [place] 9.5, BNP 48  06/24/23:   Na 130, K 5.2, creat 1.25, BUN 30, Mg 2.3,   06/17/23:  Na 130, K 5.3, creat 1.08  05/25/23:  Na 135, K 4.9, creat 1.22, INR 1.2, LFTs acceptable, AFP 2.5, [place] 19-9 is 10  05/31/22:  WBC 3.3k, Hgb 13.2, plts 107k, INR 1.1, K 4.4, creat 0.86, TB 1, TP 8.2, AFP 3.6, [place] 19-9 14, prealbumin 27  05/02/2022 WBC 2.9 Hgb 13.1 Plts 90 Na 135  K 4.3 BUN 19 Cr 0.84 Alb 4.9 AST 24 ALT 19 ALP 74      Component 05/02/22 08/25/20 08/17/20 08/18/18   Auto WBC 2.9 Low  3.0 Low  2.8 Low  5.80   RBC 4.46 4.98 4.93 5.11   Hemoglobin 13.1 14.6 14.1 15.0   Hematocrit 38.7 43.5 43.0 44.5   MCV 87 87  87 87.1   MCH 29.4 29.3 28.6 29.4   MCHC 33.9 33.6 32.8 33.7   RDW 12.3 12.1 12.1 13.8   Platelets 90 Low Panic 90 101 Low   90 Low Panic   89 Low    Hematology Comments: Note:  Note:  Note:  --     08/18/18: WBC 5.8 RBC 5.11 Hgb 15 Hct 44.5 Plt 89 NA 139 K 5.3 BUN 16 Creat 0.81 Gluc 71 BNP 149 TSH 2.39  08/08/17: Creat 0.72, BUN 12, K 3.8  07/01/17:  Chol 121, trig 58, HDL 40, LDL 69, Hgb A1c 5.4, INR 1.2, prealbumin 24, TSH 3.6, Hct 43.2, Plat 82k  07/26/16: WBC 3.0, HGB 4.69, HCT 40.7, PLT 75, NA 137, K 3.8, BUN 13, CREAT 0.7, ALK PHOS 57, AST 30, ALT 38, TBil 1.3, GGT 100, PREALB 24, BNP 113, INR 1.2, TSH 4.24, FT3 3.6, FT4 1.05.  04/01/13:  Hgb 14.7, Hct 42.4, MCV 85.1, plat ct 106k, Na 143, K 4.2, creat 0.8, BUN 19, AST 36, ALT 32    Impressions:  DORV (although recent echos appear to show a DILV with single LVEF 50%, and earlier surgical notes describe her anatomy as DILV), L-malposed great arteries: s/p RA-PA anastomosis Fontan at age 42 years, now s/p Extracardiac Fontan and bidirectional Marcey shunt on 11/21/07 by Dr. Jannell Pitter, including bi-atrial Maze procedure and placement of a permanent atrial pacing lead.  Postoperative junctional rhythm and bradycardia: re-admitted 4 days after discharge with large right pericardial effusion requiring thoracentesis and placement of an abdominal pacemaker generator and epicardial ventricular lead on 12/05/07 (non-functioning atrial lead).  Ascites, improved on aldactone . No evidence of ascites on annual abdominal US .  Longstanding history of irregular and heavy menses, thus far unsuccessfully regulated on several types of hormonal therapy. Better controlled on Mirena since 2012.  Also has cervical HPV.  Now with CIN III, underwent LEEP procedure in Jan 2018  History of atrial fibrillation, but no post Fontan revision tachyarrhythmias thus far, formerly on coumadin (possible side effect of hair loss), now on ASA.  Longstanding elevated left hemidiapragm, s/p successful plication on 03/02/2009 by Dr. Pitter.  Liver US  in 2014, 2016 and 2017, and 2018 shows no lesions, but 2017 and 2018 outside liver US  report Metavir score of 4, which is consistent with cirrhosis.  2019 liver US  at [facility] showed MetaVir stage F2-F3, although elastography impacted by congestive hepatopathy.   Cardiac cath and liver biopsy on 08/08/17, with mean Fontan pressure under sedation of , and liver biopsy showing extensive bridging fibrosis but no cirrhosis.  Spirinolactone was increased from 25 to 50mg  to 100mg  after this cath  Ventricular pacemaker, pacing 99% of time.   Generator replaced on 12/12/17 by Dr. Pitter. Deferred placement of transvenous atrial lead via her LPA to minimize V pacing, based on Marilou's wish not to be committed to anticoagulation  Stable exercise capacity in July 2019, with MVO2 23.7 (85% predicted), previously 24.8 and 88% predicted in 2016  PM generator change by Dr Arlyne 12/10/23    Plan:   Will proceed with TEE with possible electrical cardioversion, as planned.   12-lead ECG in pre-op to confirm.   Patient's mother is at bedside and will provide ride home.     Patient was seen, examined, and discussed with attending physician, Dr. Fox Salminen.    Mikle Jock, MD  ACHD Fellow  [facility]  Pager [phone]    The 12 lead ECG did not demonstrate  atrial fibrillation therefore the procedure was cancelled

## 2024-09-01 NOTE — Consults
 CONGENITAL ELECTROPHYSIOLOGY CONSULTATION       PATIENT: Ann Duran  MRN: [id number]  DOB: 11-09-1975  DATE OF SERVICE: 09/01/2024    REFERRING PRACTITIONER: Aboulhosn, Jamil A., MD  PRIMARY CARE PROVIDER: Mitchel Pac, DO    REASON FOR REFERRAL: Presented to cardioversion, EP consultation for sinus rhythm vs fine atrial fibrillation vs atrial standstill on baseline ECG      Subjective:     Ann Duran is a 48 y.o. female with with the following cardiac history:  Double outlet right ventricle vs DILV on recent echo with L-malposition of the great arteries and univentricular heart.  Underwent modified Fontan procedure (patch connection of the IVC/SVC via the right atrium to MPA) at age 67 yrs at the [facility].  Postoperative chylothorax in1982, subsequently requiring exploration by a left thoracotomy, with ligation of lymphatic vessel and thoracic duct.  Suffered fatigue and exercise intolerance in November 2008, and was found to be in atrial fibrillation.  Underwent cardioversion at [facility] by Dr. Franky Kirsch on 10/14/07, along with cardiac cath which revealed excellent RA/Fontan pressures with a mean of 12-36mmHg.  Underwent Fontan conversion on 11/21/07, involving takedown of previous Fontan and placement of an extracardiac Fontan and bidirectional Marcey shunt and placement of epicardial permanent pacing leads. Discharged on 11/29/07  Re-admitted on 12/03/07 for thoracentesis of large right pleural effusion, in the setting of bradycardia and junctional rhythm  Underwent surgical placement of abdominal pacemaker generator on 12/05/07, but atrial lead was non-capturing, left with single site ventricular pacing.  Discharged on 12/12/07, after aggressive diuresis in the setting of significant ascites and perineal edema.  Subsequent diuresis over the next 6 weeks with resolution of ascites on moderate dose lasix  and moderate dose aldactone .  Underwent left hemidiaphragm plication by Dr. Belynda on 03/02/2009, unsuccessful attempt at placement of an atrial lead.  PPM rate set at VVI 90 bpm during the hospitalization, reduced to 60 bpm by Dr. Kirsch on 03/29/2009.  In the setting of poor chronotropic response to exercise, rate response activated by Dr. Kirsch in July 2011 with significantly improved exercise ability.  Underwent placement of Mirena IUD and D & C by Dr. Montel Parvatenini in February 2012. No cardiovascular complications.  Mirena IUD replaced with LEEP procedure in January 2018  Good functional capacity with MVO2 of 24.8 (88% predicted) in August 2016.  Low normal single RV function, improves with exercise.  No exercise induced arrhythmias  V-paced 99% of time at rate of 70, generator nearing ERI as of May 2018 remote check, requiring monthly checks to detect ERI.   Liver US  in Sept 2017 showed no lesions but METAVIR score was 4 consistent with cirrhosis.  Underwent cardiac cath on 08/08/17 by Dr. Orvilla, showing mean Fontan pressures of , and liver biopsy showing extensive bridging fibrosis, but no cirrhosis.  Spirinolactone dose was doubled from 25mg  daily to 50mg  daily in an attempt to reduce Fontan volume pressure.  Underwent pacemaker generator replacement ([employer]) on 12/12/17 by Dr. Belynda Salmons to [place]  for work in early 2020, had issues with the humidity, insomnia and had some exertional decline.  06/01/2021  cardiac catheterization & Liver Biopsy  by Dr. 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   Seen urgently in [facility] clinic in 7/24 with increased abdominal distention due to increased salt intake, diuretics  increased but had evidence of AKI therefore diuretic dosing was adjusted multiple times over the next month.  EPS study performed by Dr. Clotilda on 12/09/22 showing ''Isolated atrial signal in localized anterior site (inferior near AVV annulus)- otherwise, no atrial capture with high-output pacing.'' Generator replaced on 12/13/23 by Dr. Arlyne.    History of presenting complaint: She presented today for elective TEE and cardioversion. In early September she complained of worsening fatigue and exercise intolerance. A CAM monitor was ordered which was reported as atrial fibrillation and she was commenced on Eliquis and scheduled for an elective TEE and cardioversion. She had an EP study with Dr Clotilda in Feb 2024 which demonstrated ''Isolated atrial signal in localized anterior site (inferior near AVV annulus)- otherwise, no atrial capture with high-output pacing.'' Today she describes similar symptoms of fatigue but with the dominant symptom being a reduction in exercise tolerance, ''Ann Duran theory'' and climbing stairs.    Baseline ECG demonstrates a paced rhythm with a broad QRS complex, the rhythm is regular with no clear atrial activity. Her pacemaker rate was reduced to 50 bpm and her ECG developed a slow junctional rhythm at a rate of 58 bpm which was also regular. There were no discernable p waves or atrial activity.    Social History     Socioeconomic History    Marital status: Single   Tobacco Use    Smoking status: Never    Smokeless tobacco: Never    Tobacco comments:     x10 cigarettes during college    Substance and Sexual Activity    Alcohol use: Not Currently     Alcohol/week: 1.2 oz     Types: 2 Standard drinks or equivalent per week     Comment: sometimes    Drug use: No    Sexual activity: Yes     Partners: Male     Birth control/protection: IUD (hormonal)     Social Drivers of Health     Physical Activity: Medium Risk (06/22/2024)    Received from [facility]    Physical Activity     How often do you engage in moderate physical activity for 30 minutes or more?: 4 to 6 days a week     Vigorous Physical Activity: 1 to 3 days a week     Time Spent Sitting: 4 to 6 hours   Stress: Medium Risk (06/22/2024)    Received from [facility]    Stress     Stress in your Life: Moderate     Dealing with Stress: Very effective   Financial Resource Strain: Low Risk  (06/22/2024)    Received from [facility] Resource Strain     In the past 12 months has the electric, gas, oil, or water company threatened to shut off services in your home?: No     Do problems getting child care make it difficult for you to work or study?: No     Do you have a high school degree?: Yes     Do you have a job?: Yes     How often does this describe you? I don't have enough money to pay my bills.: Never     Family History   Problem Relation Age of Onset    Breast cancer Mother 54        BRCA negative     Diabetes Maternal Grandmother     Heart disease Father     Prostate cancer Father  Hypotension Neg Hx     Malignant hypertension Neg Hx     Anesthesia problems Neg Hx     Malignant hyperthermia Neg Hx     Pseudochol deficiency Neg Hx      Past Medical History:   Diagnosis Date    Anxiety     controlled per pt    Cholelithiases     Noted on ultrasound, asymptomatic     Delayed emergence from general anesthesia 2010    pacemaker insertion, per patient    DORV (double outlet right ventricle)     GERD (gastroesophageal reflux disease)     well controlled w/med and diet    History of blood transfusion     HPV (human papilloma virus) infection     IUD (intrauterine device) in place     Mirena placed 12/27/10, replaced 12/2015, replaced November 16, 2016    Pacemaker     in abd    Post-operative nausea and vomiting     Sick sinus syndrome (HCC/RAF)      Past Surgical History:   Procedure Laterality Date    CARDIAC CATHETERIZATION      CERVICAL BIOPSY  W/ LOOP ELECTRODE EXCISION  11/16/2016    Also, Mirena IUD replacement as well as excision of vulvar skin    Diaphragmatic plication  2010    ECHOCARDIOGRAPHY TRANSESOPHAGEAL      Exam under anesthesia; hysteroscopy; dilation and curettage; placement of a Mirena intrauterine device 12/27/2010    FONTAN PROCEDURE  1982    Modified    INSERT / REPLACE / REMOVE PACEMAKER  2009    Revise Fontan, Maze procedure    MAZE PROCEDURE       Patient Active Problem List   Diagnosis    CIN II (cervical intraepithelial neoplasia II)    Functional single ventricle    DORV (double outlet right ventricle)    S/P Fontan procedure    Presence of permanent cardiac pacemaker    Pacemaker lead malfunction     Home medications:  Current Outpatient Medications   Medication Instructions    amoxicillin 500 mg tablet     apixaban (ELIQUIS) 5 mg, Oral, 2 times daily    aspirin  81 mg, Oral, Daily    Cholecalciferol (VITAMIN D) 125 MCG (5000 UT) CAPS Take by mouth.    citalopram (CELEXA) 20 mg    clonazePAM 0.5 mg tablet TAKE 1 TABLET(0.5 MG) BY MOUTH AT NIGHT AS NEEDED FOR INSOMNIA    cyanocobalamin (CYANOCOBALAMIN) 1,000 mcg, As needed for    diclofenac Sodium (VOLTAREN) 2 g    fluticasone 50 mcg/act nasal spray     furosemide  (LASIX ) 20 mg, Oral, 3 times daily    ketoconazole 2% cream     levonorgestrel (MIRENA, 52 MG,) 20 mcg/day IUD by Intrauterine route.    lisinopril  (PRINIVIL ,ZESTRIL ) 2.5 mg, Oral, Daily    PANTOPRAZOLE  20 mg DR tablet TAKE 1 TABLET BY MOUTH DAILY AS  NEEDED    potassium chloride  10 MEQ tablet 10 mEq, Oral, Daily    sildenafil  (REVATIO ) 20 mg, Oral, 3 times daily    spironolactone  (ALDACTONE ) 150 mg, Oral, Daily    valacyclovir 500 mg tablet ValACYclovir HCl - 500 MG Oral Tablet    vitamin C 1,000 mg, As needed for    zolpidem (AMBIEN) 2.5 mg, Every night at bedtime PRN      Objective:     Physical Exam:    Vitals:BP 99/57  ~ Pulse 74  ~ Temp 36.3 ?  C (97.3 ?F) (Temporal)  ~ Resp 18  ~ Ht 5' 5'' (1.651 m)  ~ Wt 132 lb 4.4 oz (60 kg)  ~ SpO2 95%  ~ BMI 22.01 kg/m?  Body mass index is 22.01 kg/m?SABRA .  General: Pleasant woman, in NAD.  Skin: No rashes. Sternotomy and left thoracotomy scars well healed.  Head and Neck: Pupils are equal and reacting.   Mouth: Normal oral mucosa. Excellent dentition.  Neck: Jugular venous pressure difficult to estimate.  Chest:  Lungs clear bilaterally   Heart: Single S1 with no systolic murmurs or rubs. No diastolic murmurs or gallops.  Abdomen: Mildly distended, no fluid wave or ascites. Soft, non-tender.  Extremities: No edema on legs.   Neurologic: Alert and oriented x 4.  Psychologic: Normal mood and affect.    Data:  EKG: Ventricular paced rhythm with atrial standstill versus fine atrial fibrillation      Assessment:   We reviewed the ECG and CAM monitor from September and discussed the results with Delon. We feel, based on the ECG today that a cardioversion is unlikely to be of benefit to her symptomatically and may ultimately not be successful in converting her to sonus rhythm based on the EP study from 2024. She has no atrial lead and thus is only currently ventricular pacing. When the pacemaker rate was reduced to 50 bpm, her native rate was a junctional bradycardia with a narrow QRS complex. We discussed lowering the pacemaker rate to 50 bpm and leaving her in a slow junctional rhythm with a narrow QRS complex which may help her cardiac output but due to the majority of her symptoms being with exercise we elected to not do this. We also discussed altering the rate adaptive pacing and seeing if this improves her exercise tolerance.      Plan/Recommendation:   Discharge home  Clinic visit tomorrow at 1030 with Dr Georgina and plan to alter rate response and exercise in clinic to assess response to changes    Author: Lannie MYRTIS Hirschfeld, MBChB 09/01/2024 3:21 PM     I have seen and examined this patient and agree with above. I have discussed this patient at length with Dr. Hirschfeld. I have personally reviewed the patient's cardiovascular status including the most recent echocardiographic and electrocardiographic data and have formulated the above plan. The assessment and plan for the patient's cardiac care has been documented above.    Kamdon Reisig P. Georgina, MD  [facility] Attending Physician

## 2024-09-01 NOTE — Consults
 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    Subjective:   Ann Duran is a 48 y.o. female who was seen in clinic for pacemaker evaluation. He cardiac history is remarkable foir a diagnosis of L-transposition/ single ventricle. Her first cardiac surgery was a Modified RA tPA Fontan procedure at age 22. She required a thoracic duct ligation after her Fontan and then reportedly did well until age 32 when she presented with a complaint of fatigue and was diagnosed with atrial arrhythmias. She then underwent a Fontan conversion in Jan 2009 at which time pacemaker leads were placed but the device was placed 2 weeks later after she developed bradycardia and fluid retention. The atrial lead was non functional and so she was VVI paced. She underwent attempted atrial lead revision with diaphragm plication in April of 2010, but the new atrial lead also failed to function. She was tried on VVI backup pacing but was eventually changed to VVIR at 70 bpm lower rate, which she has tolerated well since 2011, but has had increasing requirement for ventricular pacing.     Tora underwent device replacement on 12-13-2023 without complications. This was done after an EP study showed diffuse atrial scarring with no viable site for atrial pacing and complete heart block.    Interval events: [name] comes in today after cancelled TEE/cardioversion yesterday. She was found to be in junctional rhythm without evidence of previously noted atrial fibrillation. She has otherwise been feeling well.    Past Medical History:  Past Medical History:   Diagnosis Date    Anxiety     controlled per pt    Cholelithiases     Noted on ultrasound, asymptomatic     Delayed emergence from general anesthesia 2010    pacemaker insertion, per patient    DORV (double outlet right ventricle)     GERD (gastroesophageal reflux disease)     well controlled w/med and diet    History of blood transfusion     HPV (human papilloma virus) infection     IUD (intrauterine device) in place     Mirena placed 12/27/10, replaced 12/2015, replaced November 16, 2016    Pacemaker     in abd    Post-operative nausea and vomiting     Sick sinus syndrome (HCC/RAF)      Social History:  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  Negative tobacco use.  Rare EtOH.    Review of Systems:  14 point review of system negative other than as stated above    Medications:  Outpatient Medications Prior to Visit   Medication Sig Dispense Refill    amoxicillin 500 mg tablet       apixaban (ELIQUIS) 5 mg tablet Take 1 tablet (5 mg total) by mouth two (2) times daily. 60 tablet 11    Ascorbic Acid (VITAMIN C) 1000 MG tablet Take 1 tablet (1,000 mg total) by mouth as needed for.      aspirin  81 mg EC tablet Take 1 tablet (81 mg total) by mouth daily. 90 tablet 3    Cholecalciferol (VITAMIN D) 125 MCG (5000 UT) CAPS Take by mouth.      citalopram 20 mg tablet  Take 1 tablet (20 mg total) by mouth.      clonazePAM 0.5 mg tablet TAKE 1 TABLET(0.5 MG) BY MOUTH AT NIGHT AS NEEDED FOR INSOMNIA      cyanocobalamin 1000 mcg tablet Take 1 tablet (1,000 mcg total) by mouth as needed for.      diclofenac Sodium (VOLTAREN) 1% gel Apply 2 g topically.      fluticasone 50 mcg/act nasal spray   0    furosemide  20 mg tablet Take 1 tablet (20 mg total) by mouth three (3) times daily. 270 tablet 3    ketoconazole 2% cream       levonorgestrel (MIRENA, 52 MG,) 20 mcg/day IUD by Intrauterine route.      lisinopril  2.5 mg tablet Take 1 tablet (2.5 mg total) by mouth daily. 90 tablet 3    PANTOPRAZOLE  20 mg DR tablet TAKE 1 TABLET BY MOUTH DAILY AS  NEEDED 90 tablet 3    potassium chloride  10 MEQ tablet Take 1 tablet (10 mEq total) by mouth daily. 90 tablet 3    sildenafil  20 mg tablet Take 1 tablet (20 mg total) by mouth three (3) times daily. 270 tablet 3    spironolactone  50 mg tablet Take 3 tablets (150 mg total) by mouth daily. (Patient taking differently: Take 3 tablets (150 mg total) by mouth three (3) times daily.) 270 tablet 3    valacyclovir 500 mg tablet ValACYclovir HCl - 500 MG Oral Tablet      zolpidem 5 mg tablet Take 0.5 tablets (2.5 mg total) by mouth at bedtime as needed for Sleep.       No facility-administered medications prior to visit.     Allergies:  Allergies   Allergen Reactions    Other          Objective:     Vitals:  There were no vitals taken for this visit. No height and weight on file for this encounter. Facility age limit for growth %iles is 20 years. Facility age limit for growth %iles is 20 years.    General:   alert and no distress   Skin:   warm and dry, no hyperpigmentation, vitiligo, or suspicious lesions   Nodes:   cervical, supraclavicular, and axillary nodes normal.   Eyes:   No conjunctival injection, sclerae anicteric   Oropharynx:  lips, mucosa, and tongue normal; teeth and gums normal   Neck:  no adenopathy, no carotid bruit, no JVD, supple, symmetrical, trachea midline and thyroid not enlarged, symmetric, no tenderness/mass/nodules   Lungs:   clear to auscultation bilaterally   Heart:   regular rate and rhythm, S1: single and S2: normal   Abdomen:   soft, non-tender; bowel sounds normal; no masses,  no organomegaly   Extremities:  extremities normal, atraumatic, no cyanosis or edema   Psychiatric:   normal mood, behavior, speech, dress, and thought processes       Assessment:   48 y.o. female with DILV status post Fontan conversion to extracardiac baffle on 11/21/2007, with subsequent placement of an epicardial pacemaker for sinus node dysfunction.  She had atrial lead failure shortly after initial placement.  In 2010 she underwent an unsuccessful attempt at placement of a new atrial lead. She was found to be in atrial standstill with complete AV block at EP study in 2025, so no attempt to place an additional atrial lead was made.     At this point, the patient continues to be in apparent atrial standstill with junctional escape  rhythm. Her underlying junctional rate is in the 60's today with ongoing VVI pacing at 70 beats per minute with 100% ventricular pacing. We discussed the pros and cons of lowering her rate to 50 today to promote junctional rhythm and preserve ventricular function over the long term.     The patient exercised in the stairwell today in this mode and noted less shortness of breath. Therefore, we have changed her to VVIR 50 beats per minute with a sleep rate (during limited hours to accommodate travel) at 40 beats per minute. The patient will be in [place]  for 3 more days and will let us  know if there are any concerns with this change.     Will plan to continue remote monitoring with a return visit in 1 years time.    Plan/ Recommendation:     Remote monitoring every 3 months.  Follow up in clinic in one year    Venetia SQUIBB. Georgina, MD

## 2024-09-01 NOTE — Updated H&P Note Attestation
 UPDATED H&P REQUIREMENT    WHAT IS THE STATUS OF THE PATIENT'S MOST CURRENT HISTORY AND PHYSICAL?   - The most current H&P was performed within the past 24 hours, and having examined the patient, I attest that there have been no changes to the current H&P. No additional updated H&P documentation is necessary.     REFER TO MEDICAL STAFF POLICIES REGARDING PRE-PROCEDURE HISTORY AND PHYSICAL EXAMINATION AND UPDATED H&P REQUIREMENTS BELOW:    [facility] and [facility] Medical Staff Policy 200 - For Patients Undergoing Procedures Requiring Moderate or Deep Sedation, General Anesthesia or Regional Anesthesia    Contents of a History and Physical Examination (H&P):    The H&P shall consist of chief complaint, history of present illness, allergies and medications, relevant social and family history, past medical history, review of systems and physical examination, and assessment and plan appropriate to the patient's age.    For Patients Undergoing Procedures Requiring Moderate or Deep Sedation, General Anesthesia or Regional Anesthesia:    1. An H&P shall be performed within 24 hours prior to the procedure by a qualified member of the medical staff or designee with appropriate privileges, except as noted in item 2 below.    2. If a complete history and physical was performed within thirty (30) calendar days prior to the patient?s admission to the Medical Center for elective surgery, a member of the medical staff assumes the responsibility for the accuracy of the clinical information and will need to document in the medical record within twenty-four (24) hours of admission and prior to surgery or major invasive procedure, that they either attest that the history and physical has been reviewed and accepted, or document an update of the original history and physical relevant to the patient's current clinical status.    3. Providing an H&P for patients undergoing surgery under local anesthesia is at the discretion of the Attending Physician.     4. When a procedure is performed by a dentist, podiatrist or other practitioner who is not privileged to perform an H&P, the anesthesiologist?s assessment immediately prior to the procedure will constitute the 24 hour re-assessment.The dentist, podiatrist or other practitioner who is not privileged to perform an H&P will document the history and physical relevant to the procedure.    5. If the H&P and the written informed consent for the surgery or procedure are not recorded in the patient's medical record prior to surgery, the operation shall not be performed unless the attending physician states in writing that such a delay could lead to an adverse event or irreversible damage to the patient.    6. The above requirements shall not preclude the rendering of emergency medical or surgical care to a patient in dire circumstances.

## 2024-09-01 NOTE — OR PreOp
 Case cancelled due to Sinus Rhythm on 12 lead EKG. Team will follow up on tomorrow.

## 2024-09-02 ENCOUNTER — Ambulatory Visit: Payer: PRIVATE HEALTH INSURANCE

## 2024-09-02 ENCOUNTER — Inpatient Hospital Stay: Payer: BLUE CROSS/BLUE SHIELD

## 2024-09-02 DIAGNOSIS — R001 Bradycardia, unspecified: Secondary | ICD-10-CM

## 2024-09-02 DIAGNOSIS — Q204 Double inlet ventricle: Secondary | ICD-10-CM

## 2024-09-02 DIAGNOSIS — I495 Sick sinus syndrome: Principal | ICD-10-CM

## 2024-09-02 DIAGNOSIS — I27849 S/P Fontan procedure: Secondary | ICD-10-CM

## 2024-09-02 DIAGNOSIS — Z8774 Personal history of (corrected) congenital malformations of heart and circulatory system: Secondary | ICD-10-CM

## 2024-09-02 DIAGNOSIS — Z950 Presence of cardiac pacemaker: Secondary | ICD-10-CM

## 2024-09-02 NOTE — Procedures
 SEE MEDIA SECTION FOR PROGRAMMER PRINTOUT   [facility] Pacemaker     PATIENT: Ann Duran  MRN: [id number]  DOB: 11/14/1975     DATE OF PROCEDURE: 09/02/2024  TIME OF PROCEDURE: 1030     INDICATIONS: Pacemaker evaluation   PROCEDURE: Pacemaker evaluation     Location: [facility]  # 700     Technician: Jeralyn Row, NP      READING PHYSICIAN:  Dr. Georgina     Device:  Device Manufacturer: Medtronic  Device Model Name: Nelwyn HEWS DR MRI  Model Number: [id number]  Device Serial Number: [id number]  Device Implant Date: 12/13/2023  Indication for Implant:      RA lead: (Non Capturing / OFF)  Manufacturer: Medtronic   Model Number: [id number]  Serial Number: [id number]  Date of Implant: 12/05/2007     RV lead: [facility])  Manufacturer: Medtronic   Model Number: [id number]  Serial Number: [id number]  Date of Implant: 12/05/2007     Battery: 6.9 years     Underlying Rhythm: V rate in 50's   R wave: 19.9mV (per device)     Impedance:  A: 399 ohms  V: 570 ohms     Capture threshold not tested today- results from 07/08/2024  V: 2.0 V @ 1.0 ms      Since 09/01/2024     Tachy episode summary:  VT/VF: 0      VP: 99%     Programmed changes:   Reduced lower rate and added sleep rate to avoid long term RV pacing     Settings:   Mode: VVIR  Rate: Lower/Upper Sensor: 50/150 bpm (sleep rate 40)  RV Output: 3.25 V @ 1.0 ms  RV Sensitivity: 2.0 mV     Assessment  1. 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 S/p Fontan conversion to extracardiac baffle on 11/21/07.  2. Underwent surgical placement of abdominal pacemaker generator on 12/05/07, but atrial lead was non-capturing, now with single site ventricular pacing. Generator change 12/12/2017 by Dr. Belynda. Generator change 12/13/2023 by Dr. Arlyne. Medtronic single chamber pacer with normal function. Chronically high but stable RV thresholds.   3. Patient is not pacemaker dependent.   4. No ventricular high rate episodes.     Plan:  1. Programmed changes:  Reduced lower rate and added sleep rate to avoid long term RV pacing.  2. Walk test in office, patient feeling well with current device settings, no changes made to rate response.   3. Remote monitoring Q3 month and follow up in office in 1 year or sooner for symptoms.       Robynn Goldstein, NP

## 2024-09-07 ENCOUNTER — Inpatient Hospital Stay: Payer: BLUE CROSS/BLUE SHIELD

## 2024-09-07 DIAGNOSIS — Z950 Presence of cardiac pacemaker: Secondary | ICD-10-CM

## 2024-09-07 DIAGNOSIS — Q201 Double outlet right ventricle: Secondary | ICD-10-CM

## 2024-09-07 DIAGNOSIS — Z8774 Personal history of (corrected) congenital malformations of heart and circulatory system: Secondary | ICD-10-CM

## 2024-09-07 DIAGNOSIS — I27849 S/P Fontan procedure: Secondary | ICD-10-CM

## 2024-09-08 NOTE — Procedures
 SEE MEDIA SECTION FOR DOWNLOAD PRINTOUT     Ann Duran is a 48 y.o. female  [id number]  Reading MD: Dr Georgina LABOR Medtronic Carelink remote monitor was received as a patient initiated download. Pt initiated transmission. The device function is normal with decreased V pacing due to lower rate adjustment during her last in office evaluation. No arrhythmia episodes noted. A voicemail was left to assess the reason for the initiated transmission. The pt was asked to call the [facility] office at her earliest convenience. See Media for full report.

## 2024-09-11 ENCOUNTER — Other Ambulatory Visit: Payer: PRIVATE HEALTH INSURANCE

## 2024-09-22 ENCOUNTER — Telehealth: Payer: BLUE CROSS/BLUE SHIELD

## 2024-09-22 NOTE — Telephone Encounter
 Spoke with patient and answered all questions.

## 2024-09-22 NOTE — Telephone Encounter
 Phn ms/Jeremy P. Georgina, MD / [phone]   [phone] / call back request/tThe patient is requesting a call back from [facility] regarding medication questions./ Thank you

## 2024-10-07 ENCOUNTER — Inpatient Hospital Stay: Payer: BLUE CROSS/BLUE SHIELD | Attending: Gastroenterology

## 2024-10-07 ENCOUNTER — Inpatient Hospital Stay: Payer: PRIVATE HEALTH INSURANCE | Attending: Gastroenterology

## 2024-10-07 ENCOUNTER — Ambulatory Visit: Payer: BLUE CROSS/BLUE SHIELD | Attending: Gastroenterology

## 2024-10-07 DIAGNOSIS — R140 Abdominal distension (gaseous): Secondary | ICD-10-CM

## 2024-10-07 DIAGNOSIS — I2784 Liver disease after Fontan procedure: Secondary | ICD-10-CM

## 2024-10-07 DIAGNOSIS — Z8774 Personal history of (corrected) congenital malformations of heart and circulatory system: Secondary | ICD-10-CM

## 2024-10-07 DIAGNOSIS — K219 Gastro-esophageal reflux disease without esophagitis: Secondary | ICD-10-CM

## 2024-10-07 DIAGNOSIS — Z1211 Encounter for screening for malignant neoplasm of colon: Secondary | ICD-10-CM

## 2024-10-07 MED ORDER — PEG 3350-KCL-NABCB-NACL-NASULF 240 G PO SOLR
0 refills
Start: 2024-10-07 — End: ?

## 2024-10-07 MED ORDER — PEG 3350-KCL-NABCB-NACL-NASULF 240 G PO SOLR
0 refills | 1.00000 days | Status: AC
Start: 2024-10-07 — End: 2024-10-10

## 2024-10-07 NOTE — Addendum Note
 Addended by: Raedyn Klinck MARIE on: 10/07/2024 10:19 AM     Modules accepted: Orders

## 2024-10-07 NOTE — Progress Notes
 GASTROENTEROLOGY  10/07/2024      CONSULTANT: Dr. Mal A. Boyd  REFERRING PHYSICIAN/PCP: Mitchel Pac, DO    Reason for Consultation:   Bloating, GERD, CRC screening     History of Present Illness:   Ann Duran is a 48 y.o. female with a past medical history significant for congential heart disease s/p Fontan procedure, Fontan-associated liver disease, consulted for bloating, GERD.    2000: Approximate onset of GI symptoms, bloating  2007: outside colonoscopy, no report  05/2024: CT abd/pelvis with hepatic congestion, sequelae of portal hypertension including splenomegaly, portosystemic collaterals, small volume ascites; cholelithiasis     10/07/2024: Initial consult for bloating and GERD  Reports bloating, starts in the morning and lasts all day. Tried gluten free diet but didn't improve.   Heartburn and acid reflux for many years, with nighttime worsening. Takes Protonix  (twice a week), occasional improvement. Tums, which helps improve symptoms  Currently gaining weight.    Occasional RUQ abdominal pain, no clear trigger and not associated with meals  Bms 3 times a day, occasional with loose stools. With urgency to have bowel movements after eating.   Denies N/C/V. No rectal bleeding, black tarry stools, or bloody vomiting. No dysphagia or odynophagia.    Past Medical History:     Past Medical History:   Diagnosis Date    Anxiety     controlled per pt    Cholelithiases     Noted on ultrasound, asymptomatic     Delayed emergence from general anesthesia 2010    pacemaker insertion, per patient    DORV (double outlet right ventricle)     GERD (gastroesophageal reflux disease)     well controlled w/med and diet    History of blood transfusion     HPV (human papilloma virus) infection     IUD (intrauterine device) in place     Mirena placed 12/27/10, replaced 12/2015, replaced November 16, 2016    Pacemaker     in abd    Post-operative nausea and vomiting     Sick sinus syndrome (HCC/RAF)        Past Surgical History:     Past Surgical History:   Procedure Laterality Date    CARDIAC CATHETERIZATION      CERVICAL BIOPSY  W/ LOOP ELECTRODE EXCISION  11/16/2016    Also, Mirena IUD replacement as well as excision of vulvar skin    Diaphragmatic plication  2010    ECHOCARDIOGRAPHY TRANSESOPHAGEAL      Exam under anesthesia; hysteroscopy; dilation and curettage; placement of a Mirena intrauterine device  12/27/2010    FONTAN PROCEDURE  1982    Modified    INSERT / REPLACE / REMOVE PACEMAKER  2009    Revise Fontan, Maze procedure    MAZE PROCEDURE         Medications:     Current Outpatient Medications:     amoxicillin 500 mg tablet, , Disp: , Rfl:     apixaban  (ELIQUIS ) 5 mg tablet, Take 1 tablet (5 mg total) by mouth two (2) times daily., Disp: 60 tablet, Rfl: 11    Ascorbic Acid (VITAMIN C) 1000 MG tablet, Take 1 tablet (1,000 mg total) by mouth as needed for., Disp: , Rfl:     aspirin  81 mg EC tablet, Take 1 tablet (81 mg total) by mouth daily., Disp: 90 tablet, Rfl: 3    Cholecalciferol (VITAMIN D) 125 MCG (5000 UT) CAPS, Take by mouth., Disp: , Rfl:     citalopram  20 mg tablet, Take 1 tablet (20 mg total) by mouth., Disp: , Rfl:     clonazePAM 0.5 mg tablet, TAKE 1 TABLET(0.5 MG) BY MOUTH AT NIGHT AS NEEDED FOR INSOMNIA (Patient taking differently: as needed for.), Disp: , Rfl:     diclofenac Sodium (VOLTAREN) 1% gel, Apply 2 g topically. (Patient taking differently: Apply 2 g topically as needed for.), Disp: , Rfl:     furosemide  20 mg tablet, Take 1 tablet (20 mg total) by mouth three (3) times daily., Disp: 270 tablet, Rfl: 3    levonorgestrel (MIRENA, 52 MG,) 20 mcg/day IUD, by Intrauterine route., Disp: , Rfl:     lisinopril  2.5 mg tablet, Take 1 tablet (2.5 mg total) by mouth daily., Disp: 90 tablet, Rfl: 3    PANTOPRAZOLE  20 mg DR tablet, TAKE 1 TABLET BY MOUTH DAILY AS  NEEDED, Disp: 90 tablet, Rfl: 3    polyethylene glycol 240 g solution, Per split prep instructions, Disp: 4000 mL, Rfl: 0    potassium chloride  10 MEQ tablet, Take 1 tablet (10 mEq total) by mouth daily. (Patient taking differently: Take 1 tablet (10 mEq total) by mouth daily. Takes 1 tablet once a week), Disp: 90 tablet, Rfl: 3    sildenafil  20 mg tablet, Take 1 tablet (20 mg total) by mouth three (3) times daily., Disp: 270 tablet, Rfl: 3    spironolactone  50 mg tablet, Take 3 tablets (150 mg total) by mouth daily. (Patient taking differently: Take 1 tablet (50 mg total) by mouth three (3) times daily.), Disp: 270 tablet, Rfl: 3    valacyclovir 500 mg tablet, ValACYclovir HCl - 500 MG Oral Tablet (Patient taking differently: as needed for.), Disp: , Rfl:     zolpidem 5 mg tablet, Take 0.5 tablets (2.5 mg total) by mouth at bedtime as needed for Sleep., Disp: , Rfl:     Allergies:   Other    Family History:     Family History   Problem Relation Age of Onset    Breast cancer Mother 44        BRCA negative     Diabetes Maternal Grandmother     Heart disease Father     Prostate cancer Father     Hypotension Neg Hx     Malignant hypertension Neg Hx     Anesthesia problems Neg Hx     Malignant hyperthermia Neg Hx     Pseudochol deficiency Neg Hx      No FH of GI disorders or malignancy    Social History:     Social History     Tobacco Use    Smoking status: Never    Smokeless tobacco: Never    Tobacco comments:     x10 cigarettes during college    Substance Use Topics    Alcohol use: Not Currently     Alcohol/week: 1.2 oz     Types: 2 Standard drinks or equivalent per week     Comment: sometimes    Drug use: No     Development team, fundraising for [employer]  Single    Review of Systems:   All relevant reviewed; unremarkable other than abovementioned     Physical Examination:   Vital Signs: BP 100/64  ~ Pulse 79  ~ Temp 36.4 ?C (97.5 ?F) (Forehead)  ~ Ht 1.651 m (5' 5'')  ~ Wt 64.4 kg (142 lb)  ~ BMI 23.63 kg/m?   General: NAD, appears comfortable  Head:  NC/AT  Eyes: anicteric conjunctivae, unremarkable eyelids   Abd: soft, nontender, nondistended  Rectal: deferred for colonoscopy   Ext: no cyanosis or clubbing  Neuro: AAOx3, no apparent focal neurological deficits  Skin: no rashes, no jaundice    Laboratory Data:   08/2024: WBC 2.7, ANC 1925. Plt 84. Hgb 11.2. LFTs wnl. TSH wnl    Imaging:   See HPI    Endoscopy:   See HPI    Assessment/Plan:   48 y.o. female with congential heart disease s/p Fontan procedure, Fontan-associated liver disease, consulted for bloating, GERD.    Bloating  GERD  Mild intermittent diarrhea   CRC screening  AC   Pancytopenia   Fontan-associated liver disease     The patient has longstanding GERD symptoms without alarm signs, has intermittently taken PPI and Tums with some response. Since she is due for screening colonoscopy I will also assess for reflux injury including Barrett's esophagus. Will defer Bravo pH testing in the setting of chronic liver disease with portal hypertension and anti coagulation in order to screen for esophageal varices with index EGD; can consider future intranasal pH testing. Will Discuss goal of therapy (lowest effective PPI dose for the shortest duration of time) due to side effects (including low iron, low magnesium, osteoporosis, CKD, dementia, neoplasm) but also risks of uncontrolled GERD. Future consideration for surgical or endoscopic evaluation for fundoplication (laparoscopic versus transoral incisionless/TIF).    Additionally, the patient has had longstanding bloating, thus will evaluate for celiac disease, parasitic infection, H.Pylori, and check calprotectin in light of mild diarrhea for IBD.   -Upper endoscopy and colonoscopy (discuss with your PCP/cardiologist holding Eliquis  2 days prior to procedures)  -Referral to hepatology   -Future intranasal pH metry   -Future breath test for SIBO  -Avoid alcohol, tomatoes, onions, spicy foods, fried foods, fatty foods, mint, carbonated drinks, citrus, caffeine, chocolate  -Use wedge pillow (or bricks under head of bed)  -Avoid eating 3 hours before bedtime  -Avoid large meals and reclining after meals   -Use Tums or Pepcid  (famotidine ) as needed   -Stool testing ordered ([facility])  -X-ray ordered, If constipated take 1 capful of over the counter miralax in 8oz fluid daily    RTC EGD/Colonoscopy    Seen and discussed with attending Dr. Boyd    Author:  Leeroy DOROTHA Plum, MD  10/07/2024 9:14 AM  Gastroenterology Fellow PGY-5    I saw and examined the patient on day of service, and discussed the case with the fellow Dr. Plum and agree with the findings and plan as documented in the note above.   The above plan of care was discussed with the patient.     Signed: Mal LABOR. Boyd 10/07/2024 9:52 AM

## 2024-10-07 NOTE — Patient Instructions
-  Upper endoscopy and colonoscopy   -Referral to hepatology   -Future intranasal pH metry   -Future breath test for SIBO  -Avoid alcohol, tomatoes, onions, spicy foods, fried foods, fatty foods, mint, carbonated drinks, citrus, caffeine, chocolate  -Use wedge pillow (or bricks under head of bed)  -Avoid eating 3 hours before bedtime  -Avoid large meals and reclining after meals   -Use Tums or Pepcid  (famotidine ) as needed   -Stool for H.Pylori ordered ([facility])  -Labs at [address]  -X-ray ordered ([address] If constipated take 1 capful of over the counter miralax in 8oz fluid daily

## 2024-10-09 MED ORDER — PEG 3350-KCL-NABCB-NACL-NASULF 240 G PO SOLR
0 refills | 1.00000 days | Status: AC
Start: 2024-10-09 — End: ?

## 2024-10-15 ENCOUNTER — Telehealth: Payer: PRIVATE HEALTH INSURANCE

## 2024-10-15 ENCOUNTER — Other Ambulatory Visit: Payer: BLUE CROSS/BLUE SHIELD

## 2024-10-15 DIAGNOSIS — K219 Gastro-esophageal reflux disease without esophagitis: Principal | ICD-10-CM

## 2024-10-15 DIAGNOSIS — R140 Abdominal distension (gaseous): Secondary | ICD-10-CM

## 2024-10-15 NOTE — Telephone Encounter
 PDL Call to Clinic    Reason for Call: Pt called in requesting her lab orders to be faxed over to another lab called [facility] at [phone]. Called PDL line and spoke with Donley who will have labs changed to external and fax over. Pt informed.    Appointment Related?  []  Yes  [x]  No     If yes;  Date:  Time:    Call warm transferred to PDL: [x]  Yes  []  No    Call Received by Clinic Representative: Donley    If call not answered/not accepted, call received by Patient Services Representative:

## 2024-11-02 ENCOUNTER — Telehealth: Payer: BLUE CROSS/BLUE SHIELD

## 2024-11-02 NOTE — Telephone Encounter
 Reply by: Mal FELIX Oryn Casanova  Normal celiac panel, calp, HP, O&P. Thanks

## 2024-11-02 NOTE — Telephone Encounter
 Results Request - The patient would like to discuss the results of their recent tests.     1) What type of test(s)? 3 BLOOD TEST LABS    2) When was it performed? 10/15/24    3) Where was it performed? [facility]    If [place], are results available in [facility]?   If outside facility, what is their phone number?     Patient or caller has been notified of the turnaround time of 1-2 business day(s).

## 2024-11-03 ENCOUNTER — Other Ambulatory Visit: Payer: PRIVATE HEALTH INSURANCE

## 2024-11-13 NOTE — Telephone Encounter
 Call Back Request      Reason for call back: pt is asking to please get a call back to go over her results . Pt also stated that she did not get her lab results on her portal yet if we can please release them to her.    Any Symptoms:  []  Yes  [x]  No      If yes, what symptoms are you experiencing:    Duration of symptoms (how long):    Have you taken medication for symptoms (OTC or Rx):      If call was taken outside of clinic hours:    [] Patient or caller has been notified that this message was sent outside of normal clinic hours.     [] Patient or caller has been warm transferred to the physician's answering service. If applicable, patient or caller informed to please call us  back if symptoms progress.  Patient or caller has been notified of the turnaround time of 1-2 business day(s).

## 2024-11-15 NOTE — Telephone Encounter
 Reply by: Mal FELIX Boyd Clarity you updated her right? Thanks     Reply by: Mal FELIX Porsche Noguchi  Normal celiac panel, calp, HP, O&P. Thanks             Signed by Boyd Mal LABOR., MD on 11/02/24 [phone]

## 2024-11-16 ENCOUNTER — Other Ambulatory Visit: Payer: PRIVATE HEALTH INSURANCE

## 2024-11-17 ENCOUNTER — Other Ambulatory Visit: Payer: BLUE CROSS/BLUE SHIELD

## 2024-11-17 NOTE — Telephone Encounter
 Reviewed notes

## 2024-11-18 NOTE — Telephone Encounter
 Reply by: Mal FELIX Boyd  Can we remind her please we did? Thanks

## 2024-11-25 ENCOUNTER — Other Ambulatory Visit: Payer: BLUE CROSS/BLUE SHIELD

## 2024-11-25 DIAGNOSIS — R109 Unspecified abdominal pain: Secondary | ICD-10-CM

## 2024-11-30 ENCOUNTER — Telehealth: Payer: PRIVATE HEALTH INSURANCE

## 2024-11-30 NOTE — Telephone Encounter
 Appointment Accommodation Request      Appointment Type: New    Reason for sooner request: Pt asking if she is able to come in sooner to have her IUD removed. Pt asking for 12/21/24 if not than something sooner than 05/31/25.    Date/Time Requested (If any): 12/21/24    Last seen by MD:     Any Symptoms:  []  Yes  [x]  No      If yes, what symptoms are you experiencing:   Duration of symptoms (how long):     Patient or caller was offered an appointment but declined.    Patient or caller was advised to seek emergency services if conditions are urgent or emergent.    Patient or caller has been notified of the turnaround time of 1-2 business (days).

## 2024-12-01 NOTE — Telephone Encounter
 S/p pt requested to r/s appt with Dr. Vicci in July. Pt requested for dates on either 02/16 or 02/17. Stated that there were none available for either date. Pt asked if she could be seen sooner with any provider and, after checking schedule, I stated that the earliest pt can get in would be 07/13 with Dr. Jama. Pt became frustrated and irritated and chose to keep the original appt.

## 2024-12-02 ENCOUNTER — Other Ambulatory Visit: Payer: BLUE CROSS/BLUE SHIELD

## 2024-12-02 ENCOUNTER — Inpatient Hospital Stay: Payer: PRIVATE HEALTH INSURANCE

## 2024-12-02 DIAGNOSIS — Z8774 Personal history of (corrected) congenital malformations of heart and circulatory system: Secondary | ICD-10-CM

## 2024-12-02 DIAGNOSIS — Z950 Presence of cardiac pacemaker: Secondary | ICD-10-CM

## 2024-12-02 DIAGNOSIS — Q201 Double outlet right ventricle: Secondary | ICD-10-CM

## 2024-12-02 DIAGNOSIS — I27849 S/P Fontan procedure: Secondary | ICD-10-CM

## 2024-12-03 ENCOUNTER — Other Ambulatory Visit: Payer: BLUE CROSS/BLUE SHIELD

## 2024-12-03 NOTE — Procedures
 SEE MEDIA SECTION FOR DOWNLOAD PRINTOUT     Ann Duran is a 49 y.o. female  [id number]  Reading MD: Dr Georgina LABOR Medtronic Carelink remote monitor was received as a routine download. The device function is normal with no recorded events. Continue remote monitoring Q3 months. See Media for full report.
